# Patient Record
Sex: Male | Born: 1950
Health system: Southern US, Community
[De-identification: ages and names within clinical notes are randomized; demographics above are authoritative.]

## PROBLEM LIST (undated history)

## (undated) DIAGNOSIS — K589 Irritable bowel syndrome without diarrhea: Secondary | ICD-10-CM

## (undated) DIAGNOSIS — K219 Gastro-esophageal reflux disease without esophagitis: Secondary | ICD-10-CM

## (undated) DIAGNOSIS — N529 Male erectile dysfunction, unspecified: Secondary | ICD-10-CM

## (undated) DIAGNOSIS — E785 Hyperlipidemia, unspecified: Secondary | ICD-10-CM

## (undated) DIAGNOSIS — H9190 Unspecified hearing loss, unspecified ear: Secondary | ICD-10-CM

## (undated) DIAGNOSIS — I340 Nonrheumatic mitral (valve) insufficiency: Secondary | ICD-10-CM

## (undated) DIAGNOSIS — C801 Malignant (primary) neoplasm, unspecified: Secondary | ICD-10-CM

## (undated) DIAGNOSIS — I341 Nonrheumatic mitral (valve) prolapse: Secondary | ICD-10-CM

## (undated) DIAGNOSIS — Z9889 Other specified postprocedural states: Secondary | ICD-10-CM

## (undated) DIAGNOSIS — M5020 Other cervical disc displacement, unspecified cervical region: Secondary | ICD-10-CM

## (undated) DIAGNOSIS — R911 Solitary pulmonary nodule: Secondary | ICD-10-CM

## (undated) DIAGNOSIS — R011 Cardiac murmur, unspecified: Secondary | ICD-10-CM

## (undated) HISTORY — DX: Gastro-esophageal reflux disease without esophagitis: K21.9

## (undated) HISTORY — DX: Irritable bowel syndrome, unspecified: K58.9

## (undated) HISTORY — DX: Hyperlipidemia, unspecified: E78.5

## (undated) HISTORY — PX: TONSILLECTOMY: SUR1361

## (undated) HISTORY — DX: Unspecified hearing loss, unspecified ear: H91.90

## (undated) HISTORY — DX: Male erectile dysfunction, unspecified: N52.9

## (undated) HISTORY — DX: Nonrheumatic mitral (valve) prolapse: I34.1

## (undated) HISTORY — DX: Nonrheumatic mitral (valve) insufficiency: I34.0

## (undated) HISTORY — DX: Malignant (primary) neoplasm, unspecified: C80.1

---
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
## 1978-02-04 HISTORY — PX: ACHILLES TENDON SURGERY: SHX542

## 1998-05-30 ENCOUNTER — Other Ambulatory Visit: Admission: RE | Admit: 1998-05-30 | Discharge: 1998-05-30 | Payer: Self-pay | Admitting: Internal Medicine

## 2004-02-14 ENCOUNTER — Ambulatory Visit: Payer: Self-pay | Admitting: Internal Medicine

## 2004-02-21 ENCOUNTER — Ambulatory Visit: Payer: Self-pay | Admitting: Internal Medicine

## 2004-08-15 ENCOUNTER — Ambulatory Visit: Payer: Self-pay | Admitting: Internal Medicine

## 2004-08-22 ENCOUNTER — Ambulatory Visit: Payer: Self-pay | Admitting: Internal Medicine

## 2004-09-27 ENCOUNTER — Ambulatory Visit: Payer: Self-pay | Admitting: Internal Medicine

## 2004-11-30 ENCOUNTER — Ambulatory Visit: Payer: Self-pay | Admitting: Internal Medicine

## 2004-12-05 HISTORY — PX: AMPUTATION FINGER / THUMB: SUR24

## 2004-12-22 ENCOUNTER — Emergency Department (HOSPITAL_COMMUNITY): Admission: EM | Admit: 2004-12-22 | Discharge: 2004-12-22 | Payer: Self-pay | Admitting: Emergency Medicine

## 2005-01-04 ENCOUNTER — Ambulatory Visit (HOSPITAL_COMMUNITY): Admission: RE | Admit: 2005-01-04 | Discharge: 2005-01-04 | Payer: Self-pay | Admitting: *Deleted

## 2005-01-18 ENCOUNTER — Ambulatory Visit (HOSPITAL_COMMUNITY): Admission: RE | Admit: 2005-01-18 | Discharge: 2005-01-18 | Payer: Self-pay

## 2005-02-02 ENCOUNTER — Emergency Department (HOSPITAL_COMMUNITY): Admission: EM | Admit: 2005-02-02 | Discharge: 2005-02-02 | Payer: Self-pay | Admitting: Emergency Medicine

## 2005-02-04 HISTORY — PX: PROSTATECTOMY: SHX69

## 2005-07-16 ENCOUNTER — Ambulatory Visit: Payer: Self-pay | Admitting: Internal Medicine

## 2005-07-24 ENCOUNTER — Ambulatory Visit: Payer: Self-pay | Admitting: Internal Medicine

## 2005-10-23 ENCOUNTER — Inpatient Hospital Stay (HOSPITAL_COMMUNITY): Admission: RE | Admit: 2005-10-23 | Discharge: 2005-10-25 | Payer: Self-pay | Admitting: Urology

## 2005-10-23 ENCOUNTER — Encounter (INDEPENDENT_AMBULATORY_CARE_PROVIDER_SITE_OTHER): Payer: Self-pay | Admitting: *Deleted

## 2005-12-06 ENCOUNTER — Ambulatory Visit: Payer: Self-pay | Admitting: Internal Medicine

## 2006-02-04 DIAGNOSIS — M5020 Other cervical disc displacement, unspecified cervical region: Secondary | ICD-10-CM

## 2006-02-04 HISTORY — DX: Other cervical disc displacement, unspecified cervical region: M50.20

## 2006-07-23 ENCOUNTER — Ambulatory Visit: Payer: Self-pay | Admitting: Internal Medicine

## 2006-07-23 LAB — CONVERTED CEMR LAB
ALT: 14 units/L (ref 0–40)
AST: 17 units/L (ref 0–37)
Albumin: 4.2 g/dL (ref 3.5–5.2)
Alkaline Phosphatase: 41 units/L (ref 39–117)
BUN: 11 mg/dL (ref 6–23)
Basophils Absolute: 0 10*3/uL (ref 0.0–0.1)
Basophils Relative: 0.8 % (ref 0.0–1.0)
Bilirubin Urine: NEGATIVE
Bilirubin, Direct: 0.1 mg/dL (ref 0.0–0.3)
CO2: 33 meq/L — ABNORMAL HIGH (ref 19–32)
Calcium: 9.2 mg/dL (ref 8.4–10.5)
Chloride: 108 meq/L (ref 96–112)
Cholesterol: 157 mg/dL (ref 0–200)
Creatinine, Ser: 0.9 mg/dL (ref 0.4–1.5)
Eosinophils Absolute: 0.3 10*3/uL (ref 0.0–0.6)
Eosinophils Relative: 5.5 % — ABNORMAL HIGH (ref 0.0–5.0)
GFR calc Af Amer: 112 mL/min
GFR calc non Af Amer: 92 mL/min
Glucose, Bld: 96 mg/dL (ref 70–99)
HCT: 43.4 % (ref 39.0–52.0)
HDL: 43.2 mg/dL (ref >39.0)
Hemoglobin, Urine: NEGATIVE
Hemoglobin: 14.6 g/dL (ref 13.0–17.0)
Ketones, ur: NEGATIVE mg/dL
LDL Cholesterol: 98 mg/dL (ref 0–99)
Leukocytes, UA: NEGATIVE
Lymphocytes Relative: 33.2 % (ref 12.0–46.0)
MCHC: 33.6 g/dL (ref 30.0–36.0)
MCV: 89.6 fL (ref 78.0–100.0)
Monocytes Absolute: 0.5 10*3/uL (ref 0.2–0.7)
Monocytes Relative: 8.3 % (ref 3.0–11.0)
Neutro Abs: 2.9 10*3/uL (ref 1.4–7.7)
Neutrophils Relative %: 52.2 % (ref 43.0–77.0)
Nitrite: NEGATIVE
PSA: <0.03 ng/mL — ABNORMAL LOW (ref 0.10–4.00)
Platelets: 178 10*3/uL (ref 150–400)
Potassium: 4.5 meq/L (ref 3.5–5.1)
RBC: 4.85 M/uL (ref 4.22–5.81)
RDW: 12.2 % (ref 11.5–14.6)
Sodium: 142 meq/L (ref 135–145)
Specific Gravity, Urine: <=1.005 (ref 1.000–1.03)
TSH: 1.07 microintl units/mL (ref 0.35–5.50)
Total Bilirubin: 0.8 mg/dL (ref 0.3–1.2)
Total CHOL/HDL Ratio: 3.6
Total Protein, Urine: NEGATIVE mg/dL
Total Protein: 6.4 g/dL (ref 6.0–8.3)
Triglycerides: 79 mg/dL (ref 0–149)
Urine Glucose: NEGATIVE mg/dL
Urobilinogen, UA: 0.2 (ref 0.0–1.0)
VLDL: 16 mg/dL (ref 0–40)
WBC: 5.5 10*3/uL (ref 4.5–10.5)
pH: 6.5 (ref 5.0–8.0)

## 2006-07-31 ENCOUNTER — Ambulatory Visit: Payer: Self-pay | Admitting: Internal Medicine

## 2006-10-10 ENCOUNTER — Ambulatory Visit: Payer: Self-pay | Admitting: Internal Medicine

## 2006-12-02 ENCOUNTER — Encounter: Payer: Self-pay | Admitting: Internal Medicine

## 2007-07-21 ENCOUNTER — Ambulatory Visit: Payer: Self-pay | Admitting: Internal Medicine

## 2007-07-21 LAB — CONVERTED CEMR LAB
ALT: 15 units/L (ref 0–53)
AST: 19 units/L (ref 0–37)
Albumin: 4.3 g/dL (ref 3.5–5.2)
Alkaline Phosphatase: 46 units/L (ref 39–117)
BUN: 12 mg/dL (ref 6–23)
Basophils Absolute: 0 10*3/uL (ref 0.0–0.1)
Basophils Relative: 1 % (ref 0.0–1.0)
Bilirubin Urine: NEGATIVE
Bilirubin, Direct: 0.2 mg/dL (ref 0.0–0.3)
CO2: 29 meq/L (ref 19–32)
Calcium: 9.4 mg/dL (ref 8.4–10.5)
Chloride: 106 meq/L (ref 96–112)
Cholesterol: 142 mg/dL (ref 0–200)
Creatinine, Ser: 0.9 mg/dL (ref 0.4–1.5)
Eosinophils Absolute: 0.2 10*3/uL (ref 0.0–0.7)
Eosinophils Relative: 4.5 % (ref 0.0–5.0)
GFR calc Af Amer: 111 mL/min
GFR calc non Af Amer: 92 mL/min
Glucose, Bld: 89 mg/dL (ref 70–99)
HCT: 42.2 % (ref 39.0–52.0)
HDL: 43.4 mg/dL (ref >39.0)
Hemoglobin, Urine: NEGATIVE
Hemoglobin: 14.4 g/dL (ref 13.0–17.0)
Ketones, ur: NEGATIVE mg/dL
LDL Cholesterol: 86 mg/dL (ref 0–99)
Leukocytes, UA: NEGATIVE
Lymphocytes Relative: 31 % (ref 12.0–46.0)
MCHC: 34.1 g/dL (ref 30.0–36.0)
MCV: 89.3 fL (ref 78.0–100.0)
Monocytes Absolute: 0.4 10*3/uL (ref 0.1–1.0)
Monocytes Relative: 8.3 % (ref 3.0–12.0)
Neutro Abs: 2.5 10*3/uL (ref 1.4–7.7)
Neutrophils Relative %: 55.2 % (ref 43.0–77.0)
Nitrite: NEGATIVE
PSA: 0.01 ng/mL — ABNORMAL LOW (ref 0.10–4.00)
Platelets: 176 10*3/uL (ref 150–400)
Potassium: 4 meq/L (ref 3.5–5.1)
RBC: 4.73 M/uL (ref 4.22–5.81)
RDW: 12.9 % (ref 11.5–14.6)
Sodium: 141 meq/L (ref 135–145)
Specific Gravity, Urine: 1.01 (ref 1.000–1.03)
TSH: 0.96 microintl units/mL (ref 0.35–5.50)
Total Bilirubin: 1.3 mg/dL — ABNORMAL HIGH (ref 0.3–1.2)
Total CHOL/HDL Ratio: 3.3
Total Protein, Urine: NEGATIVE mg/dL
Total Protein: 6.5 g/dL (ref 6.0–8.3)
Triglycerides: 63 mg/dL (ref 0–149)
Urine Glucose: NEGATIVE mg/dL
Urobilinogen, UA: 0.2 (ref 0.0–1.0)
VLDL: 13 mg/dL (ref 0–40)
WBC: 4.5 10*3/uL (ref 4.5–10.5)
pH: 6.5 (ref 5.0–8.0)

## 2007-07-28 ENCOUNTER — Ambulatory Visit: Payer: Self-pay | Admitting: Internal Medicine

## 2007-07-28 DIAGNOSIS — Z8546 Personal history of malignant neoplasm of prostate: Secondary | ICD-10-CM | POA: Insufficient documentation

## 2007-07-28 DIAGNOSIS — N529 Male erectile dysfunction, unspecified: Secondary | ICD-10-CM | POA: Insufficient documentation

## 2007-07-28 DIAGNOSIS — K219 Gastro-esophageal reflux disease without esophagitis: Secondary | ICD-10-CM | POA: Insufficient documentation

## 2007-07-28 DIAGNOSIS — E785 Hyperlipidemia, unspecified: Secondary | ICD-10-CM | POA: Insufficient documentation

## 2007-07-28 DIAGNOSIS — J309 Allergic rhinitis, unspecified: Secondary | ICD-10-CM | POA: Insufficient documentation

## 2007-07-28 DIAGNOSIS — Z85828 Personal history of other malignant neoplasm of skin: Secondary | ICD-10-CM | POA: Insufficient documentation

## 2007-11-20 ENCOUNTER — Ambulatory Visit: Payer: Self-pay | Admitting: Internal Medicine

## 2008-01-18 ENCOUNTER — Ambulatory Visit: Payer: Self-pay | Admitting: Internal Medicine

## 2008-07-01 ENCOUNTER — Encounter (INDEPENDENT_AMBULATORY_CARE_PROVIDER_SITE_OTHER): Payer: Self-pay | Admitting: *Deleted

## 2008-07-20 ENCOUNTER — Ambulatory Visit: Payer: Self-pay | Admitting: Internal Medicine

## 2008-07-20 LAB — CONVERTED CEMR LAB
ALT: 14 units/L (ref 0–53)
AST: 21 units/L (ref 0–37)
Albumin: 4.3 g/dL (ref 3.5–5.2)
Alkaline Phosphatase: 44 units/L (ref 39–117)
BUN: 9 mg/dL (ref 6–23)
Basophils Absolute: 0 10*3/uL (ref 0.0–0.1)
Basophils Relative: 0.4 % (ref 0.0–3.0)
Bilirubin Urine: NEGATIVE
Bilirubin, Direct: 0.2 mg/dL (ref 0.0–0.3)
CO2: 33 meq/L — ABNORMAL HIGH (ref 19–32)
Calcium: 9.3 mg/dL (ref 8.4–10.5)
Chloride: 103 meq/L (ref 96–112)
Cholesterol: 145 mg/dL (ref 0–200)
Creatinine, Ser: 0.9 mg/dL (ref 0.4–1.5)
Eosinophils Absolute: 0.2 10*3/uL (ref 0.0–0.7)
Eosinophils Relative: 5.2 % — ABNORMAL HIGH (ref 0.0–5.0)
GFR calc non Af Amer: 91.6 mL/min (ref >60)
Glucose, Bld: 94 mg/dL (ref 70–99)
HCT: 41.4 % (ref 39.0–52.0)
HDL: 45.9 mg/dL (ref >39.00)
Hemoglobin, Urine: NEGATIVE
Hemoglobin: 14.5 g/dL (ref 13.0–17.0)
Ketones, ur: NEGATIVE mg/dL
LDL Cholesterol: 84 mg/dL (ref 0–99)
Leukocytes, UA: NEGATIVE
Lymphocytes Relative: 28.1 % (ref 12.0–46.0)
Lymphs Abs: 1.2 10*3/uL (ref 0.7–4.0)
MCHC: 35 g/dL (ref 30.0–36.0)
MCV: 88.8 fL (ref 78.0–100.0)
Monocytes Absolute: 0.3 10*3/uL (ref 0.1–1.0)
Monocytes Relative: 6.2 % (ref 3.0–12.0)
Neutro Abs: 2.7 10*3/uL (ref 1.4–7.7)
Neutrophils Relative %: 60.1 % (ref 43.0–77.0)
Nitrite: NEGATIVE
PSA: 0.01 ng/mL — ABNORMAL LOW (ref 0.10–4.00)
Platelets: 165 10*3/uL (ref 150.0–400.0)
Potassium: 4.3 meq/L (ref 3.5–5.1)
RBC: 4.66 M/uL (ref 4.22–5.81)
RDW: 12.6 % (ref 11.5–14.6)
Sodium: 144 meq/L (ref 135–145)
Specific Gravity, Urine: <=1.005 (ref 1.000–1.030)
TSH: 1.03 microintl units/mL (ref 0.35–5.50)
Total Bilirubin: 1.3 mg/dL — ABNORMAL HIGH (ref 0.3–1.2)
Total CHOL/HDL Ratio: 3
Total Protein, Urine: NEGATIVE mg/dL
Total Protein: 6.7 g/dL (ref 6.0–8.3)
Triglycerides: 75 mg/dL (ref 0.0–149.0)
Urine Glucose: NEGATIVE mg/dL
Urobilinogen, UA: 0.2 (ref 0.0–1.0)
VLDL: 15 mg/dL (ref 0.0–40.0)
WBC: 4.4 10*3/uL — ABNORMAL LOW (ref 4.5–10.5)
pH: 7.5 (ref 5.0–8.0)

## 2008-07-28 ENCOUNTER — Ambulatory Visit: Payer: Self-pay | Admitting: Internal Medicine

## 2008-11-03 ENCOUNTER — Ambulatory Visit: Payer: Self-pay | Admitting: Internal Medicine

## 2008-12-06 ENCOUNTER — Encounter: Payer: Self-pay | Admitting: Internal Medicine

## 2008-12-23 ENCOUNTER — Encounter: Payer: Self-pay | Admitting: Internal Medicine

## 2009-03-20 ENCOUNTER — Ambulatory Visit: Payer: Self-pay | Admitting: Internal Medicine

## 2009-03-20 DIAGNOSIS — J069 Acute upper respiratory infection, unspecified: Secondary | ICD-10-CM | POA: Insufficient documentation

## 2009-03-23 ENCOUNTER — Telehealth: Payer: Self-pay | Admitting: Internal Medicine

## 2009-03-27 ENCOUNTER — Ambulatory Visit: Payer: Self-pay | Admitting: Internal Medicine

## 2009-03-27 DIAGNOSIS — J019 Acute sinusitis, unspecified: Secondary | ICD-10-CM | POA: Insufficient documentation

## 2009-04-11 ENCOUNTER — Ambulatory Visit: Payer: Self-pay | Admitting: Internal Medicine

## 2009-04-11 DIAGNOSIS — R509 Fever, unspecified: Secondary | ICD-10-CM | POA: Insufficient documentation

## 2009-04-11 DIAGNOSIS — R05 Cough: Secondary | ICD-10-CM

## 2009-04-11 DIAGNOSIS — R059 Cough, unspecified: Secondary | ICD-10-CM | POA: Insufficient documentation

## 2009-04-13 ENCOUNTER — Telehealth: Payer: Self-pay | Admitting: Internal Medicine

## 2009-04-13 LAB — CONVERTED CEMR LAB
ALT: 22 units/L (ref 0–53)
AST: 22 units/L (ref 0–37)
Albumin: 3.9 g/dL (ref 3.5–5.2)
Alkaline Phosphatase: 62 units/L (ref 39–117)
BUN: 8 mg/dL (ref 6–23)
Basophils Absolute: 0 10*3/uL (ref 0.0–0.1)
Basophils Relative: 0.6 % (ref 0.0–3.0)
Bilirubin Urine: NEGATIVE
Bilirubin, Direct: 0.2 mg/dL (ref 0.0–0.3)
CO2: 31 meq/L (ref 19–32)
Calcium: 9.3 mg/dL (ref 8.4–10.5)
Chloride: 106 meq/L (ref 96–112)
Creatinine, Ser: 0.8 mg/dL (ref 0.4–1.5)
Eosinophils Absolute: 0.1 10*3/uL (ref 0.0–0.7)
Eosinophils Relative: 2.8 % (ref 0.0–5.0)
GFR calc non Af Amer: 104.68 mL/min (ref >60)
Glucose, Bld: 94 mg/dL (ref 70–99)
HCT: 39 % (ref 39.0–52.0)
Hemoglobin, Urine: NEGATIVE
Hemoglobin: 13.1 g/dL (ref 13.0–17.0)
Ketones, ur: NEGATIVE mg/dL
Leukocytes, UA: NEGATIVE
Lymphocytes Relative: 22.4 % (ref 12.0–46.0)
Lymphs Abs: 1 10*3/uL (ref 0.7–4.0)
MCHC: 33.6 g/dL (ref 30.0–36.0)
MCV: 89.9 fL (ref 78.0–100.0)
Monocytes Absolute: 0.5 10*3/uL (ref 0.1–1.0)
Monocytes Relative: 11.6 % (ref 3.0–12.0)
Neutro Abs: 2.9 10*3/uL (ref 1.4–7.7)
Neutrophils Relative %: 62.6 % (ref 43.0–77.0)
Nitrite: NEGATIVE
Platelets: 174 10*3/uL (ref 150.0–400.0)
Potassium: 4.2 meq/L (ref 3.5–5.1)
RBC: 4.34 M/uL (ref 4.22–5.81)
RDW: 12 % (ref 11.5–14.6)
Sed Rate: 34 mm/hr — ABNORMAL HIGH (ref 0–22)
Sodium: 144 meq/L (ref 135–145)
Specific Gravity, Urine: 1.01 (ref 1.000–1.030)
TSH: 2.27 microintl units/mL (ref 0.35–5.50)
Total Bilirubin: 0.8 mg/dL (ref 0.3–1.2)
Total Protein, Urine: NEGATIVE mg/dL
Total Protein: 7.2 g/dL (ref 6.0–8.3)
Urine Glucose: NEGATIVE mg/dL
Urobilinogen, UA: 0.2 (ref 0.0–1.0)
WBC: 4.5 10*3/uL (ref 4.5–10.5)
pH: 6 (ref 5.0–8.0)

## 2009-07-17 ENCOUNTER — Ambulatory Visit: Payer: Self-pay | Admitting: Internal Medicine

## 2009-07-17 LAB — CONVERTED CEMR LAB
ALT: 15 units/L (ref 0–53)
AST: 20 units/L (ref 0–37)
Albumin: 4.2 g/dL (ref 3.5–5.2)
Alkaline Phosphatase: 42 units/L (ref 39–117)
BUN: 11 mg/dL (ref 6–23)
Basophils Absolute: 0 10*3/uL (ref 0.0–0.1)
Basophils Relative: 0.6 % (ref 0.0–3.0)
Bilirubin Urine: NEGATIVE
Bilirubin, Direct: 0.1 mg/dL (ref 0.0–0.3)
CO2: 31 meq/L (ref 19–32)
Calcium: 9.2 mg/dL (ref 8.4–10.5)
Chloride: 108 meq/L (ref 96–112)
Cholesterol: 155 mg/dL (ref 0–200)
Creatinine, Ser: 0.9 mg/dL (ref 0.4–1.5)
Eosinophils Absolute: 0.2 10*3/uL (ref 0.0–0.7)
Eosinophils Relative: 4.4 % (ref 0.0–5.0)
GFR calc non Af Amer: 93.7 mL/min (ref >60)
Glucose, Bld: 89 mg/dL (ref 70–99)
HCT: 42.8 % (ref 39.0–52.0)
HDL: 47 mg/dL (ref >39.00)
Hemoglobin, Urine: NEGATIVE
Hemoglobin: 14.7 g/dL (ref 13.0–17.0)
Ketones, ur: NEGATIVE mg/dL
LDL Cholesterol: 95 mg/dL (ref 0–99)
Leukocytes, UA: NEGATIVE
Lymphocytes Relative: 31.3 % (ref 12.0–46.0)
Lymphs Abs: 1.5 10*3/uL (ref 0.7–4.0)
MCHC: 34.3 g/dL (ref 30.0–36.0)
MCV: 88.1 fL (ref 78.0–100.0)
Monocytes Absolute: 0.3 10*3/uL (ref 0.1–1.0)
Monocytes Relative: 6.9 % (ref 3.0–12.0)
Neutro Abs: 2.6 10*3/uL (ref 1.4–7.7)
Neutrophils Relative %: 56.8 % (ref 43.0–77.0)
Nitrite: NEGATIVE
PSA: 0.01 ng/mL — ABNORMAL LOW (ref 0.10–4.00)
Platelets: 158 10*3/uL (ref 150.0–400.0)
Potassium: 4.4 meq/L (ref 3.5–5.1)
RBC: 4.86 M/uL (ref 4.22–5.81)
RDW: 13.6 % (ref 11.5–14.6)
Sodium: 144 meq/L (ref 135–145)
Specific Gravity, Urine: 1.015 (ref 1.000–1.030)
TSH: 1.18 microintl units/mL (ref 0.35–5.50)
Total Bilirubin: 0.8 mg/dL (ref 0.3–1.2)
Total CHOL/HDL Ratio: 3
Total Protein, Urine: NEGATIVE mg/dL
Total Protein: 6.2 g/dL (ref 6.0–8.3)
Triglycerides: 63 mg/dL (ref 0.0–149.0)
Urine Glucose: NEGATIVE mg/dL
Urobilinogen, UA: 0.2 (ref 0.0–1.0)
VLDL: 12.6 mg/dL (ref 0.0–40.0)
WBC: 4.7 10*3/uL (ref 4.5–10.5)
pH: 6.5 (ref 5.0–8.0)

## 2009-07-24 ENCOUNTER — Ambulatory Visit: Payer: Self-pay | Admitting: Internal Medicine

## 2009-10-23 ENCOUNTER — Ambulatory Visit: Payer: Self-pay | Admitting: Internal Medicine

## 2009-12-01 ENCOUNTER — Encounter: Payer: Self-pay | Admitting: Internal Medicine

## 2010-01-16 ENCOUNTER — Ambulatory Visit: Payer: Self-pay | Admitting: Internal Medicine

## 2010-02-24 ENCOUNTER — Encounter: Payer: Self-pay | Admitting: *Deleted

## 2010-03-06 NOTE — Assessment & Plan Note (Signed)
Summary: NOT FEELING WELL/CD   Vital Signs:  Patient profile:   60 year old male Weight:      160 pounds Temp:     98.6 degrees F oral Pulse rate:   88 / minute BP sitting:   90 / 60  (left arm)  Vitals Entered By: Tora Perches (March 20, 2009 10:06 AM) CC: not feeling well Is Patient Diabetic? No   CC:  not feeling well.  History of Present Illness: The patient presents with complaints of sore throat, fever, cough, sinus congestion and drainge of several days duration. Not better with OTC meds. Chest hurts less with coughing. Can't sleep due to cough. Muscle aches are present.  The mucus is colored.   Preventive Screening-Counseling & Management  Alcohol-Tobacco     Smoking Status: never  Current Medications (verified): 1)  Prevacid 30 Mg  Cpdr (Lansoprazole) .... Once Daily 2)  Glycopyrrolate 2 Mg  Tabs (Glycopyrrolate) .... Once Daily 3)  Lovastatin 20 Mg  Tabs (Lovastatin) .... Once Daily 4)  Vitamin D3 1000 Unit  Tabs (Cholecalciferol) .Marland Kitchen.. 1 By Mouth Daily  Allergies: 1)  ! Vicodin (Hydrocodone-Acetaminophen)  Physical Exam  General:  NAD Mouth:  Erythematous throat mucosa and intranasal erythema.  Lungs:  CTA Heart:  RRR Abdomen:  S/NT Skin:  Clear   Impression & Recommendations:  Problem # 1:  UPPER RESPIRATORY INFECTION, ACUTE (ICD-465.9) Assessment New  His updated medication list for this problem includes:    Promethazine-codeine 6.25-10 Mg/4ml Syrp (Promethazine-codeine) .Marland Kitchen... 5-10 ml by mouth q id as needed cough Zpac if worse  Complete Medication List: 1)  Prevacid 30 Mg Cpdr (Lansoprazole) .... Once daily 2)  Glycopyrrolate 2 Mg Tabs (Glycopyrrolate) .... Once daily 3)  Lovastatin 20 Mg Tabs (Lovastatin) .... Once daily 4)  Vitamin D3 1000 Unit Tabs (Cholecalciferol) .Marland Kitchen.. 1 by mouth daily 5)  Zithromax Z-pak 250 Mg Tabs (Azithromycin) .... As dirrected 6)  Promethazine-codeine 6.25-10 Mg/71ml Syrp (Promethazine-codeine) .... 5-10 ml by  mouth q id as needed cough  Patient Instructions: 1)  Use over-the-counter medicines for "cold": Tylenol  650mg  or Advil 400mg  every 6 hours  for fever; Delsym or Robutussin for cough. Mucinex or Mucinex D for congestion. Ricola or Halls for sore throat. Office visit if not better or if worse.  Prescriptions: PROMETHAZINE-CODEINE 6.25-10 MG/5ML SYRP (PROMETHAZINE-CODEINE) 5-10 ml by mouth q id as needed cough  #300 ml x 0   Entered and Authorized by:   Tresa Garter MD   Signed by:   Tresa Garter MD on 03/20/2009   Method used:   Print then Give to Patient   RxID:   [id number] ZITHROMAX Z-PAK 250 MG TABS (AZITHROMYCIN) as dirrected  #1 x 0   Entered and Authorized by:   Tresa Garter MD   Signed by:   Tresa Garter MD on 03/20/2009   Method used:   Print then Give to Patient   RxID:   [id number]

## 2010-03-06 NOTE — Assessment & Plan Note (Signed)
Summary: cont'd cold,cough/leaving triage message also/cd   Vital Signs:  Patient profile:   60 year old male Weight:      159 pounds O2 Sat:      99 % Temp:     98 degrees F oral Pulse rate:   76 / minute BP sitting:   92 / 60  (left arm)  Vitals Entered By: Tora Perches (April 11, 2009 9:27 AM) CC: ongoing cold sx, cough  Is Patient Diabetic? No   CC:  ongoing cold sx and cough .  History of Present Illness: Was doing well on Avelox. Now is having a dry cough and chills w/ a fever in pm; sinus pressure x 2-3 d. No CP, SOB, abd pain or rashes.  Preventive Screening-Counseling & Management  Alcohol-Tobacco     Smoking Status: never  Current Medications (verified): 1)  Prevacid 30 Mg  Cpdr (Lansoprazole) .... Once Daily 2)  Glycopyrrolate 2 Mg  Tabs (Glycopyrrolate) .... Once Daily 3)  Lovastatin 20 Mg  Tabs (Lovastatin) .... Once Daily 4)  Vitamin D3 1000 Unit  Tabs (Cholecalciferol) .Marland Kitchen.. 1 By Mouth Daily  Allergies: 1)  ! Vicodin (Hydrocodone-Acetaminophen)  Past History:  Past Medical History: Last updated: 07/28/2007 Allergic rhinitis Prostate cancer, hx of GERD IBS Skin cancer, hx of basal cell ED Hyperlipidemia  Social History: Married Never Smoked Retired Alcohol use-no  Review of Systems       The patient complains of fever and prolonged cough.  The patient denies anorexia, weight loss, vision loss, hoarseness, chest pain, abdominal pain, hematochezia, genital sores, muscle weakness, and difficulty walking.    Physical Exam  General:  NAD Eyes:  No corneal or conjunctival inflammation noted. EOMI. Perrla Nose:  Erythematous throat mucosa and intranasal erythema.  Lungs:  CTAnormal respiratory effort and no wheezes.   Heart:  RRRno murmur, no gallop, and no rub.   Abdomen:  S/NT no hepatomegaly and no splenomegaly.   Msk:  No deformity or scoliosis noted of thoracic or lumbar spine.   Extremities:  No clubbing, cyanosis, edema, or deformity  noted with normal full range of motion of all joints.   Skin:  Clear Cervical Nodes:  No lymphadenopathy noted Inguinal Nodes:  No significant adenopathy Psych:  Oriented X3.     Impression & Recommendations:  Problem # 1:  FEVER UNSPECIFIED (ICD-780.60) unclear etiol. Blood cx is pending - will watch and use as needed Tylenol. Record T three times a day  Orders: T-Blood Culture [phone]) T-2 View CXR, Same Day (71020.5TC) TLB-Sedimentation Rate (ESR) (85652-ESR) TLB-BMP (Basic Metabolic Panel-BMET) (80048-METABOL) TLB-CBC Platelet - w/Differential (85025-CBCD) TLB-Hepatic/Liver Function Pnl (80076-HEPATIC) TLB-TSH (Thyroid Stimulating Hormone) (84443-TSH) TLB-Udip ONLY (81003-UDIP)  Problem # 2:  COUGH (ICD-786.2) Assessment: New  Orders: T-Blood Culture [id number]) T-2 View CXR, Same Day (71020.5TC) TLB-Sedimentation Rate (ESR) (85652-ESR) TLB-BMP (Basic Metabolic Panel-BMET) (80048-METABOL) TLB-CBC Platelet - w/Differential (85025-CBCD) TLB-Hepatic/Liver Function Pnl (80076-HEPATIC) TLB-TSH (Thyroid Stimulating Hormone) (84443-TSH) TLB-Udip ONLY (81003-UDIP)  Problem # 3:  SINUSITIS, ACUTE (ICD-461.9) just treated Assessment: Comment Only CT sinuses if not better The following medications were removed from the medication list:    Promethazine-codeine 6.25-10 Mg/30ml Syrp (Promethazine-codeine) .Marland Kitchen... 5-10 ml by mouth q id as needed cough  Problem # 4:  UPPER RESPIRATORY INFECTION, ACUTE (ICD-465.9)?? new again Assessment: Comment Only  The following medications were removed from the medication list:    Promethazine-codeine 6.25-10 Mg/59ml Syrp (Promethazine-codeine) .Marland Kitchen... 5-10 ml by mouth q id as needed cough  Complete Medication List: 1)  Prevacid 30 Mg Cpdr (Lansoprazole) .... Once daily 2)  Glycopyrrolate 2 Mg Tabs (Glycopyrrolate) .... Once daily 3)  Lovastatin 20 Mg Tabs (Lovastatin) .... Once daily 4)  Vitamin D3 1000 Unit Tabs (Cholecalciferol) .Marland Kitchen.. 1 by  mouth daily  Patient Instructions: 1)  Call if you are not better in a reasonable amount of time or if worse. 2)  Please schedule a follow-up appointment in 1-2 weeks.

## 2010-03-06 NOTE — Miscellaneous (Signed)
Summary: Health Care POA  Health Care POA   Imported By: Sherian Rein 07/25/2009 15:47:43  _____________________________________________________________________  External Attachment:    Type:   Image     Comment:   External Document

## 2010-03-06 NOTE — Assessment & Plan Note (Signed)
Summary: PHYSICAL--STC   Vital Signs:  Patient profile:   60 year old male Height:      69 inches (175.26 cm) Weight:      155.25 pounds (70.57 kg) BMI:     23.01 O2 Sat:      96 % on Room air Temp:     97.0 degrees F (36.11 degrees C) oral Pulse rate:   69 / minute BP sitting:   122 / 80  (left arm) Cuff size:   regular  Vitals Entered By: Lucious Groves (July 24, 2009 10:29 AM)  O2 Flow:  Room air CC: CPX./kb Is Patient Diabetic? No Pain Assessment Patient in pain? no      Comments Patient needs refills of Prevacid, Glycopyrrolate, and Lovastatin faxed directly to [facility]./kb   CC:  CPX./kb.  History of Present Illness: The patient presents for a wellness examination   Current Medications (verified): 1)  Prevacid 30 Mg  Cpdr (Lansoprazole) .... Once Daily 2)  Glycopyrrolate 2 Mg  Tabs (Glycopyrrolate) .... Once Daily 3)  Lovastatin 20 Mg  Tabs (Lovastatin) .... Once Daily 4)  Vitamin D3 1000 Unit  Tabs (Cholecalciferol) .Marland Kitchen.. 1 By Mouth Daily 5)  Chlorpheniramine Maleate 2mg  Tabs (Otc) .... As Directed As Needed For Allergies  Allergies (verified): 1)  ! Vicodin (Hydrocodone-Acetaminophen)  Past History:  Past Medical History: Last updated: 07/28/2007 Allergic rhinitis Prostate cancer, hx of GERD IBS Skin cancer, hx of basal cell ED Hyperlipidemia  Family History: Last updated: 07/28/2007 Family History High cholesterol  Social History: Last updated: 04/11/2009 Married Never Smoked Retired Alcohol use-no  Review of Systems  The patient denies anorexia, fever, weight loss, weight gain, vision loss, decreased hearing, hoarseness, chest pain, syncope, dyspnea on exertion, peripheral edema, prolonged cough, headaches, hemoptysis, abdominal pain, melena, hematochezia, severe indigestion/heartburn, hematuria, incontinence, genital sores, muscle weakness, suspicious skin lesions, transient blindness, difficulty walking, depression, unusual weight change,  abnormal bleeding, enlarged lymph nodes, angioedema, and testicular masses.    Physical Exam  General:  NAD Head:  Normocephalic and atraumatic without obvious abnormalities. No apparent alopecia or balding. Eyes:  No corneal or conjunctival inflammation noted. EOMI. Perrla Ears:  External ear exam shows no significant lesions or deformities.  Otoscopic examination reveals clear canals, tympanic membranes are intact bilaterally without bulging, retraction, inflammation or discharge. Hearing is grossly normal bilaterally. Nose:  Erythematous throat mucosa and intranasal erythema.  Mouth:  Erythematous throat mucosa and intranasal erythema.  Neck:  No deformities, masses, or tenderness noted. Chest Wall:  No deformities, masses, tenderness or gynecomastia noted. Breasts:  No masses or gynecomastia noted Lungs:  Normal respiratory effort, chest expands symmetrically. Lungs are clear to auscultation, no crackles or wheezes. Heart:  Normal rate and regular rhythm. S1 and S2 normal without gallop, murmur, click, rub or other extra sounds. Abdomen:  Bowel sounds positive,abdomen soft and non-tender without masses, organomegaly or hernias noted. Rectal:  No external abnormalities noted. Normal sphincter tone. No rectal masses or tenderness. Genitalia:  Testes bilaterally descended without nodularity, tenderness or masses. No scrotal masses or lesions. No penis lesions or urethral discharge. Prostate:  absent Msk:  No deformity or scoliosis noted of thoracic or lumbar spine.   Pulses:  R and L carotid,radial,femoral,dorsalis pedis and posterior tibial pulses are full and equal bilaterally Extremities:  No clubbing, cyanosis, edema, or deformity noted with normal full range of motion of all joints.   Neurologic:  No cranial nerve deficits noted. Station and gait are normal. Plantar reflexes are down-going  bilaterally. DTRs are symmetrical throughout. Sensory, motor and coordinative functions appear  intact. Skin:  Intact without suspicious lesions or rashes Cervical Nodes:  No lymphadenopathy noted Inguinal Nodes:  No significant adenopathy Psych:  Cognition and judgment appear intact. Alert and cooperative with normal attention span and concentration. No apparent delusions, illusions, hallucinations   Impression & Recommendations:  Problem # 1:  WELL ADULT EXAM (ICD-V70.0) Assessment New The labs were reviewed with the patient.  Health and age related issues were discussed. Available screening tests and vaccinations were discussed as well. Healthy life style including good diet and execise was discussed.  Orders: EKG w/ Interpretation (93000)  Problem # 2:  PROSTATE CANCER, HX OF (ICD-V10.46) Assessment: Unchanged  Problem # 3:  HYPERLIPIDEMIA (ICD-272.4) Assessment: Unchanged  His updated medication list for this problem includes:    Lovastatin 20 Mg Tabs (Lovastatin) ..... Once daily  Problem # 4:  GERD (ICD-530.81) Assessment: Unchanged  His updated medication list for this problem includes:    Prevacid 30 Mg Cpdr (Lansoprazole) ..... Once daily    Glycopyrrolate 2 Mg Tabs (Glycopyrrolate) ..... Once daily  Complete Medication List: 1)  Prevacid 30 Mg Cpdr (Lansoprazole) .... Once daily 2)  Glycopyrrolate 2 Mg Tabs (Glycopyrrolate) .... Once daily 3)  Lovastatin 20 Mg Tabs (Lovastatin) .... Once daily 4)  Vitamin D3 1000 Unit Tabs (Cholecalciferol) .Marland Kitchen.. 1 by mouth daily 5)  Chlorpheniramine Maleate 2mg  Tabs (otc)  .... As directed as needed for allergies  Patient Instructions: 1)  Please schedule a follow-up appointment in 6 months. 2)  BMP prior to visit, ICD-9: 3)  Lipid Panel prior to visit, ICD-9: 995.20  272.20

## 2010-03-06 NOTE — Assessment & Plan Note (Signed)
Summary: FLU VAC AVP--STC  Nurse Visit   Allergies: 1)  ! Vicodin (Hydrocodone-Acetaminophen)  Orders Added: 1)  Admin 1st Vaccine [90471] 2)  Flu Vaccine 69yrs + [91478] Flu Vaccine Consent Questions     Do you have a history of severe allergic reactions to this vaccine? no    Any prior history of allergic reactions to egg and/or gelatin? no    Do you have a sensitivity to the preservative Thimersol? no    Do you have a past history of Guillan-Barre Syndrome? no    Do you currently have an acute febrile illness? no    Have you ever had a severe reaction to latex? no    Vaccine information given and explained to patient? yes    Are you currently pregnant? no    Lot Number:[id number]   Exp Date:08/04/2010   Site Given  Left Deltoid IM   .lbflu

## 2010-03-06 NOTE — Progress Notes (Signed)
Summary: Schedule Colonoscopy  Phone Note Outgoing Call Call back at Home Phone [phone]   Call placed by: Harlow Mares CMA Duncan Dull),  March 23, 2009 4:20 PM Call placed to: Patient Summary of Call: patient states that everytime his recall comes due he calls and says he is not getting his colonscopy done until 10 years. He states that he is sticking with the 10 year plan and he will not be having his colonoscopy before then.  Initial call taken by: Harlow Mares CMA Duncan Dull),  March 23, 2009 4:23 PM

## 2010-03-06 NOTE — Letter (Signed)
Summary: [facility]  [facility]   Imported By: Lennie Odor 12/20/2009 10:29:10  _____________________________________________________________________  External Attachment:    Type:   Image     Comment:   External Document

## 2010-03-06 NOTE — Assessment & Plan Note (Signed)
Summary: not better--cold symptoms--stc   Vital Signs:  Patient profile:   60 year old male Weight:      156 pounds Temp:     97 degrees F oral Pulse rate:   79 / minute BP sitting:   94 / 58  (left arm)  Vitals Entered By: Tora Perches (March 27, 2009 11:31 AM) CC: pt c/o still being no better---cold sx Is Patient Diabetic? No   CC:  pt c/o still being no better---cold sx.  History of Present Illness: The patient presents with complaints of sore throat, fever, cough, sinus congestion and drainge of several days duration - not better. Not better with OTC meds. Chest hurts with coughing. Can't sleep due to pressure in sinuses. Muscle aches are present.  The nasal mucus is colored.    Preventive Screening-Counseling & Management  Alcohol-Tobacco     Smoking Status: never  Current Medications (verified): 1)  Prevacid 30 Mg  Cpdr (Lansoprazole) .... Once Daily 2)  Glycopyrrolate 2 Mg  Tabs (Glycopyrrolate) .... Once Daily 3)  Lovastatin 20 Mg  Tabs (Lovastatin) .... Once Daily 4)  Vitamin D3 1000 Unit  Tabs (Cholecalciferol) .Marland Kitchen.. 1 By Mouth Daily 5)  Promethazine-Codeine 6.25-10 Mg/61ml Syrp (Promethazine-Codeine) .... 5-10 Ml By Mouth Q Id As Needed Cough  Allergies: 1)  ! Vicodin (Hydrocodone-Acetaminophen)  Past History:  Past Medical History: Last updated: 07/28/2007 Allergic rhinitis Prostate cancer, hx of GERD IBS Skin cancer, hx of basal cell ED Hyperlipidemia  Past Surgical History: Prostatectomy 2007  Achilles tendon prosthesis  Physical Exam  General:  NAD Mouth:  Erythematous throat mucosa and intranasal erythema.  Lungs:  CTA Heart:  RRR Abdomen:  S/NT Skin:  Clear   Impression & Recommendations:  Problem # 1:  SINUSITIS, ACUTE (ICD-461.9) Assessment Deteriorated Low risk of tendonitis/achilles tendon rupture discussed. Stop Rx if pain. His updated medication list for this problem includes:    Promethazine-codeine 6.25-10 Mg/71ml Syrp  (Promethazine-codeine) .Marland Kitchen... 5-10 ml by mouth q id as needed cough    Avelox 400 Mg Tabs (Moxifloxacin hcl) .Marland Kitchen... 1 by mouth once daily x 10 d  Complete Medication List: 1)  Prevacid 30 Mg Cpdr (Lansoprazole) .... Once daily 2)  Glycopyrrolate 2 Mg Tabs (Glycopyrrolate) .... Once daily 3)  Lovastatin 20 Mg Tabs (Lovastatin) .... Once daily 4)  Vitamin D3 1000 Unit Tabs (Cholecalciferol) .Marland Kitchen.. 1 by mouth daily 5)  Promethazine-codeine 6.25-10 Mg/57ml Syrp (Promethazine-codeine) .... 5-10 ml by mouth q id as needed cough 6)  Avelox 400 Mg Tabs (Moxifloxacin hcl) .Marland Kitchen.. 1 by mouth once daily x 10 d  Patient Instructions: 1)  Call if you are not better in a reasonable amount of time or if worse.  2)  Use the Sinus rinse as needed  Prescriptions: AVELOX 400 MG TABS (MOXIFLOXACIN HCL) 1 by mouth once daily x 10 d  #10 x 0   Entered and Authorized by:   Tresa Garter MD   Signed by:   Tresa Garter MD on 03/27/2009   Method used:   Electronically to        [facility]* (retail)       [address] [place], [place]  [address]       Ph: [phone] or [phone]       Fax: [phone]   RxID:   [id number]

## 2010-03-06 NOTE — Progress Notes (Signed)
Summary: results  Phone Note Call from Patient Call back at [facility] Phone [phone]   Summary of Call: calling for test results. Initial call taken by: Lucious Groves,  April 13, 2009 9:46 AM  Follow-up for Phone Call        spoke with pt, and informed him of his labS CXR, and Blood CX, pt states that he is feeling better, no fever, no chills Follow-up by: Sherese Christopher April 13, 2009 11:08 AM  Additional Follow-up for Phone Call Additional follow up Details #1::        Good! Plan - as before - OTC meds prn Additional Follow-up by: Tresa Garter MD,  April 13, 2009 12:58 PM    Additional Follow-up for Phone Call Additional follow up Details #2::    patient notified. Follow-up by: Lucious Groves,  April 13, 2009 1:44 PM

## 2010-06-19 NOTE — Assessment & Plan Note (Signed)
[facility]                           PRIMARY CARE OFFICE NOTE   NAME:[name], [name]                     MRN:          [id number]  DATE:07/31/2006                            DOB:          07/09/1950    The patient is a 60 year old male who presents for a wellness  examination.  Past medical history, family history, social history as  per July 19. 2006 note.  Last year he was diagnosed with prostate cancer  and underwent surgery, also had basal cell carcinoma removed from the  left arm by Dr. Yetta Barre.  He was also diagnosed with a bulged disk on the  cervical spine and treated with physical therapy by Dr. Otelia Sergeant.  Occasional problems with impotence, otherwise doing good.   CURRENT MEDICATIONS:  1. Robinul forte 2 mg one a day p.r.n.  2. Prevacid 30 mg daily.  3. Lovastatin 20 mg daily.  4. Allergy medicines at times.  5. Vitamin B complex.   REVIEW OF SYSTEMS:  No chest pain or shortness of breath, no syncope, no  neurologic complaints.  No urinary leakage.  No cervical pain or  numbness in the upper or lower extremities.  The rest of the 18-point  review of system is negative.   PHYSICAL EXAMINATION:  Blood pressure 123/77, pulse 68, temperature  97.0, weight 262 pounds.  He looks well.  HEENT:  With moist mucosa.  NECK:  Supple, no thyromegaly or bruit.  LUNGS:  Clear to auscultation and percussion, no wheezing or rales.  HEART:  S1-S2, no murmur, no gallop.  ABDOMEN:  Soft, nontender.  No organomegaly, no mass felt.  LOWER EXTREMITIES:  Without edema.  He is alert, oriented, and cooperative.  Denies being depressed.  RECTAL:  Examination not done today.  SKIN:  Examination reveals small mole measuring about 6 mm with a  regular color on the left side of his abdomen.   Labs on July 23, 2006:  CBC normal, CMET normal, cholesterol 157, TSH  normal, PSA less than 0.03.  EKG normal, urinalysis normal.   ASSESSMENT/PLAN:  1. Normal wellness  examination, age-related health issues discussed,      healthy lifestyle discussed.  He had chest x-ray in 2007 prior to      surgery, repeat exam in 12 months.  Colonoscopy up to date.  2. Erectile dysfunction.  I suggest using Viagra.  He can try L-      Arginine  for a supplement.  3. Basal cell carcinoma, left forearm, removed.  4. Mole on the abdominal wall was examined before by Dr. Yetta Barre, he      will follow up with him.  5. Cervical disk herniation C6-7, doing well now, continue with      stretching exercise.  He is on      vitamin B complex, he can take vitamin D3 in the cold season 1000      units daily.  6. Dyslipidemia on therapy.     Brendan Quint. Plotnikov, MD  Electronically Signed    AVP/MedQ  DD: 07/31/2006  DT: 07/31/2006  Job #: [id number]  cc:   Brendan Purpura, MD

## 2010-06-22 NOTE — H&P (Signed)
NAMEHOPE, HOLST NO.:  [id number]   MEDICAL RECORD NO.:  [id number]          PATIENT TYPE:  INP   LOCATION:  NA                           FACILITY:  [facility]   PHYSICIAN:  Heloise Purpura, MD      DATE OF BIRTH:  1950-11-16   DATE OF ADMISSION:  DATE OF DISCHARGE:                                HISTORY & PHYSICAL   CHIEF COMPLAINT:  Prostate cancer.   HISTORY:  Mr. Hornbaker is a 60 year old gentleman with clinical stage T1C  prostate cancer with a PSA of 4.62 and Gleason score of 3+3=6.  He underwent  a prostate biopsy on August 27, 2005.  Prostate gland was measured at 38.4 mL.  His biopsy demonstrated 10% of one core on the right side to be involved  with adenocarcinoma.  After discussing options regarding management for  clinically localized prostate cancer, the patient elected to proceed with  surgical therapy and a robotic-assisted laparoscopic radical prostatectomy.   PAST MEDICAL HISTORY:  1. History of severed tongue in 1960.  2. Ankle fracture in 1965.  3. Torn Achilles tendon in 1981.  4. Upper extremity infection after an arteriogram requiring antibiotics,      including Zyvox and vancomycin, for a resistant infection.  5. Irritable bowel syndrome.  6. Dyslipidemia.  7. Gastroesophageal reflux disease.   PAST SURGICAL HISTORY:  None.   MEDICATIONS:  1. Prevacid.  2. Lovastatin.  3. Robinul Forte.   ALLERGIES:  No known drug allergies.   FAMILY HISTORY:  Significant for coronary artery disease and hypertension.  There is no history of prostate cancer or GU malignancy.   SOCIAL HISTORY:  The patient is married with 2 children.  He is a retired  Research scientist (life sciences).  He denies tobacco use.  He drinks alcohol  occasionally.   PHYSICAL EXAMINATION:  CONSTITUTIONAL:  The patient is a  well-nourished,  well-developed, age-appropriate male in no acute distress.  CARDIOVASCULAR:  Regular rate and rhythm without obvious murmurs.  LUNGS:   Clear bilaterally.  ABDOMEN:  Soft, nontender, nondistended, without abdominal masses or bruits.  DRE:  No nodularity or induration.   IMPRESSION:  Low-risk clinically localized prostate cancer.   PLAN:  Mr. Vanderveen will undergo a robotic-assisted laparoscopic radical  prostatectomy and then be admitted to the hospital for routine postoperative  care.           ______________________________  Heloise Purpura, MD  Electronically Signed     LB/MEDQ  D:  10/23/2005  T:  10/24/2005  Job:  [id number]

## 2010-06-22 NOTE — Discharge Summary (Signed)
NAMEFREDERICK, Brendan [name] NO.:  [id number]   MEDICAL RECORD NO.:  [id number]          PATIENT TYPE:  INP   LOCATION:  1408                         FACILITY:  [facility]   PHYSICIAN:  Heloise Purpura, MD      DATE OF BIRTH:  1950/11/13   DATE OF ADMISSION:  10/23/2005  DATE OF DISCHARGE:  10/25/2005                                 DISCHARGE SUMMARY   ADMISSION DIAGNOSIS:  Prostate cancer.   DISCHARGE DIAGNOSIS:  Prostate cancer.   PROCEDURES.:  Robotic-assisted laparoscopic radical prostatectomy.   HISTORY:  For the full details of history and physical, please see admission  history and physical.  Brendan [name] is a 60 year old gentleman who has  clinical stage T1-2 prostate cancer, with a PSA of 4.62 and Gleason score of  3 + 3 = 6.  After discussion regarding management options for clinically  localized adenocarcinoma of the prostate, he elected to proceed with  surgical therapy.   HOSPITAL COURSE:  On October 23, 2005, the patient was taken to the  operating room and underwent a robotic-assisted laparoscopic radical  prostatectomy.  He tolerated this procedure well without complications.  Postoperatively, he was able to be transferred to a regular hospital room  following recovery from anesthesia.  He was able to begin ambulating the  evening of surgery.   On postoperative day #1, he was begun on a clear liquid diet, which he  tolerated without difficulty and was able to be transitioned to oral pain  medication.  He was able to increase his ambulation.  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  Therefore, due to his  possible colonization with resistant organisms, he was administered  perioperative vancomycin.  He began having a subjective feeling of chills  and overall feeling poorly on the evening of postoperative day #1.  He  related this possibly to the  administration of his IV antibiotics, as he had  a similar feeling in the past with vancomycin; although he thought this was  likely related to the actual infection.  The vancomycin was therefore  discontinued and he was monitored closely on the evening of postoperative  day #1.  His white blood count was within normal limits.   By postoperative  day #2, he remained afebrile and was feeling much improved  without any subjective feeling of chills.  He was therefore able to be  discharged home in excellent condition.  In addition, he maintained  excellent output from his Foley catheter, with minimal output from his  pelvic drain; and therefore his pelvic drain was removed.   DISPOSITION:  Home.   DISCHARGE MEDICATIONS:  The patient was instructed to resume his regular  home medications, except any aspirin, nonsteroidal anti-inflammatory drugs,  or herbal supplements.  He was given a prescription to take Vicodin as  needed for pain and Cipro prior to his return visit for Foley catheter  removal.   DISCHARGE INSTRUCTIONS:  The patient was instructed to be ambulatory, but  specifically instructed to refrain from any  heavy lifting or strenuous  activity.  He was told to gradually advance his diet once passing flatus.  He is instructed on routine Foley catheter care and given a leg bag for  daytime usage.   FOLLOW UP:  Brendan [name] will follow up in 1 week with Dr. Retta Diones for  removal of his Foley catheter and for staple removal.  As I am out of town  next week, I have told Brendan [name] that I do not want him to have to wait  longer to have his catheter removed.  I will then plan to see him back a few  weeks later to assess his overall progress in recovery.           ______________________________  Heloise Purpura, MD  Electronically Signed     LB/MEDQ  D:  10/25/2005  T:  10/27/2005  Job:  [id number]   cc:   Georgina Quint. Plotnikov, MD  [address]  [place]  [place] [address]    Bertram Millard. Dahlstedt, M.D.  Fax: [phone]

## 2010-06-22 NOTE — Op Note (Signed)
Brendan [name], Brendan [name] NO.:  [id number]   MEDICAL RECORD NO.:  [id number]          PATIENT TYPE:  INP   LOCATION:  0007                         FACILITY:  [facility]   PHYSICIAN:  Heloise Purpura, MD      DATE OF BIRTH:  [birth year]   DATE OF PROCEDURE:  10/23/2005  DATE OF DISCHARGE:                                 OPERATIVE REPORT   PREOPERATIVE DIAGNOSIS:  Clinically localized adenocarcinoma of prostate.   POSTOPERATIVE DIAGNOSIS:  Clinically localized adenocarcinoma of prostate.   PROCEDURE:  Robotic assisted laparoscopic radical prostatectomy (bilateral  nerve sparing).   SURGEON:  Crecencio Mc, M.D.   ASSISTANT:  Cornelious Bryant, MD.   ANESTHESIA:  General.   COMPLICATIONS:  None.   ESTIMATED BLOOD LOSS:  150 mL.   INTRAVENOUS FLUIDS:  1700 mL of lactated Ringer's.   SPECIMENS:  Prostate and seminal vesicles.   DRAINS:  1. #19 Blake pelvic drain.  2. 20-French coude catheter.   INDICATIONS FOR PROCEDURE:  Brendan [name] is a 60 year old gentleman with  clinical stage T1C prostate cancer with a PSA of 4.62 and Gleason score of 3  +3 = 6.  History pretreatment IIEF score was 24 with an IPSS score of 11.  After discussing management options for clinically localized prostate  cancer, he elected to proceed with surgical therapy and the above procedure.  Potential risks and benefits were discussed with the patient and he  consented.   DESCRIPTION OF PROCEDURE:  The patient was taken to the operating room and a  general anesthetic was administered.  He was given preoperative antibiotics,  placed in the dorsal lithotomy position, prepped and draped in the usual  sterile fashion.  Next, a preoperative time-out was performed.  A Foley  catheter was then inserted into the bladder.  A site was selected 18 cm from  the pubic symphysis and just to the left of the umbilicus for placement of  the camera port.  This was placed using a standard open Hassan technique.  This allowed entry into the peritoneal cavity under direct vision.  A 12 mm  port was then placed and a pneumoperitoneum established.  The 0 degrees lens  was then used to inspect the abdomen and there was no evidence of any intra-  abdominal injuries or other abnormalities except for some adhesions between  the sigmoid colon and the left lower quadrant abdominal wall.  The remaining  ports were placed.  We placed 8 mm robotic ports bilaterally 16 cm from the  pubic symphysis and 10 cm to either side of the camera port.  An additional  8 mm robotic port was placed in the far left lateral abdominal wall.  A 5 mm  port was placed between the camera port and the right robotic port.  An  additional 12 mm port was placed in the far right lateral abdominal wall.  All ports were placed under direct vision and without difficulty.  The  surgical cart was then docked.  With the aid of the cautery scissors, the  aforementioned adhesions between the sigmoid colon and the left lower  quadrant abdominal wall were taken down with cold sharp dissection.  This  allowed the colon to be reflected posteriorly and out of the way.  The  bladder was then reflected posteriorly with the aid of the cautery scissors  and the space of Retzius was entered.  This allowed visualization of the  prostate and endopelvic fascia.  The endopelvic fascia was then incised from  the apex back to the base of the prostate bilaterally and the underlying  levator muscle fibers were swept laterally off the prostate thereby  isolating the dorsal venous complex.  The dorsal venous complex was then  stapled and divided with the 45 mm flex ETS stapler.  The bladder neck was  then identified with the aid of Foley catheter manipulation and entered  anteriorly.  The Foley catheter balloon was visualized and the Foley  catheter balloon was deflated.  The catheter was then brought into the  operative field and used to retract the prostate  anteriorly.  This exposed  the posterior bladder neck which was then divided.  Dissection continued  posteriorly until the vasa deferentia and seminal vesicles were identified.  The vasa deferentia were isolated and divided.  Seminal vesicles were also  isolated, dissected free and lifted anteriorly after controlling the seminal  vesicle arterial blood supply.  The space between Denonvilliers' fascia and  the anterior rectum was then bluntly developed thereby isolating the  vascular pedicles of the prostate.  The lateral prostatic fascia was incised  bilaterally allowing the neurovascular bundles to be swept laterally and  posteriorly off the prostate.  The vascular pedicles of the prostate were  then ligated with Hem-o-lok clips and sharply divided above the level of the  neurovascular bundle.  The neurovascular bundles were then swept off the  prostate out to the apex and off the urethra.  The urethra was then sharply  divided allowing the prostate specimen to be disarticulated and placed up  into the abdomen for later removal.  The pelvis was then copiously irrigated  and hemostasis was ensured.  With irrigation in the pelvis, air was injected  into the rectal catheter and there was no evidence of a rectal injury.  Attention then turned to the urethral anastomosis.  A double-armed 3-0  Monocryl suture was used to perform a 360 degrees running tension-free  anastomosis between the urethra and bladder neck.  A new 20-French coude  catheter was then inserted into the bladder and irrigated.  The anastomosis  appeared to be watertight and there were no blood clots within the bladder.  A #19 Blake drain was then brought through the left robotic port and  appropriately positioned in the pelvis.  It was secured to the skin with a  nylon suture.  The surgical cart was then undocked.  The Endopouch retrieval  bag was then used to retrieve the prostate specimen via the periumbilical port site.  A 0  Vicryl suture was used to close the right lateral 12 mm port  site with the aid of a suture passer device.  All remaining ports were then  removed under direct vision.  The periumbilical port site was closed with a  running 0 Vicryl suture.  All port sites were injected with 0.25% Marcaine  and reapproximated at the skin level with staples.  The patient appeared to  tolerate the procedure well without complications.  He was able to be  extubated and transferred to the recovery unit in satisfactory condition.  ______________________________  Heloise Purpura, MD  Electronically Signed     LB/MEDQ  D:  10/23/2005  T:  10/25/2005  Job:  [id number]

## 2010-07-19 ENCOUNTER — Other Ambulatory Visit: Payer: Self-pay | Admitting: Internal Medicine

## 2010-07-19 ENCOUNTER — Other Ambulatory Visit (INDEPENDENT_AMBULATORY_CARE_PROVIDER_SITE_OTHER): Payer: Self-pay

## 2010-07-19 DIAGNOSIS — Z0389 Encounter for observation for other suspected diseases and conditions ruled out: Secondary | ICD-10-CM

## 2010-07-19 DIAGNOSIS — Z0000 Encounter for general adult medical examination without abnormal findings: Secondary | ICD-10-CM

## 2010-07-19 LAB — LIPID PANEL
Cholesterol: 164 mg/dL (ref 0–200)
HDL: 50.9 mg/dL (ref >39.00)
LDL Cholesterol: 102 mg/dL — ABNORMAL HIGH (ref 0–99)
Total CHOL/HDL Ratio: 3
Triglycerides: 55 mg/dL (ref 0.0–149.0)
VLDL: 11 mg/dL (ref 0.0–40.0)

## 2010-07-19 LAB — BASIC METABOLIC PANEL
BUN: 14 mg/dL (ref 6–23)
CO2: 31 mEq/L (ref 19–32)
Chloride: 106 mEq/L (ref 96–112)
Creatinine, Ser: 0.8 mg/dL (ref 0.4–1.5)
GFR: 101.31 mL/min (ref >60.00)
Glucose, Bld: 85 mg/dL (ref 70–99)
Potassium: 4.8 mEq/L (ref 3.5–5.1)
Sodium: 143 mEq/L (ref 135–145)

## 2010-07-19 LAB — CBC WITH DIFFERENTIAL/PLATELET
Basophils Absolute: 0 10*3/uL (ref 0.0–0.1)
Basophils Relative: 0.4 % (ref 0.0–3.0)
Eosinophils Absolute: 0.2 10*3/uL (ref 0.0–0.7)
Eosinophils Relative: 4.4 % (ref 0.0–5.0)
HCT: 43.2 % (ref 39.0–52.0)
Hemoglobin: 14.8 g/dL (ref 13.0–17.0)
Lymphocytes Relative: 28.6 % (ref 12.0–46.0)
Lymphs Abs: 1.4 10*3/uL (ref 0.7–4.0)
MCV: 88.1 fl (ref 78.0–100.0)
Monocytes Absolute: 0.3 10*3/uL (ref 0.1–1.0)
Monocytes Relative: 6.1 % (ref 3.0–12.0)
Neutro Abs: 3 10*3/uL (ref 1.4–7.7)
Neutrophils Relative %: 60.5 % (ref 43.0–77.0)
Platelets: 174 10*3/uL (ref 150.0–400.0)
RBC: 4.9 Mil/uL (ref 4.22–5.81)
WBC: 4.9 10*3/uL (ref 4.5–10.5)

## 2010-07-19 LAB — URINALYSIS
Ketones, ur: NEGATIVE
Leukocytes, UA: NEGATIVE
Nitrite: NEGATIVE
Specific Gravity, Urine: 1.015 (ref 1.000–1.030)
Urine Glucose: NEGATIVE
Urobilinogen, UA: 1 (ref 0.0–1.0)

## 2010-07-19 LAB — HEPATIC FUNCTION PANEL
ALT: 15 U/L (ref 0–53)
AST: 19 U/L (ref 0–37)
Albumin: 4.7 g/dL (ref 3.5–5.2)
Bilirubin, Direct: 0.2 mg/dL (ref 0.0–0.3)
Total Bilirubin: 0.9 mg/dL (ref 0.3–1.2)
Total Protein: 7.1 g/dL (ref 6.0–8.3)

## 2010-07-19 LAB — PSA: PSA: 0 ng/mL — ABNORMAL LOW (ref 0.10–4.00)

## 2010-07-19 LAB — TSH: TSH: 1.27 u[IU]/mL (ref 0.35–5.50)

## 2010-07-24 ENCOUNTER — Encounter: Payer: Self-pay | Admitting: Internal Medicine

## 2010-07-25 ENCOUNTER — Encounter: Payer: Self-pay | Admitting: Internal Medicine

## 2010-07-26 ENCOUNTER — Ambulatory Visit (INDEPENDENT_AMBULATORY_CARE_PROVIDER_SITE_OTHER): Payer: Federal, State, Local not specified - PPO | Admitting: Internal Medicine

## 2010-07-26 ENCOUNTER — Encounter: Payer: Self-pay | Admitting: Internal Medicine

## 2010-07-26 VITALS — BP 120/70 | HR 80 | Temp 97.7°F | Resp 16 | Ht 69.0 in | Wt 157.0 lb

## 2010-07-26 DIAGNOSIS — Z0000 Encounter for general adult medical examination without abnormal findings: Secondary | ICD-10-CM

## 2010-07-26 MED ORDER — GLYCOPYRROLATE 2 MG PO TABS: 2.0000 mg | ORAL_TABLET | Freq: Every day | ORAL | Status: DC

## 2010-07-26 MED ORDER — LANSOPRAZOLE 30 MG PO CPDR: 30.0000 mg | DELAYED_RELEASE_CAPSULE | Freq: Every day | ORAL | Status: DC

## 2010-07-26 MED ORDER — LOVASTATIN 20 MG PO TABS: 20.0000 mg | ORAL_TABLET | Freq: Every day | ORAL | Status: DC

## 2010-07-26 NOTE — Assessment & Plan Note (Signed)
We discussed age appropriate health related issues, including available/recomended screening tests and vaccinations. We discussed a need for adhering to healthy diet and exercise. Labs/EKG were reviewed/ordered. All questions were answered.   

## 2010-07-26 NOTE — Progress Notes (Signed)
  Subjective:    Patient ID: Brendan [name], male    DOB: 12-09-1950, 60 y.o.   MRN: [id number]  HPI  The patient is here for a wellness exam. The patient has been doing well overall without major physical or psychological issues going on lately  Review of Systems  Constitutional: Negative for appetite change, fatigue and unexpected weight change.  HENT: Negative for nosebleeds, congestion, sore throat, sneezing, trouble swallowing and neck pain.   Eyes: Negative for itching and visual disturbance.  Respiratory: Negative for cough.   Cardiovascular: Negative for chest pain, palpitations and leg swelling.  Gastrointestinal: Negative for nausea, diarrhea, blood in stool and abdominal distention.  Genitourinary: Negative for frequency and hematuria.  Musculoskeletal: Negative for back pain, joint swelling and gait problem.  Skin: Negative for rash.  Neurological: Negative for dizziness, tremors, speech difficulty and weakness.  Psychiatric/Behavioral: Negative for sleep disturbance, dysphoric mood and agitation. The patient is not nervous/anxious.        Objective:   Physical Exam  Constitutional: He is oriented to person, place, and time. He appears well-developed and well-nourished. No distress.  HENT:  Head: Normocephalic and atraumatic.  Right Ear: External ear normal.  Left Ear: External ear normal.  Nose: Nose normal.  Mouth/Throat: Oropharynx is clear and moist. No oropharyngeal exudate.  Eyes: Conjunctivae and EOM are normal. Pupils are equal, round, and reactive to light. Right eye exhibits no discharge. Left eye exhibits no discharge. No scleral icterus.  Neck: Normal range of motion. Neck supple. No JVD present. No tracheal deviation present. No thyromegaly present.  Cardiovascular: Normal rate, regular rhythm, normal heart sounds and intact distal pulses.  Exam reveals no gallop and no friction rub.   No murmur heard. Pulmonary/Chest: Effort normal and breath sounds  normal. No stridor. No respiratory distress. He has no wheezes. He has no rales. He exhibits no tenderness.  Abdominal: Soft. Bowel sounds are normal. He exhibits no distension and no mass. There is no tenderness. There is no rebound and no guarding.  Genitourinary: Penis normal. Guaiac negative stool. No penile tenderness.       Per Urol  Musculoskeletal: Normal range of motion. He exhibits no edema and no tenderness.  Lymphadenopathy:    He has no cervical adenopathy.  Neurological: He is alert and oriented to person, place, and time. He has normal reflexes. No cranial nerve deficit. He exhibits normal muscle tone. Coordination normal.  Skin: Skin is warm and dry. No rash noted. He is not diaphoretic. No erythema. No pallor.  Psychiatric: He has a normal mood and affect. His behavior is normal. Judgment and thought content normal.        Lab Results  Component Value Date   WBC 4.9 07/19/2010   HGB 14.8 07/19/2010   HCT 43.2 07/19/2010   PLT 174.0 07/19/2010   CHOL 164 07/19/2010   TRIG 55.0 07/19/2010   HDL 50.90 07/19/2010   ALT 15 07/19/2010   AST 19 07/19/2010   NA 143 07/19/2010   K 4.8 07/19/2010   CL 106 07/19/2010   CREATININE 0.8 07/19/2010   BUN 14 07/19/2010   CO2 31 07/19/2010   TSH 1.27 07/19/2010   PSA 0.00 Repeated and verified X2.* 07/19/2010     Assessment & Plan:

## 2010-11-20 ENCOUNTER — Ambulatory Visit (INDEPENDENT_AMBULATORY_CARE_PROVIDER_SITE_OTHER): Payer: Federal, State, Local not specified - PPO | Admitting: *Deleted

## 2010-11-20 DIAGNOSIS — Z23 Encounter for immunization: Secondary | ICD-10-CM

## 2011-02-07 ENCOUNTER — Telehealth: Payer: Self-pay | Admitting: *Deleted

## 2011-02-07 DIAGNOSIS — Z0000 Encounter for general adult medical examination without abnormal findings: Secondary | ICD-10-CM

## 2011-02-07 DIAGNOSIS — Z0389 Encounter for observation for other suspected diseases and conditions ruled out: Secondary | ICD-10-CM

## 2011-02-07 NOTE — Telephone Encounter (Signed)
Labs entered for 07/2011 CPE.

## 2011-02-07 NOTE — Telephone Encounter (Signed)
Message copied by Merrilyn Puma on Thu Feb 07, 2011  1:29 PM ------      Message from: COUSIN, SHARON T      Created: Thu Feb 07, 2011  9:29 AM      Regarding: PHYSICALS DATE       Lynford Humphrey

## 2011-07-16 ENCOUNTER — Encounter: Payer: Self-pay | Admitting: Internal Medicine

## 2011-07-16 ENCOUNTER — Other Ambulatory Visit: Payer: Self-pay | Admitting: Internal Medicine

## 2011-07-16 ENCOUNTER — Ambulatory Visit (INDEPENDENT_AMBULATORY_CARE_PROVIDER_SITE_OTHER): Payer: Federal, State, Local not specified - PPO | Admitting: Internal Medicine

## 2011-07-16 VITALS — BP 104/68 | HR 69 | Temp 97.0°F | Ht 69.0 in | Wt 161.2 lb

## 2011-07-16 DIAGNOSIS — H1030 Unspecified acute conjunctivitis, unspecified eye: Secondary | ICD-10-CM

## 2011-07-16 DIAGNOSIS — J309 Allergic rhinitis, unspecified: Secondary | ICD-10-CM

## 2011-07-16 DIAGNOSIS — H1032 Unspecified acute conjunctivitis, left eye: Secondary | ICD-10-CM | POA: Insufficient documentation

## 2011-07-16 MED ORDER — TOBRAMYCIN 0.3 % OP SOLN: 1.0000 [drp] | Freq: Four times a day (QID) | OPHTHALMIC | Status: AC

## 2011-07-16 NOTE — Assessment & Plan Note (Signed)
stable overall by hx and exam, and pt to continue medical treatment as before 

## 2011-07-16 NOTE — Patient Instructions (Signed)
Take all new medications as prescribed Continue all other medications as before Please return in 1 month for your yearly visit, or sooner if needed, with Dr Posey Rea

## 2011-07-16 NOTE — Assessment & Plan Note (Signed)
Mild to mod, for antibx course,  to f/u any worsening symptoms or concerns 

## 2011-07-16 NOTE — Progress Notes (Signed)
Subjective:    Patient ID: Brendan [name], male    DOB: 07-01-1950, 61 y.o.   MRN: [id number]  HPI  Here with acute visit, was to see Dr Posey Rea but yearly appt re-scheduled.  Has ongoing mild seaonal allergies this time of yr; Does have several wks ongoing nasal allergy symptoms with clear congestion, itch and sneeze, without fever, pain, ST, cough or wheezing., but better with OTC antihistamine.  Now with 3-4 days acute onset left eye red, swelling, tearing/d/c and am matting, with feeling warm.  Has 6 grandkids, one of them always ill for some reason it seems.  Pt denies chest pain, increased sob or doe, wheezing, orthopnea, PND, increased LE swelling, palpitations, dizziness or syncope.   Pt denies polydipsia, polyuria.  Pt denies new neurological symptoms such as new headache, or facial or extremity weakness or numbness Past Medical History  Diagnosis Date  . Allergy     rhinitis  . Cancer     prostate.  skin basal cell  . GERD (gastroesophageal reflux disease)   . Hyperlipidemia   . IBS (irritable bowel syndrome)   . ED (erectile dysfunction)    Past Surgical History  Procedure Date  . Prostatectomy 2007  . Achilles tendon surgery     prosthesis    reports that he has never smoked. He does not have any smokeless tobacco history on file. He reports that he does not drink alcohol. His drug history not on file. family history includes Heart disease in his mother; Heart disease (age of onset:70) in his father; and Hyperlipidemia in an unspecified family member. Allergies  Allergen Reactions  . Hydrocodone-Acetaminophen    Current Outpatient Prescriptions on File Prior to Visit  Medication Sig Dispense Refill  . Cholecalciferol 1000 UNITS tablet Take 1,000 Units by mouth daily.        Marland Kitchen glycopyrrolate (ROBINUL) 2 MG tablet Take 1 tablet (2 mg total) by mouth daily.  90 tablet  3  . lovastatin (MEVACOR) 20 MG tablet Take 1 tablet (20 mg total) by mouth daily.  90 tablet  3  . NON  FORMULARY 2 mg as directed.        Marland Kitchen DISCONTD: lansoprazole (PREVACID) 30 MG capsule Take 1 capsule (30 mg total) by mouth daily.  90 capsule  3   Review of Systems Review of Systems  Constitutional: Negative for diaphoresis and unexpected weight change.  HENT: Negative for drooling and tinnitus.   Eyes: Negative for photophobia and visual disturbance.  Respiratory: Negative for choking and stridor.   Gastrointestinal: Negative for vomiting and blood in stool.  Genitourinary: Negative for hematuria and decreased urine volume.  Musculoskeletal: Negative for gait problem.  Skin: Negative for color change and wound.     Objective:   Physical Exam BP 104/68  Pulse 69  Temp(Src) 97 F (36.1 C) (Oral)  Ht 5\' 9"  (1.753 m)  Wt 161 lb 4 oz (73.143 kg)  BMI 23.81 kg/m2  SpO2 96% Physical Exam  VS noted Constitutional: Pt appears well-developed and well-nourished.  HENT: Head: Normocephalic.  Right Ear: External ear normal.  Left Ear: External ear normal.   Left conjucntiva mod erythema, and weepy d/c,  Right conjucntiva clear,  Sinus nontender.  Pharynx mild erythema Eyes: Conjunctivae and EOM are normal. Pupils are equal, round, and reactive to light.  Neck: Normal range of motion. Neck supple.  Cardiovascular: Normal rate and regular rhythm.   Pulmonary/Chest: Effort normal and breath sounds normal.  Neurological: Pt is alert.  No cranial nerve defecit.  Motor/gait intact Skin: Skin is warm. No erythema.  Psychiatric: Pt behavior is normal. Thought content normal. not depressed appearance    Assessment & Plan:

## 2011-07-19 ENCOUNTER — Telehealth: Payer: Self-pay | Admitting: *Deleted

## 2011-07-19 NOTE — Telephone Encounter (Signed)
A user error has taken place:.Marland KitchenMarland Kitchen6/14/13@1 :23pm/LMB

## 2011-07-30 ENCOUNTER — Encounter: Payer: Federal, State, Local not specified - PPO | Admitting: Internal Medicine

## 2011-08-19 ENCOUNTER — Encounter: Payer: Self-pay | Admitting: Internal Medicine

## 2011-08-23 ENCOUNTER — Other Ambulatory Visit (INDEPENDENT_AMBULATORY_CARE_PROVIDER_SITE_OTHER): Payer: Federal, State, Local not specified - PPO

## 2011-08-23 DIAGNOSIS — Z0000 Encounter for general adult medical examination without abnormal findings: Secondary | ICD-10-CM

## 2011-08-23 DIAGNOSIS — Z0389 Encounter for observation for other suspected diseases and conditions ruled out: Secondary | ICD-10-CM

## 2011-08-23 LAB — URINALYSIS, ROUTINE W REFLEX MICROSCOPIC
Bilirubin Urine: NEGATIVE
Ketones, ur: NEGATIVE
Leukocytes, UA: NEGATIVE
Nitrite: NEGATIVE
Specific Gravity, Urine: <=1.005 (ref 1.000–1.030)
Urobilinogen, UA: 0.2 (ref 0.0–1.0)
pH: 7 (ref 5.0–8.0)

## 2011-08-23 LAB — BASIC METABOLIC PANEL
BUN: 14 mg/dL (ref 6–23)
CO2: 32 mEq/L (ref 19–32)
Calcium: 9.5 mg/dL (ref 8.4–10.5)
GFR: 99.55 mL/min (ref >60.00)
Glucose, Bld: 96 mg/dL (ref 70–99)

## 2011-08-23 LAB — HEPATIC FUNCTION PANEL
Albumin: 4.4 g/dL (ref 3.5–5.2)
Alkaline Phosphatase: 47 U/L (ref 39–117)
Total Protein: 6.7 g/dL (ref 6.0–8.3)

## 2011-08-23 LAB — PSA: PSA: 0.01 ng/mL — ABNORMAL LOW (ref 0.10–4.00)

## 2011-09-02 ENCOUNTER — Encounter: Payer: Self-pay | Admitting: Internal Medicine

## 2011-09-02 ENCOUNTER — Other Ambulatory Visit: Payer: Self-pay | Admitting: *Deleted

## 2011-09-02 ENCOUNTER — Ambulatory Visit (INDEPENDENT_AMBULATORY_CARE_PROVIDER_SITE_OTHER): Payer: Federal, State, Local not specified - PPO | Admitting: Internal Medicine

## 2011-09-02 ENCOUNTER — Other Ambulatory Visit (INDEPENDENT_AMBULATORY_CARE_PROVIDER_SITE_OTHER): Payer: Federal, State, Local not specified - PPO

## 2011-09-02 VITALS — BP 130/70 | HR 63 | Temp 97.8°F | Resp 16 | Wt 160.2 lb

## 2011-09-02 DIAGNOSIS — Z0000 Encounter for general adult medical examination without abnormal findings: Secondary | ICD-10-CM

## 2011-09-02 DIAGNOSIS — Z8546 Personal history of malignant neoplasm of prostate: Secondary | ICD-10-CM

## 2011-09-02 DIAGNOSIS — Z2911 Encounter for prophylactic immunotherapy for respiratory syncytial virus (RSV): Secondary | ICD-10-CM

## 2011-09-02 DIAGNOSIS — Z136 Encounter for screening for cardiovascular disorders: Secondary | ICD-10-CM

## 2011-09-02 DIAGNOSIS — Z23 Encounter for immunization: Secondary | ICD-10-CM

## 2011-09-02 DIAGNOSIS — K219 Gastro-esophageal reflux disease without esophagitis: Secondary | ICD-10-CM

## 2011-09-02 LAB — CBC WITH DIFFERENTIAL/PLATELET
Basophils Absolute: 0 10*3/uL (ref 0.0–0.1)
Basophils Relative: 0.4 % (ref 0.0–3.0)
Eosinophils Relative: 3 % (ref 0.0–5.0)
Hemoglobin: 14.4 g/dL (ref 13.0–17.0)
Lymphocytes Relative: 22.9 % (ref 12.0–46.0)
Monocytes Relative: 7.4 % (ref 3.0–12.0)
Neutro Abs: 3.8 10*3/uL (ref 1.4–7.7)
RBC: 4.85 Mil/uL (ref 4.22–5.81)
WBC: 5.8 10*3/uL (ref 4.5–10.5)

## 2011-09-02 LAB — LIPID PANEL
HDL: 56.2 mg/dL (ref >39.00)
LDL Cholesterol: 92 mg/dL (ref 0–99)
Total CHOL/HDL Ratio: 3
VLDL: 13 mg/dL (ref 0.0–40.0)

## 2011-09-02 LAB — TSH: TSH: 1.08 u[IU]/mL (ref 0.35–5.50)

## 2011-09-02 MED ORDER — LOVASTATIN 20 MG PO TABS: 20.0000 mg | ORAL_TABLET | Freq: Every day | ORAL | Status: DC

## 2011-09-02 MED ORDER — LANSOPRAZOLE 30 MG PO CPDR: DELAYED_RELEASE_CAPSULE | ORAL | Status: DC

## 2011-09-02 MED ORDER — GLYCOPYRROLATE 2 MG PO TABS: 2.0000 mg | ORAL_TABLET | Freq: Every day | ORAL | Status: DC

## 2011-09-02 NOTE — Assessment & Plan Note (Signed)
We discussed age appropriate health related issues, including available/recomended screening tests and vaccinations. We discussed a need for adhering to healthy diet and exercise. Labs/EKG were reviewed/ordered. All questions were answered.   

## 2011-09-02 NOTE — Progress Notes (Signed)
Subjective:    Patient ID: Brendan [name], male    DOB: [birth year], 61 y.o.   MRN: [id number]  HPI  The patient is here for a wellness exam. The patient has been doing well overall without major physical or psychological issues going on lately...  BP Readings from Last 3 Encounters:  09/02/11 130/70  07/16/11 104/68  07/26/10 120/70   Wt Readings from Last 3 Encounters:  09/02/11 160 lb 4 oz (72.689 kg)  07/16/11 161 lb 4 oz (73.143 kg)  07/26/10 157 lb (71.215 kg)      Review of Systems  Constitutional: Negative for appetite change, fatigue and unexpected weight change.  HENT: Negative for nosebleeds, congestion, sore throat, sneezing, trouble swallowing and neck pain.   Eyes: Negative for itching and visual disturbance.  Respiratory: Negative for cough.   Cardiovascular: Negative for chest pain, palpitations and leg swelling.  Gastrointestinal: Negative for nausea, diarrhea, blood in stool and abdominal distention.  Genitourinary: Negative for frequency and hematuria.  Musculoskeletal: Negative for back pain, joint swelling and gait problem.  Skin: Negative for rash.  Neurological: Negative for dizziness, tremors, speech difficulty and weakness.  Psychiatric/Behavioral: Negative for disturbed wake/sleep cycle, dysphoric mood and agitation. The patient is not nervous/anxious.        Objective:   Physical Exam  Constitutional: He is oriented to person, place, and time. He appears well-developed and well-nourished. No distress.  HENT:  Head: Normocephalic and atraumatic.  Right Ear: External ear normal.  Left Ear: External ear normal.  Nose: Nose normal.  Mouth/Throat: Oropharynx is clear and moist. No oropharyngeal exudate.  Eyes: Conjunctivae and EOM are normal. Pupils are equal, round, and reactive to light. Right eye exhibits no discharge. Left eye exhibits no discharge. No scleral icterus.  Neck: Normal range of motion. Neck supple. No JVD present. No tracheal  deviation present. No thyromegaly present.  Cardiovascular: Normal rate, regular rhythm, normal heart sounds and intact distal pulses.  Exam reveals no gallop and no friction rub.   No murmur heard. Pulmonary/Chest: Effort normal and breath sounds normal. No stridor. No respiratory distress. He has no wheezes. He has no rales. He exhibits no tenderness.  Abdominal: Soft. Bowel sounds are normal. He exhibits no distension and no mass. There is no tenderness. There is no rebound and no guarding.  Genitourinary: Rectum normal and penis normal. Guaiac negative stool. No penile tenderness.       Absent prostate  Musculoskeletal: Normal range of motion. He exhibits no edema and no tenderness.  Lymphadenopathy:    He has no cervical adenopathy.  Neurological: He is alert and oriented to person, place, and time. He has normal reflexes. No cranial nerve deficit. He exhibits normal muscle tone. Coordination normal.  Skin: Skin is warm and dry. No rash noted. He is not diaphoretic. No erythema. No pallor.  Psychiatric: He has a normal mood and affect. His behavior is normal. Judgment and thought content normal.        Lab Results  Component Value Date   WBC 4.9 07/19/2010   HGB 14.8 07/19/2010   HCT 43.2 07/19/2010   PLT 174.0 07/19/2010   CHOL 164 07/19/2010   TRIG 55.0 07/19/2010   HDL 50.90 07/19/2010   ALT 15 08/23/2011   AST 19 08/23/2011   NA 142 08/23/2011   K 4.5 08/23/2011   CL 103 08/23/2011   CREATININE 0.8 08/23/2011   BUN 14 08/23/2011   CO2 32 08/23/2011   TSH 1.27 07/19/2010  PSA 0.01* 08/23/2011     Assessment & Plan:

## 2011-09-02 NOTE — Assessment & Plan Note (Signed)
Dr Laverle Patter 2007

## 2011-09-02 NOTE — Assessment & Plan Note (Signed)
Continue with current prescription therapy as reflected on the Med list.  

## 2011-09-03 ENCOUNTER — Telehealth: Payer: Self-pay | Admitting: Internal Medicine

## 2011-09-03 NOTE — Telephone Encounter (Signed)
Left detailed mess informing pt of below. Copies mailed.  

## 2011-09-03 NOTE — Telephone Encounter (Signed)
Stacey, please, inform patient that all labs are normal   Please, mail the labs to the patient.    Thx  

## 2011-12-03 ENCOUNTER — Ambulatory Visit (INDEPENDENT_AMBULATORY_CARE_PROVIDER_SITE_OTHER): Payer: Federal, State, Local not specified - PPO

## 2011-12-03 DIAGNOSIS — Z23 Encounter for immunization: Secondary | ICD-10-CM

## 2012-05-15 ENCOUNTER — Telehealth: Payer: Self-pay | Admitting: *Deleted

## 2012-05-15 DIAGNOSIS — Z0000 Encounter for general adult medical examination without abnormal findings: Secondary | ICD-10-CM

## 2012-05-15 DIAGNOSIS — Z125 Encounter for screening for malignant neoplasm of prostate: Secondary | ICD-10-CM

## 2012-05-15 NOTE — Telephone Encounter (Signed)
Message copied by Deatra James on Fri May 15, 2012  2:10 PM ------      Message from: Etheleen Sia      Created: Fri May 15, 2012  8:40 AM      Regarding: LAB       PHYSICAL LABS IN [place] ------

## 2012-05-15 NOTE — Telephone Encounter (Signed)
Received staff msg pt made cpx for June, entering cpx labs...Brendan [name]

## 2012-07-16 ENCOUNTER — Other Ambulatory Visit (INDEPENDENT_AMBULATORY_CARE_PROVIDER_SITE_OTHER): Payer: Federal, State, Local not specified - PPO

## 2012-07-16 DIAGNOSIS — Z0000 Encounter for general adult medical examination without abnormal findings: Secondary | ICD-10-CM

## 2012-07-16 DIAGNOSIS — Z125 Encounter for screening for malignant neoplasm of prostate: Secondary | ICD-10-CM

## 2012-07-16 LAB — CBC WITH DIFFERENTIAL/PLATELET
Basophils Relative: 0.6 % (ref 0.0–3.0)
Eosinophils Absolute: 0.3 10*3/uL (ref 0.0–0.7)
HCT: 45.1 % (ref 39.0–52.0)
Hemoglobin: 15.1 g/dL (ref 13.0–17.0)
Lymphocytes Relative: 29.3 % (ref 12.0–46.0)
Lymphs Abs: 1.5 10*3/uL (ref 0.7–4.0)
MCHC: 33.4 g/dL (ref 30.0–36.0)
Neutro Abs: 2.9 10*3/uL (ref 1.4–7.7)
RBC: 5.06 Mil/uL (ref 4.22–5.81)

## 2012-07-16 LAB — URINALYSIS, ROUTINE W REFLEX MICROSCOPIC
Nitrite: NEGATIVE
RBC / HPF: NONE SEEN (ref 0–?)
Specific Gravity, Urine: 1.01 (ref 1.000–1.030)
Total Protein, Urine: NEGATIVE
Urine Glucose: NEGATIVE
WBC, UA: NONE SEEN (ref 0–?)
pH: 6.5 (ref 5.0–8.0)

## 2012-07-16 LAB — LIPID PANEL
HDL: 47.7 mg/dL (ref >39.00)
Total CHOL/HDL Ratio: 3

## 2012-07-16 LAB — BASIC METABOLIC PANEL
CO2: 32 mEq/L (ref 19–32)
Calcium: 9.3 mg/dL (ref 8.4–10.5)
Chloride: 104 mEq/L (ref 96–112)
Potassium: 4 mEq/L (ref 3.5–5.1)
Sodium: 141 mEq/L (ref 135–145)

## 2012-07-16 LAB — HEPATIC FUNCTION PANEL
AST: 18 U/L (ref 0–37)
Albumin: 4.2 g/dL (ref 3.5–5.2)
Alkaline Phosphatase: 46 U/L (ref 39–117)
Total Protein: 6.4 g/dL (ref 6.0–8.3)

## 2012-07-16 LAB — PSA: PSA: 0 ng/mL — ABNORMAL LOW (ref 0.10–4.00)

## 2012-07-24 ENCOUNTER — Encounter: Payer: Self-pay | Admitting: Internal Medicine

## 2012-07-24 ENCOUNTER — Ambulatory Visit (INDEPENDENT_AMBULATORY_CARE_PROVIDER_SITE_OTHER): Payer: Federal, State, Local not specified - PPO | Admitting: Internal Medicine

## 2012-07-24 VITALS — BP 132/80 | HR 72 | Temp 97.7°F | Resp 16 | Ht 69.0 in | Wt 158.0 lb

## 2012-07-24 DIAGNOSIS — H9113 Presbycusis, bilateral: Secondary | ICD-10-CM

## 2012-07-24 DIAGNOSIS — Z8546 Personal history of malignant neoplasm of prostate: Secondary | ICD-10-CM

## 2012-07-24 DIAGNOSIS — Z0000 Encounter for general adult medical examination without abnormal findings: Secondary | ICD-10-CM

## 2012-07-24 DIAGNOSIS — E785 Hyperlipidemia, unspecified: Secondary | ICD-10-CM

## 2012-07-24 DIAGNOSIS — Z136 Encounter for screening for cardiovascular disorders: Secondary | ICD-10-CM

## 2012-07-24 DIAGNOSIS — H9190 Unspecified hearing loss, unspecified ear: Secondary | ICD-10-CM

## 2012-07-24 DIAGNOSIS — H9110 Presbycusis, unspecified ear: Secondary | ICD-10-CM

## 2012-07-24 MED ORDER — LOVASTATIN 20 MG PO TABS: 20.0000 mg | ORAL_TABLET | Freq: Every day | ORAL | Status: DC

## 2012-07-24 MED ORDER — LANSOPRAZOLE 30 MG PO CPDR: DELAYED_RELEASE_CAPSULE | ORAL | Status: DC

## 2012-07-24 MED ORDER — GLYCOPYRROLATE 2 MG PO TABS: 2.0000 mg | ORAL_TABLET | Freq: Every day | ORAL | Status: DC

## 2012-07-24 NOTE — Progress Notes (Signed)
  Subjective:    HPI  The patient is here for a wellness exam. The patient has been doing well overall without major physical or psychological issues going on lately, except for hearing loss - ?  Review of Systems  Constitutional: Negative for appetite change, fatigue and unexpected weight change.  HENT: Negative for nosebleeds, congestion, sore throat, sneezing, trouble swallowing and neck pain.   Eyes: Negative for itching and visual disturbance.  Respiratory: Negative for cough.   Cardiovascular: Negative for chest pain, palpitations and leg swelling.  Gastrointestinal: Negative for nausea, diarrhea, blood in stool and abdominal distention.  Genitourinary: Negative for frequency and hematuria.  Musculoskeletal: Negative for back pain, joint swelling and gait problem.  Skin: Negative for rash.  Neurological: Negative for dizziness, tremors, speech difficulty and weakness.  Psychiatric/Behavioral: Negative for sleep disturbance, dysphoric mood and agitation. The patient is not nervous/anxious.        Objective:   Physical Exam  Constitutional: He is oriented to person, place, and time. He appears well-developed and well-nourished. No distress.  HENT:  Head: Normocephalic and atraumatic.  Right Ear: External ear normal.  Left Ear: External ear normal.  Nose: Nose normal.  Mouth/Throat: Oropharynx is clear and moist. No oropharyngeal exudate.  Eyes: Conjunctivae and EOM are normal. Pupils are equal, round, and reactive to light. Right eye exhibits no discharge. Left eye exhibits no discharge. No scleral icterus.  Neck: Normal range of motion. Neck supple. No JVD present. No tracheal deviation present. No thyromegaly present.  Cardiovascular: Normal rate, regular rhythm, normal heart sounds and intact distal pulses.  Exam reveals no gallop and no friction rub.   No murmur heard. Pulmonary/Chest: Effort normal and breath sounds normal. No stridor. No respiratory distress. He has no  wheezes. He has no rales. He exhibits no tenderness.  Abdominal: Soft. Bowel sounds are normal. He exhibits no distension and no mass. There is no tenderness. There is no rebound and no guarding.  Genitourinary: Penis normal. Guaiac negative stool. No penile tenderness.  Per Urol  Musculoskeletal: Normal range of motion. He exhibits no edema and no tenderness.  Lymphadenopathy:    He has no cervical adenopathy.  Neurological: He is alert and oriented to person, place, and time. He has normal reflexes. No cranial nerve deficit. He exhibits normal muscle tone. Coordination normal.  Skin: Skin is warm and dry. No rash noted. He is not diaphoretic. No erythema. No pallor.  Psychiatric: He has a normal mood and affect. His behavior is normal. Judgment and thought content normal.        Lab Results  Component Value Date   WBC 5.1 07/16/2012   HGB 15.1 07/16/2012   HCT 45.1 07/16/2012   PLT 182.0 07/16/2012   CHOL 159 07/16/2012   TRIG 89.0 07/16/2012   HDL 47.70 07/16/2012   ALT 16 07/16/2012   AST 18 07/16/2012   NA 141 07/16/2012   K 4.0 07/16/2012   CL 104 07/16/2012   CREATININE 0.8 07/16/2012   BUN 12 07/16/2012   CO2 32 07/16/2012   TSH 1.29 07/16/2012   PSA 0.00* 07/16/2012     Assessment & Plan:

## 2012-07-31 ENCOUNTER — Encounter: Payer: Federal, State, Local not specified - PPO | Admitting: Internal Medicine

## 2012-08-03 DIAGNOSIS — H9110 Presbycusis, unspecified ear: Secondary | ICD-10-CM | POA: Insufficient documentation

## 2012-08-03 NOTE — Assessment & Plan Note (Signed)
Audiol ref 

## 2012-08-03 NOTE — Assessment & Plan Note (Signed)
F/u w/urol

## 2012-08-03 NOTE — Assessment & Plan Note (Signed)
Continue with current prescription therapy as reflected on the Med list.  

## 2012-08-03 NOTE — Assessment & Plan Note (Signed)
We discussed age appropriate health related issues, including available/recomended screening tests and vaccinations. We discussed a need for adhering to healthy diet and exercise. Labs/EKG were reviewed/ordered. All questions were answered.   

## 2012-08-17 ENCOUNTER — Other Ambulatory Visit: Payer: Self-pay | Admitting: *Deleted

## 2012-08-17 DIAGNOSIS — Z136 Encounter for screening for cardiovascular disorders: Secondary | ICD-10-CM

## 2012-08-17 MED ORDER — GLYCOPYRROLATE 2 MG PO TABS: 2.0000 mg | ORAL_TABLET | Freq: Every day | ORAL | Status: DC

## 2012-08-17 MED ORDER — LOVASTATIN 20 MG PO TABS: 20.0000 mg | ORAL_TABLET | Freq: Every day | ORAL | Status: DC

## 2012-08-17 MED ORDER — LANSOPRAZOLE 30 MG PO CPDR: DELAYED_RELEASE_CAPSULE | ORAL | Status: DC

## 2012-09-04 ENCOUNTER — Encounter: Payer: Self-pay | Admitting: Internal Medicine

## 2012-11-04 ENCOUNTER — Ambulatory Visit (INDEPENDENT_AMBULATORY_CARE_PROVIDER_SITE_OTHER): Payer: Federal, State, Local not specified - PPO

## 2012-11-04 DIAGNOSIS — Z23 Encounter for immunization: Secondary | ICD-10-CM

## 2013-01-18 ENCOUNTER — Encounter: Payer: Self-pay | Admitting: Internal Medicine

## 2013-01-18 ENCOUNTER — Ambulatory Visit (INDEPENDENT_AMBULATORY_CARE_PROVIDER_SITE_OTHER): Payer: Federal, State, Local not specified - PPO | Admitting: Internal Medicine

## 2013-01-18 VITALS — BP 114/66 | HR 72 | Temp 96.7°F | Resp 16 | Wt 166.0 lb

## 2013-01-18 DIAGNOSIS — L03317 Cellulitis of buttock: Secondary | ICD-10-CM

## 2013-01-18 DIAGNOSIS — L0231 Cutaneous abscess of buttock: Secondary | ICD-10-CM

## 2013-01-18 MED ORDER — DOXYCYCLINE HYCLATE 100 MG PO TABS: 100.0000 mg | ORAL_TABLET | Freq: Two times a day (BID) | ORAL | Status: DC

## 2013-01-18 NOTE — Progress Notes (Signed)
   Subjective:    HPI C/o L buttock pain x 24 hrs  Review of Systems  Constitutional: Negative for appetite change, fatigue and unexpected weight change.  HENT: Negative for congestion, nosebleeds, sneezing, sore throat and trouble swallowing.   Eyes: Negative for itching and visual disturbance.  Respiratory: Negative for cough.   Cardiovascular: Negative for chest pain, palpitations and leg swelling.  Gastrointestinal: Negative for nausea, diarrhea, blood in stool and abdominal distention.  Genitourinary: Negative for frequency and hematuria.  Musculoskeletal: Negative for back pain, gait problem, joint swelling and neck pain.  Skin: Negative for rash.  Neurological: Negative for dizziness, tremors, speech difficulty and weakness.  Psychiatric/Behavioral: Negative for sleep disturbance, dysphoric mood and agitation. The patient is not nervous/anxious.        Objective:   Physical Exam  Constitutional: He is oriented to person, place, and time. He appears well-developed and well-nourished. No distress.  HENT:  Head: Normocephalic and atraumatic.  Right Ear: External ear normal.  Left Ear: External ear normal.  Nose: Nose normal.  Mouth/Throat: Oropharynx is clear and moist. No oropharyngeal exudate.  Eyes: Conjunctivae and EOM are normal. Pupils are equal, round, and reactive to light. Right eye exhibits no discharge. Left eye exhibits no discharge. No scleral icterus.  Neck: Normal range of motion. Neck supple. No JVD present. No tracheal deviation present. No thyromegaly present.  Cardiovascular: Normal rate, regular rhythm, normal heart sounds and intact distal pulses.  Exam reveals no gallop and no friction rub.   No murmur heard. Pulmonary/Chest: Effort normal and breath sounds normal. No stridor. No respiratory distress. He has no wheezes. He has no rales. He exhibits no tenderness.  Abdominal: Soft. Bowel sounds are normal. He exhibits no distension and no mass. There is no  tenderness. There is no rebound and no guarding.  Genitourinary: Penis normal. Guaiac negative stool. No penile tenderness.  Per Urol  Musculoskeletal: Normal range of motion. He exhibits no edema and no tenderness.  Lymphadenopathy:    He has no cervical adenopathy.  Neurological: He is alert and oriented to person, place, and time. He has normal reflexes. No cranial nerve deficit. He exhibits normal muscle tone. Coordination normal.  Skin: Skin is warm and dry. No rash noted. He is not diaphoretic. No erythema. No pallor.  Psychiatric: He has a normal mood and affect. His behavior is normal. Judgment and thought content normal.  L medial buttock 1 cm infiltrate proximally      Lab Results  Component Value Date   WBC 5.1 07/16/2012   HGB 15.1 07/16/2012   HCT 45.1 07/16/2012   PLT 182.0 07/16/2012   CHOL 159 07/16/2012   TRIG 89.0 07/16/2012   HDL 47.70 07/16/2012   ALT 16 07/16/2012   AST 18 07/16/2012   NA 141 07/16/2012   K 4.0 07/16/2012   CL 104 07/16/2012   CREATININE 0.8 07/16/2012   BUN 12 07/16/2012   CO2 32 07/16/2012   TSH 1.29 07/16/2012   PSA 0.00* 07/16/2012     Assessment & Plan:

## 2013-01-18 NOTE — Assessment & Plan Note (Signed)
12/14 Doxy 100 mg bid x 10 d

## 2013-01-18 NOTE — Progress Notes (Signed)
Pre visit review using our clinic review tool, if applicable. No additional management support is needed unless otherwise documented below in the visit note. 

## 2013-07-26 ENCOUNTER — Other Ambulatory Visit (INDEPENDENT_AMBULATORY_CARE_PROVIDER_SITE_OTHER): Payer: Federal, State, Local not specified - PPO

## 2013-07-26 ENCOUNTER — Ambulatory Visit (INDEPENDENT_AMBULATORY_CARE_PROVIDER_SITE_OTHER): Payer: Federal, State, Local not specified - PPO | Admitting: Internal Medicine

## 2013-07-26 ENCOUNTER — Encounter: Payer: Self-pay | Admitting: Internal Medicine

## 2013-07-26 VITALS — BP 112/82 | HR 76 | Temp 98.0°F | Resp 16 | Wt 160.0 lb

## 2013-07-26 DIAGNOSIS — Z0000 Encounter for general adult medical examination without abnormal findings: Secondary | ICD-10-CM

## 2013-07-26 DIAGNOSIS — Z136 Encounter for screening for cardiovascular disorders: Secondary | ICD-10-CM

## 2013-07-26 LAB — CBC WITH DIFFERENTIAL/PLATELET
BASOS PCT: 0.5 % (ref 0.0–3.0)
Basophils Absolute: 0 10*3/uL (ref 0.0–0.1)
EOS ABS: 0.3 10*3/uL (ref 0.0–0.7)
Eosinophils Relative: 5.5 % — ABNORMAL HIGH (ref 0.0–5.0)
HCT: 45.4 % (ref 39.0–52.0)
Hemoglobin: 15.1 g/dL (ref 13.0–17.0)
LYMPHS PCT: 28.8 % (ref 12.0–46.0)
Lymphs Abs: 1.6 10*3/uL (ref 0.7–4.0)
MCHC: 33.4 g/dL (ref 30.0–36.0)
MCV: 88.4 fl (ref 78.0–100.0)
Monocytes Absolute: 0.4 10*3/uL (ref 0.1–1.0)
Monocytes Relative: 6.6 % (ref 3.0–12.0)
NEUTROS PCT: 58.6 % (ref 43.0–77.0)
Neutro Abs: 3.3 10*3/uL (ref 1.4–7.7)
Platelets: 183 10*3/uL (ref 150.0–400.0)
RBC: 5.14 Mil/uL (ref 4.22–5.81)
RDW: 13.4 % (ref 11.5–15.5)
WBC: 5.6 10*3/uL (ref 4.0–10.5)

## 2013-07-26 LAB — HEPATIC FUNCTION PANEL
ALT: 16 U/L (ref 0–53)
AST: 17 U/L (ref 0–37)
Albumin: 4.6 g/dL (ref 3.5–5.2)
Alkaline Phosphatase: 48 U/L (ref 39–117)
BILIRUBIN DIRECT: 0.2 mg/dL (ref 0.0–0.3)
TOTAL PROTEIN: 6.9 g/dL (ref 6.0–8.3)
Total Bilirubin: 1.3 mg/dL — ABNORMAL HIGH (ref 0.2–1.2)

## 2013-07-26 LAB — URINALYSIS
Bilirubin Urine: NEGATIVE
KETONES UR: NEGATIVE
Leukocytes, UA: NEGATIVE
NITRITE: NEGATIVE
PH: 6 (ref 5.0–8.0)
SPECIFIC GRAVITY, URINE: 1.01 (ref 1.000–1.030)
TOTAL PROTEIN, URINE-UPE24: NEGATIVE
Urine Glucose: NEGATIVE
Urobilinogen, UA: 0.2 (ref 0.0–1.0)

## 2013-07-26 LAB — BASIC METABOLIC PANEL
BUN: 11 mg/dL (ref 6–23)
CHLORIDE: 105 meq/L (ref 96–112)
CO2: 31 mEq/L (ref 19–32)
Calcium: 9.6 mg/dL (ref 8.4–10.5)
Creatinine, Ser: 0.8 mg/dL (ref 0.4–1.5)
GFR: 103.23 mL/min (ref >60.00)
GLUCOSE: 84 mg/dL (ref 70–99)
POTASSIUM: 4.7 meq/L (ref 3.5–5.1)
Sodium: 142 mEq/L (ref 135–145)

## 2013-07-26 LAB — LIPID PANEL
CHOLESTEROL: 161 mg/dL (ref 0–200)
HDL: 52.6 mg/dL (ref >39.00)
LDL CALC: 87 mg/dL (ref 0–99)
NonHDL: 108.4
TRIGLYCERIDES: 107 mg/dL (ref 0.0–149.0)
Total CHOL/HDL Ratio: 3
VLDL: 21.4 mg/dL (ref 0.0–40.0)

## 2013-07-26 LAB — TSH: TSH: 1.31 u[IU]/mL (ref 0.35–4.50)

## 2013-07-26 LAB — PSA: PSA: 0.01 ng/mL — AB (ref 0.10–4.00)

## 2013-07-26 MED ORDER — LANSOPRAZOLE 30 MG PO CPDR: DELAYED_RELEASE_CAPSULE | ORAL | Status: DC

## 2013-07-26 MED ORDER — GLYCOPYRROLATE 2 MG PO TABS: 2.0000 mg | ORAL_TABLET | Freq: Every day | ORAL | Status: DC

## 2013-07-26 MED ORDER — LOVASTATIN 20 MG PO TABS: 20.0000 mg | ORAL_TABLET | Freq: Every day | ORAL | Status: DC

## 2013-07-26 NOTE — Progress Notes (Signed)
Pre visit review using our clinic review tool, if applicable. No additional management support is needed unless otherwise documented below in the visit note. 

## 2013-07-26 NOTE — Assessment & Plan Note (Signed)
We discussed age appropriate health related issues, including available/recomended screening tests and vaccinations. We discussed a need for adhering to healthy diet and exercise. Labs/EKG were reviewed/ordered. All questions were answered. He will call Dr Henrene Pastor for a colonoscopy - due this year

## 2013-07-26 NOTE — Patient Instructions (Signed)
Cologuard test

## 2013-07-26 NOTE — Progress Notes (Signed)
   Subjective:    HPI  The patient is here for a wellness exam. The patient has been doing well overall without major physical or psychological issues going on lately.   Review of Systems  Constitutional: Negative for appetite change, fatigue and unexpected weight change.  HENT: Negative for congestion, nosebleeds, sneezing, sore throat and trouble swallowing.   Eyes: Negative for itching and visual disturbance.  Respiratory: Negative for cough.   Cardiovascular: Negative for chest pain, palpitations and leg swelling.  Gastrointestinal: Negative for nausea, diarrhea, blood in stool and abdominal distention.  Genitourinary: Negative for frequency and hematuria.  Musculoskeletal: Negative for back pain, gait problem, joint swelling and neck pain.  Skin: Negative for rash.  Neurological: Negative for dizziness, tremors, speech difficulty and weakness.  Psychiatric/Behavioral: Negative for sleep disturbance, dysphoric mood and agitation. The patient is not nervous/anxious.        Objective:   Physical Exam  Constitutional: He is oriented to person, place, and time. He appears well-developed and well-nourished. No distress.  HENT:  Head: Normocephalic and atraumatic.  Right Ear: External ear normal.  Left Ear: External ear normal.  Nose: Nose normal.  Mouth/Throat: Oropharynx is clear and moist. No oropharyngeal exudate.  Eyes: Conjunctivae and EOM are normal. Pupils are equal, round, and reactive to light. Right eye exhibits no discharge. Left eye exhibits no discharge. No scleral icterus.  Neck: Normal range of motion. Neck supple. No JVD present. No tracheal deviation present. No thyromegaly present.  Cardiovascular: Normal rate, regular rhythm, normal heart sounds and intact distal pulses.  Exam reveals no gallop and no friction rub.   No murmur heard. Pulmonary/Chest: Effort normal and breath sounds normal. No stridor. No respiratory distress. He has no wheezes. He has no rales.  He exhibits no tenderness.  Abdominal: Soft. Bowel sounds are normal. He exhibits no distension and no mass. There is no tenderness. There is no rebound and no guarding.  Genitourinary:  Rectal exam and genital exam - per Urology  Musculoskeletal: Normal range of motion. He exhibits no edema and no tenderness.  Lymphadenopathy:    He has no cervical adenopathy.  Neurological: He is alert and oriented to person, place, and time. He has normal reflexes. No cranial nerve deficit. He exhibits normal muscle tone. Coordination normal.  Skin: Skin is warm and dry. No rash noted. He is not diaphoretic. No erythema. No pallor.  Psychiatric: He has a normal mood and affect. His behavior is normal. Judgment and thought content normal.        Lab Results  Component Value Date   WBC 5.1 07/16/2012   HGB 15.1 07/16/2012   HCT 45.1 07/16/2012   PLT 182.0 07/16/2012   CHOL 159 07/16/2012   TRIG 89.0 07/16/2012   HDL 47.70 07/16/2012   ALT 16 07/16/2012   AST 18 07/16/2012   NA 141 07/16/2012   K 4.0 07/16/2012   CL 104 07/16/2012   CREATININE 0.8 07/16/2012   BUN 12 07/16/2012   CO2 32 07/16/2012   TSH 1.29 07/16/2012   PSA 0.00* 07/16/2012     Assessment & Plan:

## 2013-08-02 ENCOUNTER — Other Ambulatory Visit: Payer: Self-pay | Admitting: Internal Medicine

## 2013-11-09 ENCOUNTER — Ambulatory Visit (INDEPENDENT_AMBULATORY_CARE_PROVIDER_SITE_OTHER): Payer: Federal, State, Local not specified - PPO

## 2013-11-09 DIAGNOSIS — Z23 Encounter for immunization: Secondary | ICD-10-CM

## 2013-11-30 ENCOUNTER — Encounter: Payer: Self-pay | Admitting: Internal Medicine

## 2013-12-23 DIAGNOSIS — H2513 Age-related nuclear cataract, bilateral: Secondary | ICD-10-CM | POA: Diagnosis not present

## 2014-01-02 DIAGNOSIS — Z1211 Encounter for screening for malignant neoplasm of colon: Secondary | ICD-10-CM | POA: Diagnosis not present

## 2014-01-02 DIAGNOSIS — Z1212 Encounter for screening for malignant neoplasm of rectum: Secondary | ICD-10-CM | POA: Diagnosis not present

## 2014-01-02 LAB — COLOGUARD

## 2014-01-08 ENCOUNTER — Emergency Department (INDEPENDENT_AMBULATORY_CARE_PROVIDER_SITE_OTHER)
Admission: EM | Admit: 2014-01-08 | Discharge: 2014-01-08 | Disposition: A | Discharge status: Home / Self Care | Payer: Medicare Other | Source: Home / Self Care | Attending: Emergency Medicine | Admitting: Emergency Medicine

## 2014-01-08 ENCOUNTER — Encounter (HOSPITAL_COMMUNITY): Payer: Self-pay | Admitting: *Deleted

## 2014-01-08 DIAGNOSIS — J019 Acute sinusitis, unspecified: Secondary | ICD-10-CM | POA: Diagnosis not present

## 2014-01-08 DIAGNOSIS — J209 Acute bronchitis, unspecified: Secondary | ICD-10-CM

## 2014-01-08 DIAGNOSIS — J069 Acute upper respiratory infection, unspecified: Secondary | ICD-10-CM | POA: Diagnosis not present

## 2014-01-08 HISTORY — DX: Other cervical disc displacement, unspecified cervical region: M50.20

## 2014-01-08 MED ORDER — BENZONATATE 200 MG PO CAPS: 200.0000 mg | ORAL_CAPSULE | Freq: Three times a day (TID) | ORAL | Status: DC | PRN

## 2014-01-08 MED ORDER — PREDNISONE 20 MG PO TABS
20.0000 mg | ORAL_TABLET | Freq: Two times a day (BID) | ORAL | Status: DC
Start: 2014-01-08 — End: 2014-07-19

## 2014-01-08 MED ORDER — IPRATROPIUM BROMIDE 0.06 % NA SOLN: 2.0000 | Freq: Four times a day (QID) | NASAL | Status: DC

## 2014-01-08 MED ORDER — AMOXICILLIN 500 MG PO CAPS: 1000.0000 mg | ORAL_CAPSULE | Freq: Three times a day (TID) | ORAL | Status: DC

## 2014-01-08 NOTE — ED Notes (Signed)
C/o drainage, froggy throat and scratchy throat on 11/30.  Head congested, cough worse and both ears clogged up.  No fever.  C/o sinus pressure in forehead.

## 2014-01-08 NOTE — ED Provider Notes (Signed)
  Chief Complaint   URI   History of Present Illness   Brendan [name] is a 63 year old male who has had a one-week history of nasal congestion with clear drainage, headache, sinus pressure, ear congestion, scratchy throat, hoarseness, dry cough, and burning in the chest. He denies any fever, chills, or GI symptoms. He's had no exposures except a few weeks ago several his grandchildren had strep.  Review of Systems   Other than as noted above, the patient denies any of the following symptoms: Systemic:  No fevers, chills, sweats, or myalgias. Eye:  No redness or discharge. ENT:  No ear pain, headache, nasal congestion, drainage, sinus pressure, or sore throat. Neck:  No neck pain, stiffness, or swollen glands. Lungs:  No cough, sputum production, hemoptysis, wheezing, chest tightness, shortness of breath or chest pain. GI:  No abdominal pain, nausea, vomiting or diarrhea.  Fielding   Past medical history, family history, social history, meds, and allergies were reviewed. He is allergic to hydrocodone. He takes Prevacid, lovastatin, and Robinul Forte. He has irritable bowel syndrome, hypercholesterolemia, and GERD. He also has a history of prostate cancer.  Physical exam   Vital signs:  BP 142/81 mmHg  Pulse 105  Temp(Src) 98.4 F (36.9 C) (Oral)  Resp 12  SpO2 100% General:  Alert and oriented.  In no distress.  Skin warm and dry. Eye:  No conjunctival injection or drainage. Lids were normal. ENT:  TMs and canals were normal, without erythema or inflammation.  Nasal mucosa was clear and uncongested, without drainage.  Mucous membranes were moist.  Pharynx was clear with no exudate or drainage.  There were no oral ulcerations or lesions. Neck:  Supple, no adenopathy, tenderness or mass. Lungs:  No respiratory distress.  Lungs were clear to auscultation, without wheezes, rales or rhonchi.  Breath sounds were clear and equal bilaterally.  Heart:  Regular rhythm, without gallops,  murmers or rubs. Skin:  Clear, warm, and dry, without rash or lesions.   Assessment     The primary encounter diagnosis was Viral URI. Diagnoses of Acute sinusitis, recurrence not specified, unspecified location and Acute bronchitis, unspecified organism were also pertinent to this visit.  Plan    1.  Meds:  The following meds were prescribed:   New Prescriptions   AMOXICILLIN (AMOXIL) 500 MG CAPSULE    Take 2 capsules (1,000 mg total) by mouth 3 (three) times daily.   BENZONATATE (TESSALON) 200 MG CAPSULE    Take 1 capsule (200 mg total) by mouth 3 (three) times daily as needed for cough.   IPRATROPIUM (ATROVENT) 0.06 % NASAL SPRAY    Place 2 sprays into both nostrils 4 (four) times daily.   PREDNISONE (DELTASONE) 20 MG TABLET    Take 1 tablet (20 mg total) by mouth 2 (two) times daily.    2.  Patient Education/Counseling:  The patient was given appropriate handouts, self care instructions, and instructed in symptomatic relief.  Instructed to get extra fluids and extra rest.    3.  Follow up:  The patient was told to follow up here if no better in 3 to 4 days, or sooner if becoming worse in any way, and given some red flag symptoms such as increasing fever, difficulty breathing, chest pain, or persistent vomiting which would prompt immediate return.       [name], MD 01/08/14 [phone]

## 2014-01-08 NOTE — Discharge Instructions (Signed)

## 2014-01-25 ENCOUNTER — Telehealth: Payer: Self-pay | Admitting: *Deleted

## 2014-01-25 NOTE — Telephone Encounter (Signed)
11.29.15 Cologaurd cancer screening is negative. Pt informed

## 2014-01-31 ENCOUNTER — Encounter: Payer: Self-pay | Admitting: Internal Medicine

## 2014-02-01 ENCOUNTER — Telehealth: Payer: Self-pay | Admitting: Internal Medicine

## 2014-02-01 NOTE — Telephone Encounter (Signed)
Cologard ICD codes are needed: Z12.11 and Z12.12Company requests these two codes.  Please call  [phone] (pt ID# [id number])

## 2014-02-02 NOTE — Telephone Encounter (Signed)
Left detailed mess at number below informing Lilia Pro of codes.

## 2014-02-16 ENCOUNTER — Telehealth: Payer: Self-pay | Admitting: Internal Medicine

## 2014-02-16 DIAGNOSIS — Z125 Encounter for screening for malignant neoplasm of prostate: Secondary | ICD-10-CM

## 2014-02-16 DIAGNOSIS — Z0000 Encounter for general adult medical examination without abnormal findings: Secondary | ICD-10-CM

## 2014-02-16 NOTE — Telephone Encounter (Signed)
Is requesting labs to be entered before June CPE.  States this is per [employer].

## 2014-02-17 NOTE — Telephone Encounter (Signed)
Called pt spoke with wife inform labs entered...Brendan [name]

## 2014-02-17 NOTE — Telephone Encounter (Signed)
OK CMET, TSH, CBC, UA, Lipids, PSA Thx

## 2014-02-24 ENCOUNTER — Other Ambulatory Visit: Payer: Self-pay | Admitting: Dermatology

## 2014-02-24 DIAGNOSIS — Z85828 Personal history of other malignant neoplasm of skin: Secondary | ICD-10-CM | POA: Diagnosis not present

## 2014-02-24 DIAGNOSIS — L820 Inflamed seborrheic keratosis: Secondary | ICD-10-CM | POA: Diagnosis not present

## 2014-02-24 DIAGNOSIS — D485 Neoplasm of uncertain behavior of skin: Secondary | ICD-10-CM | POA: Diagnosis not present

## 2014-02-24 DIAGNOSIS — L821 Other seborrheic keratosis: Secondary | ICD-10-CM | POA: Diagnosis not present

## 2014-05-11 ENCOUNTER — Encounter: Payer: Self-pay | Admitting: Internal Medicine

## 2014-05-15 ENCOUNTER — Other Ambulatory Visit: Payer: Self-pay | Admitting: Internal Medicine

## 2014-05-15 DIAGNOSIS — H9193 Unspecified hearing loss, bilateral: Secondary | ICD-10-CM

## 2014-06-08 DIAGNOSIS — H9313 Tinnitus, bilateral: Secondary | ICD-10-CM | POA: Diagnosis not present

## 2014-06-08 DIAGNOSIS — H903 Sensorineural hearing loss, bilateral: Secondary | ICD-10-CM | POA: Diagnosis not present

## 2014-06-20 DIAGNOSIS — H9313 Tinnitus, bilateral: Secondary | ICD-10-CM | POA: Diagnosis not present

## 2014-06-20 DIAGNOSIS — H903 Sensorineural hearing loss, bilateral: Secondary | ICD-10-CM | POA: Diagnosis not present

## 2014-07-12 ENCOUNTER — Other Ambulatory Visit (INDEPENDENT_AMBULATORY_CARE_PROVIDER_SITE_OTHER): Payer: Medicare Other

## 2014-07-12 DIAGNOSIS — E785 Hyperlipidemia, unspecified: Secondary | ICD-10-CM

## 2014-07-12 DIAGNOSIS — Z0189 Encounter for other specified special examinations: Secondary | ICD-10-CM

## 2014-07-12 DIAGNOSIS — Z125 Encounter for screening for malignant neoplasm of prostate: Secondary | ICD-10-CM | POA: Diagnosis not present

## 2014-07-12 DIAGNOSIS — Z0000 Encounter for general adult medical examination without abnormal findings: Secondary | ICD-10-CM

## 2014-07-12 LAB — HEPATIC FUNCTION PANEL
ALBUMIN: 4.5 g/dL (ref 3.5–5.2)
ALT: 10 U/L (ref 0–53)
AST: 14 U/L (ref 0–37)
Alkaline Phosphatase: 54 U/L (ref 39–117)
BILIRUBIN DIRECT: 0.2 mg/dL (ref 0.0–0.3)
Total Bilirubin: 1 mg/dL (ref 0.2–1.2)
Total Protein: 6.4 g/dL (ref 6.0–8.3)

## 2014-07-12 LAB — BASIC METABOLIC PANEL
BUN: 14 mg/dL (ref 6–23)
CHLORIDE: 105 meq/L (ref 96–112)
CO2: 32 meq/L (ref 19–32)
Calcium: 9.2 mg/dL (ref 8.4–10.5)
Creatinine, Ser: 0.91 mg/dL (ref 0.40–1.50)
GFR: 88.71 mL/min (ref >60.00)
Glucose, Bld: 92 mg/dL (ref 70–99)
Potassium: 4.1 mEq/L (ref 3.5–5.1)
SODIUM: 141 meq/L (ref 135–145)

## 2014-07-12 LAB — CBC WITH DIFFERENTIAL/PLATELET
BASOS ABS: 0 10*3/uL (ref 0.0–0.1)
Basophils Relative: 0.6 % (ref 0.0–3.0)
Eosinophils Absolute: 0.3 10*3/uL (ref 0.0–0.7)
Eosinophils Relative: 6.7 % — ABNORMAL HIGH (ref 0.0–5.0)
HEMATOCRIT: 43 % (ref 39.0–52.0)
HEMOGLOBIN: 14.4 g/dL (ref 13.0–17.0)
LYMPHS PCT: 18.4 % (ref 12.0–46.0)
Lymphs Abs: 0.9 10*3/uL (ref 0.7–4.0)
MCHC: 33.3 g/dL (ref 30.0–36.0)
MCV: 87.1 fl (ref 78.0–100.0)
Monocytes Absolute: 0.4 10*3/uL (ref 0.1–1.0)
Monocytes Relative: 7.1 % (ref 3.0–12.0)
NEUTROS ABS: 3.5 10*3/uL (ref 1.4–7.7)
Neutrophils Relative %: 67.2 % (ref 43.0–77.0)
PLATELETS: 161 10*3/uL (ref 150.0–400.0)
RBC: 4.94 Mil/uL (ref 4.22–5.81)
RDW: 13.4 % (ref 11.5–15.5)
WBC: 5.2 10*3/uL (ref 4.0–10.5)

## 2014-07-12 LAB — URINALYSIS, ROUTINE W REFLEX MICROSCOPIC
Bilirubin Urine: NEGATIVE
HGB URINE DIPSTICK: NEGATIVE
Ketones, ur: NEGATIVE
LEUKOCYTES UA: NEGATIVE
Nitrite: NEGATIVE
Specific Gravity, Urine: 1.01 (ref 1.000–1.030)
Total Protein, Urine: NEGATIVE
UROBILINOGEN UA: 0.2 (ref 0.0–1.0)
Urine Glucose: NEGATIVE
pH: 7 (ref 5.0–8.0)

## 2014-07-12 LAB — LIPID PANEL
CHOLESTEROL: 144 mg/dL (ref 0–200)
HDL: 48.4 mg/dL (ref >39.00)
LDL CALC: 81 mg/dL (ref 0–99)
NonHDL: 95.6
Total CHOL/HDL Ratio: 3
Triglycerides: 75 mg/dL (ref 0.0–149.0)
VLDL: 15 mg/dL (ref 0.0–40.0)

## 2014-07-12 LAB — TSH: TSH: 1.09 u[IU]/mL (ref 0.35–4.50)

## 2014-07-12 LAB — PSA: PSA: 0.01 ng/mL — ABNORMAL LOW (ref 0.10–4.00)

## 2014-07-19 ENCOUNTER — Encounter: Payer: Self-pay | Admitting: Internal Medicine

## 2014-07-19 ENCOUNTER — Ambulatory Visit (INDEPENDENT_AMBULATORY_CARE_PROVIDER_SITE_OTHER): Payer: Medicare Other | Admitting: Internal Medicine

## 2014-07-19 VITALS — BP 128/78 | HR 74 | Temp 97.5°F | Ht 69.0 in | Wt 158.5 lb

## 2014-07-19 DIAGNOSIS — Z0000 Encounter for general adult medical examination without abnormal findings: Secondary | ICD-10-CM

## 2014-07-19 DIAGNOSIS — Z23 Encounter for immunization: Secondary | ICD-10-CM

## 2014-07-19 MED ORDER — LOVASTATIN 20 MG PO TABS: 20.0000 mg | ORAL_TABLET | Freq: Every day | ORAL | Status: DC

## 2014-07-19 MED ORDER — LANSOPRAZOLE 30 MG PO CPDR
30.0000 mg | DELAYED_RELEASE_CAPSULE | Freq: Every day | ORAL | Status: DC
Start: 2014-07-19 — End: 2015-07-27

## 2014-07-19 MED ORDER — GLYCOPYRROLATE 2 MG PO TABS: 2.0000 mg | ORAL_TABLET | Freq: Every day | ORAL | Status: DC

## 2014-07-19 NOTE — Patient Instructions (Signed)
Preventive Care for Adults A healthy lifestyle and preventive care can promote health and wellness. Preventive health guidelines for men include the following key practices:  A routine yearly physical is a good way to check with your health care provider about your health and preventative screening. It is a chance to share any concerns and updates on your health and to receive a thorough exam.  Visit your dentist for a routine exam and preventative care every 6 months. Brush your teeth twice a day and floss once a day. Good oral hygiene prevents tooth decay and gum disease.  The frequency of eye exams is based on your age, health, family medical history, use of contact lenses, and other factors. Follow your health care provider's recommendations for frequency of eye exams.  Eat a healthy diet. Foods such as vegetables, fruits, whole grains, low-fat dairy products, and lean protein foods contain the nutrients you need without too many calories. Decrease your intake of foods high in solid fats, added sugars, and salt. Eat the right amount of calories for you.Get information about a proper diet from your health care provider, if necessary.  Regular physical exercise is one of the most important things you can do for your health. Most adults should get at least 150 minutes of moderate-intensity exercise (any activity that increases your heart rate and causes you to sweat) each week. In addition, most adults need muscle-strengthening exercises on 2 or more days a week.  Maintain a healthy weight. The body mass index (BMI) is a screening tool to identify possible weight problems. It provides an estimate of body fat based on height and weight. Your health care provider can find your BMI and can help you achieve or maintain a healthy weight.For adults 20 years and older:  A BMI below 18.5 is considered underweight.  A BMI of 18.5 to 24.9 is normal.  A BMI of 25 to 29.9 is considered overweight.  A BMI  of 30 and above is considered obese.  Maintain normal blood lipids and cholesterol levels by exercising and minimizing your intake of saturated fat. Eat a balanced diet with plenty of fruit and vegetables. Blood tests for lipids and cholesterol should begin at age 50 and be repeated every 5 years. If your lipid or cholesterol levels are high, you are over 50, or you are at high risk for heart disease, you may need your cholesterol levels checked more frequently.Ongoing high lipid and cholesterol levels should be treated with medicines if diet and exercise are not working.  If you smoke, find out from your health care provider how to quit. If you do not use tobacco, do not start.  Lung cancer screening is recommended for adults aged 73-80 years who are at high risk for developing lung cancer because of a history of smoking. A yearly low-dose CT scan of the lungs is recommended for people who have at least a 30-pack-year history of smoking and are a current smoker or have quit within the past 15 years. A pack year of smoking is smoking an average of 1 pack of cigarettes a day for 1 year (for example: 1 pack a day for 30 years or 2 packs a day for 15 years). Yearly screening should continue until the smoker has stopped smoking for at least 15 years. Yearly screening should be stopped for people who develop a health problem that would prevent them from having lung cancer treatment.  If you choose to drink alcohol, do not have more than  2 drinks per day. One drink is considered to be 12 ounces (355 mL) of beer, 5 ounces (148 mL) of wine, or 1.5 ounces (44 mL) of liquor.  Avoid use of street drugs. Do not share needles with anyone. Ask for help if you need support or instructions about stopping the use of drugs.  High blood pressure causes heart disease and increases the risk of stroke. Your blood pressure should be checked at least every 1-2 years. Ongoing high blood pressure should be treated with  medicines, if weight loss and exercise are not effective.  If you are 45-79 years old, ask your health care provider if you should take aspirin to prevent heart disease.  Diabetes screening involves taking a blood sample to check your fasting blood sugar level. This should be done once every 3 years, after age 45, if you are within normal weight and without risk factors for diabetes. Testing should be considered at a younger age or be carried out more frequently if you are overweight and have at least 1 risk factor for diabetes.  Colorectal cancer can be detected and often prevented. Most routine colorectal cancer screening begins at the age of 50 and continues through age 75. However, your health care provider may recommend screening at an earlier age if you have risk factors for colon cancer. On a yearly basis, your health care provider may provide home test kits to check for hidden blood in the stool. Use of a small camera at the end of a tube to directly examine the colon (sigmoidoscopy or colonoscopy) can detect the earliest forms of colorectal cancer. Talk to your health care provider about this at age 50, when routine screening begins. Direct exam of the colon should be repeated every 5-10 years through age 75, unless early forms of precancerous polyps or small growths are found.  People who are at an increased risk for hepatitis B should be screened for this virus. You are considered at high risk for hepatitis B if:  You were born in a country where hepatitis B occurs often. Talk with your health care provider about which countries are considered high risk.  Your parents were born in a high-risk country and you have not received a shot to protect against hepatitis B (hepatitis B vaccine).  You have HIV or AIDS.  You use needles to inject street drugs.  You live with, or have sex with, someone who has hepatitis B.  You are a man who has sex with other men (MSM).  You get hemodialysis  treatment.  You take certain medicines for conditions such as cancer, organ transplantation, and autoimmune conditions.  Hepatitis C blood testing is recommended for all people born from 1945 through 1965 and any individual with known risks for hepatitis C.  Practice safe sex. Use condoms and avoid high-risk sexual practices to reduce the spread of sexually transmitted infections (STIs). STIs include gonorrhea, chlamydia, syphilis, trichomonas, herpes, HPV, and human immunodeficiency virus (HIV). Herpes, HIV, and HPV are viral illnesses that have no cure. They can result in disability, cancer, and death.  If you are at risk of being infected with HIV, it is recommended that you take a prescription medicine daily to prevent HIV infection. This is called preexposure prophylaxis (PrEP). You are considered at risk if:  You are a man who has sex with other men (MSM) and have other risk factors.  You are a heterosexual man, are sexually active, and are at increased risk for HIV infection.    You take drugs by injection.  You are sexually active with a partner who has HIV.  Talk with your health care provider about whether you are at high risk of being infected with HIV. If you choose to begin PrEP, you should first be tested for HIV. You should then be tested every 3 months for as long as you are taking PrEP.  A one-time screening for abdominal aortic aneurysm (AAA) and surgical repair of large AAAs by ultrasound are recommended for men ages 32 to 67 years who are current or former smokers.  Healthy men should no longer receive prostate-specific antigen (PSA) blood tests as part of routine cancer screening. Talk with your health care provider about prostate cancer screening.  Testicular cancer screening is not recommended for adult males who have no symptoms. Screening includes self-exam, a health care provider exam, and other screening tests. Consult with your health care provider about any symptoms  you have or any concerns you have about testicular cancer.  Use sunscreen. Apply sunscreen liberally and repeatedly throughout the day. You should seek shade when your shadow is shorter than you. Protect yourself by wearing long sleeves, pants, a wide-brimmed hat, and sunglasses year round, whenever you are outdoors.  Once a month, do a whole-body skin exam, using a mirror to look at the skin on your back. Tell your health care provider about new moles, moles that have irregular borders, moles that are larger than a pencil eraser, or moles that have changed in shape or color.  Stay current with required vaccines (immunizations).  Influenza vaccine. All adults should be immunized every year.  Tetanus, diphtheria, and acellular pertussis (Td, Tdap) vaccine. An adult who has not previously received Tdap or who does not know his vaccine status should receive 1 dose of Tdap. This initial dose should be followed by tetanus and diphtheria toxoids (Td) booster doses every 10 years. Adults with an unknown or incomplete history of completing a 3-dose immunization series with Td-containing vaccines should begin or complete a primary immunization series including a Tdap dose. Adults should receive a Td booster every 10 years.  Varicella vaccine. An adult without evidence of immunity to varicella should receive 2 doses or a second dose if he has previously received 1 dose.  Human papillomavirus (HPV) vaccine. Males aged 68-21 years who have not received the vaccine previously should receive the 3-dose series. Males aged 22-26 years may be immunized. Immunization is recommended through the age of 6 years for any male who has sex with males and did not get any or all doses earlier. Immunization is recommended for any person with an immunocompromised condition through the age of 49 years if he did not get any or all doses earlier. During the 3-dose series, the second dose should be obtained 4-8 weeks after the first  dose. The third dose should be obtained 24 weeks after the first dose and 16 weeks after the second dose.  Zoster vaccine. One dose is recommended for adults aged 50 years or older unless certain conditions are present.  Measles, mumps, and rubella (MMR) vaccine. Adults born before [birth year] generally are considered immune to measles and mumps. Adults born in [birth year] or later should have 1 or more doses of MMR vaccine unless there is a contraindication to the vaccine or there is laboratory evidence of immunity to each of the three diseases. A routine second dose of MMR vaccine should be obtained at least 28 days after the first dose for students attending postsecondary  schools, health care workers, or international travelers. People who received inactivated measles vaccine or an unknown type of measles vaccine during 1963-1967 should receive 2 doses of MMR vaccine. People who received inactivated mumps vaccine or an unknown type of mumps vaccine before 1979 and are at high risk for mumps infection should consider immunization with 2 doses of MMR vaccine. Unvaccinated health care workers born before 1957 who lack laboratory evidence of measles, mumps, or rubella immunity or laboratory confirmation of disease should consider measles and mumps immunization with 2 doses of MMR vaccine or rubella immunization with 1 dose of MMR vaccine.  Pneumococcal 13-valent conjugate (PCV13) vaccine. When indicated, a person who is uncertain of his immunization history and has no record of immunization should receive the PCV13 vaccine. An adult aged 19 years or older who has certain medical conditions and has not been previously immunized should receive 1 dose of PCV13 vaccine. This PCV13 should be followed with a dose of pneumococcal polysaccharide (PPSV23) vaccine. The PPSV23 vaccine dose should be obtained at least 8 weeks after the dose of PCV13 vaccine. An adult aged 19 years or older who has certain medical conditions and  previously received 1 or more doses of PPSV23 vaccine should receive 1 dose of PCV13. The PCV13 vaccine dose should be obtained 1 or more years after the last PPSV23 vaccine dose.  Pneumococcal polysaccharide (PPSV23) vaccine. When PCV13 is also indicated, PCV13 should be obtained first. All adults aged 65 years and older should be immunized. An adult younger than age 65 years who has certain medical conditions should be immunized. Any person who resides in a nursing home or long-term care facility should be immunized. An adult smoker should be immunized. People with an immunocompromised condition and certain other conditions should receive both PCV13 and PPSV23 vaccines. People with human immunodeficiency virus (HIV) infection should be immunized as soon as possible after diagnosis. Immunization during chemotherapy or radiation therapy should be avoided. Routine use of PPSV23 vaccine is not recommended for American Indians, [place] Natives, or people younger than 65 years unless there are medical conditions that require PPSV23 vaccine. When indicated, people who have unknown immunization and have no record of immunization should receive PPSV23 vaccine. One-time revaccination 5 years after the first dose of PPSV23 is recommended for people aged 19-64 years who have chronic kidney failure, nephrotic syndrome, asplenia, or immunocompromised conditions. People who received 1-2 doses of PPSV23 before age 65 years should receive another dose of PPSV23 vaccine at age 65 years or later if at least 5 years have passed since the previous dose. Doses of PPSV23 are not needed for people immunized with PPSV23 at or after age 65 years.  Meningococcal vaccine. Adults with asplenia or persistent complement component deficiencies should receive 2 doses of quadrivalent meningococcal conjugate (MenACWY-D) vaccine. The doses should be obtained at least 2 months apart. Microbiologists working with certain meningococcal bacteria,  military recruits, people at risk during an outbreak, and people who travel to or live in countries with a high rate of meningitis should be immunized. A first-year college student up through age 21 years who is living in a residence hall should receive a dose if he did not receive a dose on or after his 16th birthday. Adults who have certain high-risk conditions should receive one or more doses of vaccine.  Hepatitis A vaccine. Adults who wish to be protected from this disease, have certain high-risk conditions, work with hepatitis A-infected animals, work in hepatitis A research labs, or   travel to or work in countries with a high rate of hepatitis A should be immunized. Adults who were previously unvaccinated and who anticipate close contact with an international adoptee during the first 60 days after arrival in the [place] from a country with a high rate of hepatitis A should be immunized.  Hepatitis B vaccine. Adults should be immunized if they wish to be protected from this disease, have certain high-risk conditions, may be exposed to blood or other infectious body fluids, are household contacts or sex partners of hepatitis B positive people, are clients or workers in certain care facilities, or travel to or work in countries with a high rate of hepatitis B.  Haemophilus influenzae type b (Hib) vaccine. A previously unvaccinated person with asplenia or sickle cell disease or having a scheduled splenectomy should receive 1 dose of Hib vaccine. Regardless of previous immunization, a recipient of a hematopoietic stem cell transplant should receive a 3-dose series 6-12 months after his successful transplant. Hib vaccine is not recommended for adults with HIV infection. Preventive Service / Frequency Ages 52 to 17  Blood pressure check.** / Every 1 to 2 years.  Lipid and cholesterol check.** / Every 5 years beginning at age 69.  Hepatitis C blood test.** / For any individual with known risks for  hepatitis C.  Skin self-exam. / Monthly.  Influenza vaccine. / Every year.  Tetanus, diphtheria, and acellular pertussis (Tdap, Td) vaccine.** / Consult your health care provider. 1 dose of Td every 10 years.  Varicella vaccine.** / Consult your health care provider.  HPV vaccine. / 3 doses over 6 months, if 72 or younger.  Measles, mumps, rubella (MMR) vaccine.** / You need at least 1 dose of MMR if you were born in 1957 or later. You may also need a second dose.  Pneumococcal 13-valent conjugate (PCV13) vaccine.** / Consult your health care provider.  Pneumococcal polysaccharide (PPSV23) vaccine.** / 1 to 2 doses if you smoke cigarettes or if you have certain conditions.  Meningococcal vaccine.** / 1 dose if you are age 35 to 60 years and a Market researcher living in a residence hall, or have one of several medical conditions. You may also need additional booster doses.  Hepatitis A vaccine.** / Consult your health care provider.  Hepatitis B vaccine.** / Consult your health care provider.  Haemophilus influenzae type b (Hib) vaccine.** / Consult your health care provider. Ages 35 to 8  Blood pressure check.** / Every 1 to 2 years.  Lipid and cholesterol check.** / Every 5 years beginning at age 57.  Lung cancer screening. / Every year if you are aged 44-80 years and have a 30-pack-year history of smoking and currently smoke or have quit within the past 15 years. Yearly screening is stopped once you have quit smoking for at least 15 years or develop a health problem that would prevent you from having lung cancer treatment.  Fecal occult blood test (FOBT) of stool. / Every year beginning at age 55 and continuing until age 73. You may not have to do this test if you get a colonoscopy every 10 years.  Flexible sigmoidoscopy** or colonoscopy.** / Every 5 years for a flexible sigmoidoscopy or every 10 years for a colonoscopy beginning at age 28 and continuing until age  1.  Hepatitis C blood test.** / For all people born from [birth year] through 1965 and any individual with known risks for hepatitis C.  Skin self-exam. / Monthly.  Influenza vaccine. / Every  year.  Tetanus, diphtheria, and acellular pertussis (Tdap/Td) vaccine.** / Consult your health care provider. 1 dose of Td every 10 years.  Varicella vaccine.** / Consult your health care provider.  Zoster vaccine.** / 1 dose for adults aged 53 years or older.  Measles, mumps, rubella (MMR) vaccine.** / You need at least 1 dose of MMR if you were born in 1957 or later. You may also need a second dose.  Pneumococcal 13-valent conjugate (PCV13) vaccine.** / Consult your health care provider.  Pneumococcal polysaccharide (PPSV23) vaccine.** / 1 to 2 doses if you smoke cigarettes or if you have certain conditions.  Meningococcal vaccine.** / Consult your health care provider.  Hepatitis A vaccine.** / Consult your health care provider.  Hepatitis B vaccine.** / Consult your health care provider.  Haemophilus influenzae type b (Hib) vaccine.** / Consult your health care provider. Ages 77 and over  Blood pressure check.** / Every 1 to 2 years.  Lipid and cholesterol check.**/ Every 5 years beginning at age 85.  Lung cancer screening. / Every year if you are aged 55-80 years and have a 30-pack-year history of smoking and currently smoke or have quit within the past 15 years. Yearly screening is stopped once you have quit smoking for at least 15 years or develop a health problem that would prevent you from having lung cancer treatment.  Fecal occult blood test (FOBT) of stool. / Every year beginning at age 33 and continuing until age 11. You may not have to do this test if you get a colonoscopy every 10 years.  Flexible sigmoidoscopy** or colonoscopy.** / Every 5 years for a flexible sigmoidoscopy or every 10 years for a colonoscopy beginning at age 28 and continuing until age 73.  Hepatitis C blood  test.** / For all people born from [birth year] through 1965 and any individual with known risks for hepatitis C.  Abdominal aortic aneurysm (AAA) screening.** / A one-time screening for ages 50 to 27 years who are current or former smokers.  Skin self-exam. / Monthly.  Influenza vaccine. / Every year.  Tetanus, diphtheria, and acellular pertussis (Tdap/Td) vaccine.** / 1 dose of Td every 10 years.  Varicella vaccine.** / Consult your health care provider.  Zoster vaccine.** / 1 dose for adults aged 34 years or older.  Pneumococcal 13-valent conjugate (PCV13) vaccine.** / Consult your health care provider.  Pneumococcal polysaccharide (PPSV23) vaccine.** / 1 dose for all adults aged 63 years and older.  Meningococcal vaccine.** / Consult your health care provider.  Hepatitis A vaccine.** / Consult your health care provider.  Hepatitis B vaccine.** / Consult your health care provider.  Haemophilus influenzae type b (Hib) vaccine.** / Consult your health care provider. **Family history and personal history of risk and conditions may change your health care provider's recommendations. Document Released: 03/19/2001 Document Revised: 01/26/2013 Document Reviewed: 06/18/2010 [facility] Patient Information 2015 [place], [place]. This information is not intended to replace advice given to you by your health care provider. Make sure you discuss any questions you have with your health care provider.

## 2014-07-19 NOTE — Assessment & Plan Note (Signed)
Here for medicare wellness/physical  Diet: heart healthy  Physical activity: sedentary  Depression/mood screen: negative  Hearing: intact to whispered voice  Visual acuity: grossly normal, performs annual eye exam  ADLs: capable  Fall risk: none  Home safety: good  Cognitive evaluation: intact to orientation, naming, recall and repetition  EOL planning: adv directives, full code/ I agree  I have personally reviewed and have noted  1. The patient's medical, surgical and social history  2. Their use of alcohol, tobacco or illicit drugs  3. Their current medications and supplements  4. The patient's functional ability including ADL's, fall risks, home safety risks and hearing or visual impairment.  5. Diet and physical activities  6. Evidence for depression or mood disorders 7. The roster of all physicians providing medical care to patient - is listed in the Snapshot section of the chart and reviewed today.    Today patient counseled on age appropriate routine health concerns for screening and prevention, each reviewed and up to date or declined. Immunizations reviewed and up to date or declined. Labs ordered and reviewed. Risk factors for depression reviewed and negative. Hearing function and visual acuity are intact. ADLs screened and addressed as needed. Functional ability and level of safety reviewed and appropriate. Education, counseling and referrals performed based on assessed risks today. Patient provided with a copy of personalized plan for preventive services.

## 2014-07-19 NOTE — Progress Notes (Signed)
   Subjective:    HPI  The patient is here for a wellness exam. The patient has been doing well overall without major physical or psychological issues going on lately.   Review of Systems  Constitutional: Negative for appetite change, fatigue and unexpected weight change.  HENT: Negative for congestion, nosebleeds, sneezing, sore throat and trouble swallowing.   Eyes: Negative for itching and visual disturbance.  Respiratory: Negative for cough.   Cardiovascular: Negative for chest pain, palpitations and leg swelling.  Gastrointestinal: Negative for nausea, diarrhea, blood in stool and abdominal distention.  Genitourinary: Negative for frequency and hematuria.  Musculoskeletal: Negative for back pain, joint swelling, gait problem and neck pain.  Skin: Negative for rash.  Neurological: Negative for dizziness, tremors, speech difficulty and weakness.  Psychiatric/Behavioral: Negative for sleep disturbance, dysphoric mood and agitation. The patient is not nervous/anxious.    BP Readings from Last 3 Encounters:  07/19/14 128/78  01/08/14 142/81  07/26/13 112/82   Wt Readings from Last 3 Encounters:  07/19/14 158 lb 8 oz (71.895 kg)  07/26/13 160 lb (72.576 kg)  01/18/13 166 lb (75.297 kg)        Objective:   Physical Exam  Constitutional: He is oriented to person, place, and time. He appears well-developed and well-nourished. No distress.  HENT:  Head: Normocephalic and atraumatic.  Right Ear: External ear normal.  Left Ear: External ear normal.  Nose: Nose normal.  Mouth/Throat: Oropharynx is clear and moist. No oropharyngeal exudate.  Eyes: Conjunctivae and EOM are normal. Pupils are equal, round, and reactive to light. Right eye exhibits no discharge. Left eye exhibits no discharge. No scleral icterus.  Neck: Normal range of motion. Neck supple. No JVD present. No tracheal deviation present. No thyromegaly present.  Cardiovascular: Normal rate, regular rhythm, normal  heart sounds and intact distal pulses.  Exam reveals no gallop and no friction rub.   No murmur heard. Pulmonary/Chest: Effort normal and breath sounds normal. No stridor. No respiratory distress. He has no wheezes. He has no rales. He exhibits no tenderness.  Abdominal: Soft. Bowel sounds are normal. He exhibits no distension and no mass. There is no tenderness. There is no rebound and no guarding.  Genitourinary:  Rectal exam and genital exam - per Urology  Musculoskeletal: Normal range of motion. He exhibits no edema or tenderness.  Lymphadenopathy:    He has no cervical adenopathy.  Neurological: He is alert and oriented to person, place, and time. He has normal reflexes. No cranial nerve deficit. He exhibits normal muscle tone. Coordination normal.  Skin: Skin is warm and dry. No rash noted. He is not diaphoretic. No erythema. No pallor.  Psychiatric: He has a normal mood and affect. His behavior is normal. Judgment and thought content normal.        Lab Results  Component Value Date   WBC 5.2 07/12/2014   HGB 14.4 07/12/2014   HCT 43.0 07/12/2014   PLT 161.0 07/12/2014   CHOL 144 07/12/2014   TRIG 75.0 07/12/2014   HDL 48.40 07/12/2014   ALT 10 07/12/2014   AST 14 07/12/2014   NA 141 07/12/2014   K 4.1 07/12/2014   CL 105 07/12/2014   CREATININE 0.91 07/12/2014   BUN 14 07/12/2014   CO2 32 07/12/2014   TSH 1.09 07/12/2014   PSA 0.01* 07/12/2014     Assessment & Plan:

## 2014-07-19 NOTE — Progress Notes (Signed)
Pre visit review using our clinic review tool, if applicable. No additional management support is needed unless otherwise documented below in the visit note. 

## 2014-08-01 DIAGNOSIS — Z85828 Personal history of other malignant neoplasm of skin: Secondary | ICD-10-CM | POA: Diagnosis not present

## 2014-08-01 DIAGNOSIS — L438 Other lichen planus: Secondary | ICD-10-CM | POA: Diagnosis not present

## 2014-08-01 DIAGNOSIS — D1801 Hemangioma of skin and subcutaneous tissue: Secondary | ICD-10-CM | POA: Diagnosis not present

## 2014-08-01 DIAGNOSIS — D225 Melanocytic nevi of trunk: Secondary | ICD-10-CM | POA: Diagnosis not present

## 2014-08-01 DIAGNOSIS — L821 Other seborrheic keratosis: Secondary | ICD-10-CM | POA: Diagnosis not present

## 2014-08-01 DIAGNOSIS — L218 Other seborrheic dermatitis: Secondary | ICD-10-CM | POA: Diagnosis not present

## 2014-10-12 DIAGNOSIS — N5201 Erectile dysfunction due to arterial insufficiency: Secondary | ICD-10-CM | POA: Diagnosis not present

## 2014-10-12 DIAGNOSIS — C61 Malignant neoplasm of prostate: Secondary | ICD-10-CM | POA: Diagnosis not present

## 2014-11-08 ENCOUNTER — Ambulatory Visit: Payer: Medicare Other

## 2014-11-10 ENCOUNTER — Ambulatory Visit (INDEPENDENT_AMBULATORY_CARE_PROVIDER_SITE_OTHER): Payer: Medicare Other

## 2014-11-10 DIAGNOSIS — Z23 Encounter for immunization: Secondary | ICD-10-CM | POA: Diagnosis not present

## 2014-12-26 DIAGNOSIS — H2513 Age-related nuclear cataract, bilateral: Secondary | ICD-10-CM | POA: Diagnosis not present

## 2015-03-29 ENCOUNTER — Encounter: Payer: Self-pay | Admitting: Internal Medicine

## 2015-03-29 ENCOUNTER — Ambulatory Visit: Payer: Federal, State, Local not specified - PPO | Admitting: Internal Medicine

## 2015-03-29 ENCOUNTER — Ambulatory Visit (INDEPENDENT_AMBULATORY_CARE_PROVIDER_SITE_OTHER): Payer: Medicare Other | Admitting: Internal Medicine

## 2015-03-29 VITALS — BP 112/80 | HR 87 | Temp 98.4°F | Wt 159.0 lb

## 2015-03-29 DIAGNOSIS — J069 Acute upper respiratory infection, unspecified: Secondary | ICD-10-CM

## 2015-03-29 MED ORDER — PROMETHAZINE-CODEINE 6.25-10 MG/5ML PO SYRP: 5.0000 mL | ORAL_SOLUTION | ORAL | Status: DC | PRN

## 2015-03-29 MED ORDER — AZITHROMYCIN 250 MG PO TABS: ORAL_TABLET | ORAL | Status: DC

## 2015-03-29 NOTE — Progress Notes (Signed)
Subjective:  Patient ID: Brendan [name], male    DOB: Aug 27, 1950  Age: 65 y.o. MRN: [id number]  CC: No chief complaint on file.   HPI Brendan [name] presents for URI sx's x 3 d  Outpatient Prescriptions Prior to Visit  Medication Sig Dispense Refill  . Cholecalciferol 1000 UNITS tablet Take 1,000 Units by mouth daily.      Marland Kitchen glycopyrrolate (ROBINUL) 2 MG tablet Take 1 tablet (2 mg total) by mouth daily. 90 tablet 3  . lansoprazole (PREVACID) 30 MG capsule Take 1 capsule (30 mg total) by mouth daily. 90 capsule 3  . lovastatin (MEVACOR) 20 MG tablet Take 1 tablet (20 mg total) by mouth daily. 90 tablet 3  . NON FORMULARY 4 mg as directed. Chlorpheniramine maleate     No facility-administered medications prior to visit.    ROS Review of Systems  Constitutional: Positive for fever and chills. Negative for appetite change, fatigue and unexpected weight change.  HENT: Positive for postnasal drip, rhinorrhea and sinus pressure. Negative for congestion, nosebleeds, sneezing, sore throat and trouble swallowing.   Eyes: Negative for itching and visual disturbance.  Respiratory: Positive for cough. Negative for chest tightness and shortness of breath.   Cardiovascular: Negative for chest pain, palpitations and leg swelling.  Gastrointestinal: Negative for nausea, diarrhea, blood in stool and abdominal distention.  Genitourinary: Negative for frequency and hematuria.  Musculoskeletal: Negative for back pain, joint swelling, gait problem and neck pain.  Skin: Negative for rash.  Neurological: Negative for dizziness, tremors, speech difficulty and weakness.  Psychiatric/Behavioral: Negative for sleep disturbance, dysphoric mood and agitation. The patient is not nervous/anxious.     Objective:  BP 112/80 mmHg  Pulse 87  Temp(Src) 98.4 F (36.9 C) (Oral)  Wt 159 lb (72.122 kg)  SpO2 97%  BP Readings from Last 3 Encounters:  03/29/15 112/80  07/19/14 128/78  01/08/14 142/81     Wt Readings from Last 3 Encounters:  03/29/15 159 lb (72.122 kg)  07/19/14 158 lb 8 oz (71.895 kg)  07/26/13 160 lb (72.576 kg)    Physical Exam  Constitutional: He is oriented to person, place, and time. He appears well-developed. No distress.  NAD  HENT:  Mouth/Throat: Oropharynx is clear and moist.  Eyes: Conjunctivae are normal. Pupils are equal, round, and reactive to light.  Neck: Normal range of motion. No JVD present. No thyromegaly present.  Cardiovascular: Normal rate, regular rhythm, normal heart sounds and intact distal pulses.  Exam reveals no gallop and no friction rub.   No murmur heard. Pulmonary/Chest: Effort normal and breath sounds normal. No respiratory distress. He has no wheezes. He has no rales. He exhibits no tenderness.  Abdominal: Soft. Bowel sounds are normal. He exhibits no distension and no mass. There is no tenderness. There is no rebound and no guarding.  Musculoskeletal: Normal range of motion. He exhibits no edema or tenderness.  Lymphadenopathy:    He has no cervical adenopathy.  Neurological: He is alert and oriented to person, place, and time. He has normal reflexes. No cranial nerve deficit. He exhibits normal muscle tone. He displays a negative Romberg sign. Coordination and gait normal.  Skin: Skin is warm and dry. No rash noted.  Psychiatric: He has a normal mood and affect. His behavior is normal. Judgment and thought content normal.  eryth throat  Lab Results  Component Value Date   WBC 5.2 07/12/2014   HGB 14.4 07/12/2014   HCT 43.0 07/12/2014   PLT 161.0  07/12/2014   GLUCOSE 92 07/12/2014   CHOL 144 07/12/2014   TRIG 75.0 07/12/2014   HDL 48.40 07/12/2014   LDLCALC 81 07/12/2014   ALT 10 07/12/2014   AST 14 07/12/2014   NA 141 07/12/2014   K 4.1 07/12/2014   CL 105 07/12/2014   CREATININE 0.91 07/12/2014   BUN 14 07/12/2014   CO2 32 07/12/2014   TSH 1.09 07/12/2014   PSA 0.01* 07/12/2014    No results  found.  Assessment & Plan:   There are no diagnoses linked to this encounter. I am having Brendan [name] maintain his NON FORMULARY, Cholecalciferol, glycopyrrolate, lansoprazole, and lovastatin.  No orders of the defined types were placed in this encounter.     Follow-up: No Follow-up on file.  Walker Kehr, MD

## 2015-03-29 NOTE — Progress Notes (Signed)
Pre visit review using our clinic review tool, if applicable. No additional management support is needed unless otherwise documented below in the visit note. 

## 2015-03-29 NOTE — Patient Instructions (Signed)
Use over-the-counter  "cold" medicines  such as "Tylenol cold" , "Advil cold",  "Mucinex" or" Mucinex D"  for cough and congestion.   Avoid decongestants if you have high blood pressure and use "Afrin" nasal spray for nasal congestion as directed instead. Use" Delsym" or" Robitussin" cough syrup varietis for cough.  You can use plain "Tylenol" or "Advil" for fever, chills and achyness. Use Halls or Ricola cough drops.   "Common cold" symptoms are usually triggered by a virus.  The antibiotics are usually not necessary. On average, a" viral cold" illness would take 4-7 days to resolve. Please, make an appointment if you are not better or if you're worse.  

## 2015-07-20 ENCOUNTER — Other Ambulatory Visit: Payer: Medicare Other

## 2015-07-27 ENCOUNTER — Other Ambulatory Visit (INDEPENDENT_AMBULATORY_CARE_PROVIDER_SITE_OTHER): Payer: Medicare Other

## 2015-07-27 ENCOUNTER — Encounter: Payer: Self-pay | Admitting: Internal Medicine

## 2015-07-27 ENCOUNTER — Ambulatory Visit (INDEPENDENT_AMBULATORY_CARE_PROVIDER_SITE_OTHER): Payer: Medicare Other | Admitting: Internal Medicine

## 2015-07-27 ENCOUNTER — Encounter: Payer: Medicare Other | Admitting: Internal Medicine

## 2015-07-27 VITALS — BP 114/74 | HR 72 | Ht 69.0 in | Wt 159.0 lb

## 2015-07-27 DIAGNOSIS — Z0000 Encounter for general adult medical examination without abnormal findings: Secondary | ICD-10-CM

## 2015-07-27 DIAGNOSIS — Z8546 Personal history of malignant neoplasm of prostate: Secondary | ICD-10-CM

## 2015-07-27 DIAGNOSIS — K219 Gastro-esophageal reflux disease without esophagitis: Secondary | ICD-10-CM | POA: Diagnosis not present

## 2015-07-27 DIAGNOSIS — E785 Hyperlipidemia, unspecified: Secondary | ICD-10-CM

## 2015-07-27 DIAGNOSIS — Z23 Encounter for immunization: Secondary | ICD-10-CM | POA: Diagnosis not present

## 2015-07-27 LAB — CBC WITH DIFFERENTIAL/PLATELET
BASOS ABS: 0 10*3/uL (ref 0.0–0.1)
Basophils Relative: 0.4 % (ref 0.0–3.0)
Eosinophils Absolute: 0.2 10*3/uL (ref 0.0–0.7)
Eosinophils Relative: 4 % (ref 0.0–5.0)
HEMATOCRIT: 44.8 % (ref 39.0–52.0)
Hemoglobin: 15.2 g/dL (ref 13.0–17.0)
Lymphocytes Relative: 30.7 % (ref 12.0–46.0)
Lymphs Abs: 1.5 10*3/uL (ref 0.7–4.0)
MCHC: 34 g/dL (ref 30.0–36.0)
MCV: 87 fl (ref 78.0–100.0)
MONOS PCT: 8.4 % (ref 3.0–12.0)
Monocytes Absolute: 0.4 10*3/uL (ref 0.1–1.0)
NEUTROS ABS: 2.8 10*3/uL (ref 1.4–7.7)
Neutrophils Relative %: 56.5 % (ref 43.0–77.0)
Platelets: 181 10*3/uL (ref 150.0–400.0)
RBC: 5.15 Mil/uL (ref 4.22–5.81)
RDW: 13.3 % (ref 11.5–15.5)
WBC: 5 10*3/uL (ref 4.0–10.5)

## 2015-07-27 LAB — LIPID PANEL
CHOLESTEROL: 153 mg/dL (ref 0–200)
HDL: 54.8 mg/dL (ref >39.00)
LDL Cholesterol: 82 mg/dL (ref 0–99)
NONHDL: 98.41
Total CHOL/HDL Ratio: 3
Triglycerides: 81 mg/dL (ref 0.0–149.0)
VLDL: 16.2 mg/dL (ref 0.0–40.0)

## 2015-07-27 LAB — HEPATIC FUNCTION PANEL
ALT: 12 U/L (ref 0–53)
AST: 17 U/L (ref 0–37)
Albumin: 4.7 g/dL (ref 3.5–5.2)
Alkaline Phosphatase: 51 U/L (ref 39–117)
Bilirubin, Direct: 0.1 mg/dL (ref 0.0–0.3)
Total Bilirubin: 0.8 mg/dL (ref 0.2–1.2)
Total Protein: 6.6 g/dL (ref 6.0–8.3)

## 2015-07-27 LAB — URINALYSIS
Bilirubin Urine: NEGATIVE
Hgb urine dipstick: NEGATIVE
Ketones, ur: NEGATIVE
Leukocytes, UA: NEGATIVE
Nitrite: NEGATIVE
Specific Gravity, Urine: <=1.005 — AB (ref 1.000–1.030)
Total Protein, Urine: NEGATIVE
URINE GLUCOSE: NEGATIVE
UROBILINOGEN UA: 0.2 (ref 0.0–1.0)
pH: 6.5 (ref 5.0–8.0)

## 2015-07-27 LAB — BASIC METABOLIC PANEL
BUN: 11 mg/dL (ref 6–23)
CHLORIDE: 106 meq/L (ref 96–112)
CO2: 34 meq/L — AB (ref 19–32)
CREATININE: 0.92 mg/dL (ref 0.40–1.50)
Calcium: 9.6 mg/dL (ref 8.4–10.5)
GFR: 87.32 mL/min (ref >60.00)
Glucose, Bld: 89 mg/dL (ref 70–99)
Potassium: 4.7 mEq/L (ref 3.5–5.1)
Sodium: 140 mEq/L (ref 135–145)

## 2015-07-27 LAB — PSA: PSA: 0.01 ng/mL — ABNORMAL LOW (ref 0.10–4.00)

## 2015-07-27 LAB — TSH: TSH: 1.25 u[IU]/mL (ref 0.35–4.50)

## 2015-07-27 MED ORDER — LOVASTATIN 20 MG PO TABS: 20.0000 mg | ORAL_TABLET | Freq: Every day | ORAL | Status: DC

## 2015-07-27 MED ORDER — GLYCOPYRROLATE 2 MG PO TABS: 2.0000 mg | ORAL_TABLET | Freq: Every day | ORAL | Status: DC

## 2015-07-27 MED ORDER — LANSOPRAZOLE 30 MG PO CPDR: 30.0000 mg | DELAYED_RELEASE_CAPSULE | Freq: Every day | ORAL | Status: DC

## 2015-07-27 NOTE — Patient Instructions (Signed)

## 2015-07-27 NOTE — Assessment & Plan Note (Signed)
Prevacid  

## 2015-07-27 NOTE — Assessment & Plan Note (Signed)
Labs On Lovastatin

## 2015-07-27 NOTE — Progress Notes (Signed)
Pre visit review using our clinic review tool, if applicable. No additional management support is needed unless otherwise documented below in the visit note. 

## 2015-07-27 NOTE — Assessment & Plan Note (Signed)
PSA

## 2015-07-27 NOTE — Progress Notes (Signed)
Subjective:  Patient ID: Brendan [name], male    DOB: January 30, 1951  Age: 65 y.o. MRN: [id number]  CC: Annual Exam   HPI Brendan [name] presents for well exam  Outpatient Prescriptions Prior to Visit  Medication Sig Dispense Refill  . Cholecalciferol 1000 UNITS tablet Take 1,000 Units by mouth daily.      Marland Kitchen glycopyrrolate (ROBINUL) 2 MG tablet Take 1 tablet (2 mg total) by mouth daily. 90 tablet 3  . lansoprazole (PREVACID) 30 MG capsule Take 1 capsule (30 mg total) by mouth daily. 90 capsule 3  . lovastatin (MEVACOR) 20 MG tablet Take 1 tablet (20 mg total) by mouth daily. 90 tablet 3  . NON FORMULARY 4 mg as directed. Chlorpheniramine maleate    . azithromycin (ZITHROMAX) 250 MG tablet As directed 6 tablet 0  . promethazine-codeine (PHENERGAN WITH CODEINE) 6.25-10 MG/5ML syrup Take 5 mLs by mouth every 4 (four) hours as needed. 300 mL 0   No facility-administered medications prior to visit.    ROS Review of Systems  Constitutional: Negative for appetite change, fatigue and unexpected weight change.  HENT: Negative for congestion, nosebleeds, sneezing, sore throat and trouble swallowing.   Eyes: Negative for itching and visual disturbance.  Respiratory: Negative for cough.   Cardiovascular: Negative for chest pain, palpitations and leg swelling.  Gastrointestinal: Negative for nausea, diarrhea, blood in stool and abdominal distention.  Genitourinary: Negative for frequency and hematuria.  Musculoskeletal: Negative for back pain, joint swelling, gait problem and neck pain.  Skin: Negative for rash.  Neurological: Negative for dizziness, tremors, speech difficulty and weakness.  Psychiatric/Behavioral: Negative for suicidal ideas, sleep disturbance, dysphoric mood and agitation. The patient is not nervous/anxious.     Objective:  BP 114/74 mmHg  Pulse 72  Ht 5\' 9"  (1.753 m)  Wt 159 lb (72.122 kg)  BMI 23.47 kg/m2  SpO2 95%  BP Readings from Last 3 Encounters:  07/27/15  114/74  03/29/15 112/80  07/19/14 128/78    Wt Readings from Last 3 Encounters:  07/27/15 159 lb (72.122 kg)  03/29/15 159 lb (72.122 kg)  07/19/14 158 lb 8 oz (71.895 kg)    Physical Exam  Constitutional: He is oriented to person, place, and time. He appears well-developed. No distress.  NAD  HENT:  Mouth/Throat: Oropharynx is clear and moist.  Eyes: Conjunctivae are normal. Pupils are equal, round, and reactive to light.  Neck: Normal range of motion. No JVD present. No thyromegaly present.  Cardiovascular: Normal rate, regular rhythm, normal heart sounds and intact distal pulses.  Exam reveals no gallop and no friction rub.   No murmur heard. Pulmonary/Chest: Effort normal and breath sounds normal. No respiratory distress. He has no wheezes. He has no rales. He exhibits no tenderness.  Abdominal: Soft. Bowel sounds are normal. He exhibits no distension and no mass. There is no tenderness. There is no rebound and no guarding.  Genitourinary: Guaiac negative stool.  Musculoskeletal: Normal range of motion. He exhibits no edema or tenderness.  Lymphadenopathy:    He has no cervical adenopathy.  Neurological: He is alert and oriented to person, place, and time. He has normal reflexes. No cranial nerve deficit. He exhibits normal muscle tone. He displays a negative Romberg sign. Coordination and gait normal.  Skin: Skin is warm and dry. No rash noted.  Psychiatric: He has a normal mood and affect. His behavior is normal. Judgment and thought content normal.  Rectal per Dr Windle Guard temple mole   Lab  Results  Component Value Date   WBC 5.2 07/12/2014   HGB 14.4 07/12/2014   HCT 43.0 07/12/2014   PLT 161.0 07/12/2014   GLUCOSE 92 07/12/2014   CHOL 144 07/12/2014   TRIG 75.0 07/12/2014   HDL 48.40 07/12/2014   LDLCALC 81 07/12/2014   ALT 10 07/12/2014   AST 14 07/12/2014   NA 141 07/12/2014   K 4.1 07/12/2014   CL 105 07/12/2014   CREATININE 0.91 07/12/2014   BUN 14  07/12/2014   CO2 32 07/12/2014   TSH 1.09 07/12/2014   PSA 0.01* 07/12/2014    No results found.  Assessment & Plan:   There are no diagnoses linked to this encounter. I have discontinued Mr. Dunlevy's promethazine-codeine and azithromycin. I am also having him maintain his NON FORMULARY, Cholecalciferol, glycopyrrolate, lansoprazole, and lovastatin.  No orders of the defined types were placed in this encounter.     Follow-up: No Follow-up on file.  Walker Kehr, MD

## 2015-07-27 NOTE — Assessment & Plan Note (Signed)
Here for medicare wellness/physical  Diet: heart healthy  Physical activity: sedentary  Depression/mood screen: negative  Hearing: intact to whispered voice  Visual acuity: grossly normal, performs annual eye exam  ADLs: capable  Fall risk: none  Home safety: good  Cognitive evaluation: intact to orientation, naming, recall and repetition  EOL planning: adv directives, full code/ I agree  I have personally reviewed and have noted  1. The patient's medical, surgical and social history  2. Their use of alcohol, tobacco or illicit drugs  3. Their current medications and supplements  4. The patient's functional ability including ADL's, fall risks, home safety risks and hearing or visual impairment.  5. Diet and physical activities  6. Evidence for depression or mood disorders 7. The roster of all physicians providing medical care to patient - is listed in the Snapshot section of the chart and reviewed today.    Today patient counseled on age appropriate routine health concerns for screening and prevention, each reviewed and up to date or declined. Immunizations reviewed and up to date or declined. Labs ordered and reviewed. Risk factors for depression reviewed and negative. Hearing function and visual acuity are intact. ADLs screened and addressed as needed. Functional ability and level of safety reviewed and appropriate. Education, counseling and referrals performed based on assessed risks today. Patient provided with a copy of personalized plan for preventive services.  Cologuard (-)

## 2015-07-28 LAB — HEPATITIS C ANTIBODY: HCV Ab: NEGATIVE

## 2015-08-01 DIAGNOSIS — L570 Actinic keratosis: Secondary | ICD-10-CM | POA: Diagnosis not present

## 2015-08-01 DIAGNOSIS — L820 Inflamed seborrheic keratosis: Secondary | ICD-10-CM | POA: Diagnosis not present

## 2015-08-01 DIAGNOSIS — L812 Freckles: Secondary | ICD-10-CM | POA: Diagnosis not present

## 2015-08-01 DIAGNOSIS — D1801 Hemangioma of skin and subcutaneous tissue: Secondary | ICD-10-CM | POA: Diagnosis not present

## 2015-08-01 DIAGNOSIS — Z85828 Personal history of other malignant neoplasm of skin: Secondary | ICD-10-CM | POA: Diagnosis not present

## 2015-08-01 DIAGNOSIS — L821 Other seborrheic keratosis: Secondary | ICD-10-CM | POA: Diagnosis not present

## 2015-08-01 DIAGNOSIS — D225 Melanocytic nevi of trunk: Secondary | ICD-10-CM | POA: Diagnosis not present

## 2015-09-04 ENCOUNTER — Other Ambulatory Visit: Payer: Self-pay | Admitting: Internal Medicine

## 2015-09-07 ENCOUNTER — Encounter: Payer: Self-pay | Admitting: Internal Medicine

## 2015-09-08 MED ORDER — LANSOPRAZOLE 30 MG PO CPDR: 30.0000 mg | DELAYED_RELEASE_CAPSULE | Freq: Every day | ORAL | 3 refills | Status: DC

## 2015-09-13 ENCOUNTER — Telehealth: Payer: Self-pay | Admitting: *Deleted

## 2015-09-13 MED ORDER — LANSOPRAZOLE 30 MG PO CPDR: 30.0000 mg | DELAYED_RELEASE_CAPSULE | Freq: Every day | ORAL | 0 refills | Status: DC

## 2015-09-13 NOTE — Telephone Encounter (Signed)
Patient states [facility] mail order still has not received Lansoprazole. Refill re sent. See meds. Pt informed via MyChart.

## 2015-10-25 DIAGNOSIS — N5201 Erectile dysfunction due to arterial insufficiency: Secondary | ICD-10-CM | POA: Diagnosis not present

## 2015-10-25 DIAGNOSIS — Z8546 Personal history of malignant neoplasm of prostate: Secondary | ICD-10-CM | POA: Diagnosis not present

## 2015-11-16 ENCOUNTER — Ambulatory Visit (INDEPENDENT_AMBULATORY_CARE_PROVIDER_SITE_OTHER): Payer: Medicare Other

## 2015-11-16 DIAGNOSIS — Z23 Encounter for immunization: Secondary | ICD-10-CM | POA: Diagnosis not present

## 2016-01-05 ENCOUNTER — Encounter: Payer: Self-pay | Admitting: Internal Medicine

## 2016-01-05 ENCOUNTER — Other Ambulatory Visit: Payer: Self-pay | Admitting: Internal Medicine

## 2016-01-05 MED ORDER — LANSOPRAZOLE 30 MG PO CPDR
30.0000 mg | DELAYED_RELEASE_CAPSULE | Freq: Every day | ORAL | 3 refills | Status: DC
Start: 2016-01-05 — End: 2016-07-18

## 2016-01-18 DIAGNOSIS — H524 Presbyopia: Secondary | ICD-10-CM | POA: Diagnosis not present

## 2016-01-18 DIAGNOSIS — H2513 Age-related nuclear cataract, bilateral: Secondary | ICD-10-CM | POA: Diagnosis not present

## 2016-01-18 DIAGNOSIS — H5201 Hypermetropia, right eye: Secondary | ICD-10-CM | POA: Diagnosis not present

## 2016-01-18 DIAGNOSIS — H52201 Unspecified astigmatism, right eye: Secondary | ICD-10-CM | POA: Diagnosis not present

## 2016-07-18 ENCOUNTER — Encounter: Payer: Self-pay | Admitting: Internal Medicine

## 2016-07-18 ENCOUNTER — Ambulatory Visit (INDEPENDENT_AMBULATORY_CARE_PROVIDER_SITE_OTHER): Payer: Medicare Other | Admitting: Internal Medicine

## 2016-07-18 ENCOUNTER — Other Ambulatory Visit (INDEPENDENT_AMBULATORY_CARE_PROVIDER_SITE_OTHER): Payer: Medicare Other

## 2016-07-18 VITALS — BP 132/80 | HR 72 | Temp 97.8°F | Ht 69.0 in | Wt 161.0 lb

## 2016-07-18 DIAGNOSIS — Z0000 Encounter for general adult medical examination without abnormal findings: Secondary | ICD-10-CM

## 2016-07-18 DIAGNOSIS — Z8546 Personal history of malignant neoplasm of prostate: Secondary | ICD-10-CM

## 2016-07-18 DIAGNOSIS — E785 Hyperlipidemia, unspecified: Secondary | ICD-10-CM

## 2016-07-18 LAB — BASIC METABOLIC PANEL
BUN: 15 mg/dL (ref 6–23)
CALCIUM: 9.6 mg/dL (ref 8.4–10.5)
CO2: 31 meq/L (ref 19–32)
Chloride: 105 mEq/L (ref 96–112)
Creatinine, Ser: 0.91 mg/dL (ref 0.40–1.50)
GFR: 88.16 mL/min (ref >60.00)
Glucose, Bld: 90 mg/dL (ref 70–99)
Potassium: 4.1 mEq/L (ref 3.5–5.1)
Sodium: 143 mEq/L (ref 135–145)

## 2016-07-18 LAB — URINALYSIS
BILIRUBIN URINE: NEGATIVE
HGB URINE DIPSTICK: NEGATIVE
KETONES UR: NEGATIVE
LEUKOCYTES UA: NEGATIVE
Nitrite: NEGATIVE
Total Protein, Urine: NEGATIVE
URINE GLUCOSE: NEGATIVE
UROBILINOGEN UA: 0.2 (ref 0.0–1.0)
pH: 6.5 (ref 5.0–8.0)

## 2016-07-18 LAB — CBC WITH DIFFERENTIAL/PLATELET
BASOS ABS: 0 10*3/uL (ref 0.0–0.1)
Basophils Relative: 0.9 % (ref 0.0–3.0)
Eosinophils Absolute: 0.2 10*3/uL (ref 0.0–0.7)
Eosinophils Relative: 3.1 % (ref 0.0–5.0)
HEMATOCRIT: 44.6 % (ref 39.0–52.0)
HEMOGLOBIN: 14.9 g/dL (ref 13.0–17.0)
LYMPHS PCT: 29.6 % (ref 12.0–46.0)
Lymphs Abs: 1.5 10*3/uL (ref 0.7–4.0)
MCHC: 33.5 g/dL (ref 30.0–36.0)
MCV: 88.2 fl (ref 78.0–100.0)
MONOS PCT: 7.2 % (ref 3.0–12.0)
Monocytes Absolute: 0.4 10*3/uL (ref 0.1–1.0)
NEUTROS ABS: 3 10*3/uL (ref 1.4–7.7)
Neutrophils Relative %: 59.2 % (ref 43.0–77.0)
Platelets: 165 10*3/uL (ref 150.0–400.0)
RBC: 5.05 Mil/uL (ref 4.22–5.81)
RDW: 13.5 % (ref 11.5–15.5)
WBC: 5.1 10*3/uL (ref 4.0–10.5)

## 2016-07-18 LAB — HEPATIC FUNCTION PANEL
ALT: 12 U/L (ref 0–53)
AST: 16 U/L (ref 0–37)
Albumin: 4.6 g/dL (ref 3.5–5.2)
Alkaline Phosphatase: 51 U/L (ref 39–117)
BILIRUBIN DIRECT: 0.2 mg/dL (ref 0.0–0.3)
BILIRUBIN TOTAL: 1 mg/dL (ref 0.2–1.2)
Total Protein: 6.6 g/dL (ref 6.0–8.3)

## 2016-07-18 LAB — LIPID PANEL
CHOL/HDL RATIO: 3
Cholesterol: 159 mg/dL (ref 0–200)
HDL: 50.4 mg/dL (ref >39.00)
LDL CALC: 91 mg/dL (ref 0–99)
NONHDL: 108.77
TRIGLYCERIDES: 91 mg/dL (ref 0.0–149.0)
VLDL: 18.2 mg/dL (ref 0.0–40.0)

## 2016-07-18 LAB — TSH: TSH: 1.18 u[IU]/mL (ref 0.35–4.50)

## 2016-07-18 LAB — PSA: PSA: 0.01 ng/mL — ABNORMAL LOW (ref 0.10–4.00)

## 2016-07-18 MED ORDER — LANSOPRAZOLE 30 MG PO CPDR: 30.0000 mg | DELAYED_RELEASE_CAPSULE | Freq: Every day | ORAL | 3 refills | Status: DC

## 2016-07-18 MED ORDER — LOVASTATIN 20 MG PO TABS: 20.0000 mg | ORAL_TABLET | Freq: Every day | ORAL | 3 refills | Status: DC

## 2016-07-18 MED ORDER — GLYCOPYRROLATE 2 MG PO TABS: 2.0000 mg | ORAL_TABLET | Freq: Every day | ORAL | 3 refills | Status: DC

## 2016-07-18 NOTE — Patient Instructions (Signed)

## 2016-07-18 NOTE — Assessment & Plan Note (Signed)
PSA

## 2016-07-18 NOTE — Assessment & Plan Note (Addendum)
Here for medicare wellness/physical  Diet: heart healthy  Physical activity: not sedentary  Depression/mood screen: negative  Hearing: intact to whispered voice w/hearing aids  Visual acuity: grossly normal w/glasses, performs annual eye exam  ADLs: capable  Fall risk: low to none  Home safety: good  Cognitive evaluation: intact to orientation, naming, recall and repetition  EOL planning: adv directives, full code/ I agree  I have personally reviewed and have noted  1. The patient's medical, surgical and social history  2. Their use of alcohol, tobacco or illicit drugs  3. Their current medications and supplements  4. The patient's functional ability including ADL's, fall risks, home safety risks and hearing or visual impairment.  5. Diet and physical activities  6. Evidence for depression or mood disorders 7. The roster of all physicians providing medical care to patient - is listed in the Snapshot section of the chart and reviewed today.    Today patient counseled on age appropriate routine health concerns for screening and prevention, each reviewed and up to date or declined. Immunizations reviewed and up to date or declined. Labs ordered and reviewed. Risk factors for depression reviewed and negative. Hearing function and visual acuity are intact. ADLs screened and addressed as needed. Functional ability and level of safety reviewed and appropriate. Education, counseling and referrals performed based on assessed risks today. Patient provided with a copy of personalized plan for preventive services.   Cologuard next year

## 2016-07-18 NOTE — Progress Notes (Signed)
Subjective:  Patient ID: Brendan [name], male    DOB: 1950/03/22  Age: 66 y.o. MRN: [id number]  CC: No chief complaint on file.   HPI Brendan [name] presents for a well exam   Outpatient Medications Prior to Visit  Medication Sig Dispense Refill  . Cholecalciferol 1000 UNITS tablet Take 1,000 Units by mouth daily.      Marland Kitchen glycopyrrolate (ROBINUL) 2 MG tablet TAKE 1 TABLET DAILY 90 tablet 3  . lovastatin (MEVACOR) 20 MG tablet Take 1 tablet (20 mg total) by mouth daily. 90 tablet 3  . NON FORMULARY 4 mg as directed. Chlorpheniramine maleate    . lansoprazole (PREVACID) 30 MG capsule Take 1 capsule (30 mg total) by mouth daily. 90 capsule 3   No facility-administered medications prior to visit.     ROS Review of Systems  Constitutional: Negative for appetite change, fatigue and unexpected weight change.  HENT: Negative for congestion, nosebleeds, sneezing, sore throat and trouble swallowing.   Eyes: Negative for itching and visual disturbance.  Respiratory: Negative for cough.   Cardiovascular: Negative for chest pain, palpitations and leg swelling.  Gastrointestinal: Negative for abdominal distention, blood in stool, diarrhea and nausea.  Genitourinary: Negative for frequency and hematuria.  Musculoskeletal: Negative for back pain, gait problem, joint swelling and neck pain.  Skin: Negative for rash.  Neurological: Negative for dizziness, tremors, speech difficulty and weakness.  Psychiatric/Behavioral: Negative for agitation, dysphoric mood, sleep disturbance and suicidal ideas. The patient is not nervous/anxious.     Objective:  BP 132/80 (BP Location: Left Arm, Patient Position: Sitting, Cuff Size: Normal)   Pulse 72   Temp 97.8 F (36.6 C) (Oral)   Ht 5\' 9"  (1.753 m)   Wt 161 lb (73 kg)   SpO2 98%   BMI 23.78 kg/m   BP Readings from Last 3 Encounters:  07/18/16 132/80  07/27/15 114/74  03/29/15 112/80    Wt Readings from Last 3 Encounters:  07/18/16 161 lb  (73 kg)  07/27/15 159 lb (72.1 kg)  03/29/15 159 lb (72.1 kg)    Physical Exam  Constitutional: He is oriented to person, place, and time. He appears well-developed. No distress.  NAD  HENT:  Mouth/Throat: Oropharynx is clear and moist.  Eyes: Conjunctivae are normal. Pupils are equal, round, and reactive to light.  Neck: Normal range of motion. No JVD present. No thyromegaly present.  Cardiovascular: Normal rate, regular rhythm, normal heart sounds and intact distal pulses.  Exam reveals no gallop and no friction rub.   No murmur heard. Pulmonary/Chest: Effort normal and breath sounds normal. No respiratory distress. He has no wheezes. He has no rales. He exhibits no tenderness.  Abdominal: Soft. Bowel sounds are normal. He exhibits no distension and no mass. There is no tenderness. There is no rebound and no guarding.  Genitourinary: Rectum normal. Rectal exam shows guaiac negative stool.  Musculoskeletal: Normal range of motion. He exhibits no edema or tenderness.  Lymphadenopathy:    He has no cervical adenopathy.  Neurological: He is alert and oriented to person, place, and time. He has normal reflexes. No cranial nerve deficit. He exhibits normal muscle tone. He displays a negative Romberg sign. Coordination and gait normal.  Skin: Skin is warm and dry. No rash noted.  Psychiatric: He has a normal mood and affect. His behavior is normal. Judgment and thought content normal.  spider veins on legs  Lab Results  Component Value Date   WBC 5.0 07/27/2015  HGB 15.2 07/27/2015   HCT 44.8 07/27/2015   PLT 181.0 07/27/2015   GLUCOSE 89 07/27/2015   CHOL 153 07/27/2015   TRIG 81.0 07/27/2015   HDL 54.80 07/27/2015   LDLCALC 82 07/27/2015   ALT 12 07/27/2015   AST 17 07/27/2015   NA 140 07/27/2015   K 4.7 07/27/2015   CL 106 07/27/2015   CREATININE 0.92 07/27/2015   BUN 11 07/27/2015   CO2 34 (H) 07/27/2015   TSH 1.25 07/27/2015   PSA 0.01 (L) 07/27/2015    No results  found.  Assessment & Plan:   There are no diagnoses linked to this encounter. I am having Brendan [name] maintain his NON FORMULARY, Cholecalciferol, lovastatin, glycopyrrolate, and lansoprazole.  No orders of the defined types were placed in this encounter.    Follow-up: No Follow-up on file.  Walker Kehr, MD

## 2016-07-18 NOTE — Assessment & Plan Note (Signed)
Lovastatin 

## 2016-07-29 ENCOUNTER — Encounter: Payer: Medicare Other | Admitting: Internal Medicine

## 2016-07-30 DIAGNOSIS — D225 Melanocytic nevi of trunk: Secondary | ICD-10-CM | POA: Diagnosis not present

## 2016-07-30 DIAGNOSIS — D1801 Hemangioma of skin and subcutaneous tissue: Secondary | ICD-10-CM | POA: Diagnosis not present

## 2016-07-30 DIAGNOSIS — D485 Neoplasm of uncertain behavior of skin: Secondary | ICD-10-CM | POA: Diagnosis not present

## 2016-07-30 DIAGNOSIS — L738 Other specified follicular disorders: Secondary | ICD-10-CM | POA: Diagnosis not present

## 2016-07-30 DIAGNOSIS — Z85828 Personal history of other malignant neoplasm of skin: Secondary | ICD-10-CM | POA: Diagnosis not present

## 2016-07-30 DIAGNOSIS — L821 Other seborrheic keratosis: Secondary | ICD-10-CM | POA: Diagnosis not present

## 2016-07-30 DIAGNOSIS — L570 Actinic keratosis: Secondary | ICD-10-CM | POA: Diagnosis not present

## 2016-07-30 DIAGNOSIS — C44219 Basal cell carcinoma of skin of left ear and external auricular canal: Secondary | ICD-10-CM | POA: Diagnosis not present

## 2016-07-30 DIAGNOSIS — L820 Inflamed seborrheic keratosis: Secondary | ICD-10-CM | POA: Diagnosis not present

## 2016-07-30 DIAGNOSIS — L812 Freckles: Secondary | ICD-10-CM | POA: Diagnosis not present

## 2016-09-04 DIAGNOSIS — Z85828 Personal history of other malignant neoplasm of skin: Secondary | ICD-10-CM | POA: Diagnosis not present

## 2016-09-04 DIAGNOSIS — C44219 Basal cell carcinoma of skin of left ear and external auricular canal: Secondary | ICD-10-CM | POA: Diagnosis not present

## 2016-09-18 DIAGNOSIS — L570 Actinic keratosis: Secondary | ICD-10-CM | POA: Diagnosis not present

## 2016-09-18 DIAGNOSIS — Z4802 Encounter for removal of sutures: Secondary | ICD-10-CM | POA: Diagnosis not present

## 2016-11-19 ENCOUNTER — Ambulatory Visit (INDEPENDENT_AMBULATORY_CARE_PROVIDER_SITE_OTHER): Payer: Medicare Other | Admitting: General Practice

## 2016-11-19 DIAGNOSIS — Z23 Encounter for immunization: Secondary | ICD-10-CM

## 2017-01-20 DIAGNOSIS — H2513 Age-related nuclear cataract, bilateral: Secondary | ICD-10-CM | POA: Diagnosis not present

## 2017-03-21 DIAGNOSIS — L821 Other seborrheic keratosis: Secondary | ICD-10-CM | POA: Diagnosis not present

## 2017-03-21 DIAGNOSIS — C44319 Basal cell carcinoma of skin of other parts of face: Secondary | ICD-10-CM | POA: Diagnosis not present

## 2017-03-21 DIAGNOSIS — D485 Neoplasm of uncertain behavior of skin: Secondary | ICD-10-CM | POA: Diagnosis not present

## 2017-03-21 DIAGNOSIS — Z85828 Personal history of other malignant neoplasm of skin: Secondary | ICD-10-CM | POA: Diagnosis not present

## 2017-03-21 DIAGNOSIS — L570 Actinic keratosis: Secondary | ICD-10-CM | POA: Diagnosis not present

## 2017-04-07 DIAGNOSIS — Z85828 Personal history of other malignant neoplasm of skin: Secondary | ICD-10-CM | POA: Diagnosis not present

## 2017-04-07 DIAGNOSIS — C44319 Basal cell carcinoma of skin of other parts of face: Secondary | ICD-10-CM | POA: Diagnosis not present

## 2017-04-17 ENCOUNTER — Telehealth: Payer: Self-pay

## 2017-04-17 NOTE — Telephone Encounter (Signed)
I called and left message for patient today. Last Cologuard screen was done 01/02/14 and he is due for another screening. Left message with this information and for him to return call to office giving [place] permission to re-order kit and have it shipped to his home. CRM created incase he calls back to provide verbal okay to order.

## 2017-04-18 ENCOUNTER — Telehealth: Payer: Self-pay

## 2017-04-18 DIAGNOSIS — Z1211 Encounter for screening for malignant neoplasm of colon: Secondary | ICD-10-CM

## 2017-04-18 NOTE — Telephone Encounter (Signed)
Copied from [place] [id number]. Topic: Quick Communication - Office Called Patient >> Apr 17, 2017 10:57 AM Marcina Millard, CMA wrote: Reason for CRM: I called patient today and left message . He is due again for his Cologuard screening  (as last one was done 01/02/14 and they are done every 3 years) again. Left message with this information and for him to return call to office giving [place] verbal permission to re-order kit and have it shipped to his home. If he is okay with proceeding please let me know and I will place order. He has an apt on 07/22/17 for his yearly visit with Dr. Alain Marion.  >> Apr 17, 2017 11:09 AM Ahmed Prima L wrote: Pt said to please order the kit for him. Thanks

## 2017-05-05 DIAGNOSIS — Z1211 Encounter for screening for malignant neoplasm of colon: Secondary | ICD-10-CM | POA: Diagnosis not present

## 2017-05-15 LAB — COLOGUARD: COLOGUARD: NEGATIVE

## 2017-06-05 ENCOUNTER — Telehealth: Payer: Self-pay | Admitting: Internal Medicine

## 2017-06-05 NOTE — Telephone Encounter (Signed)
Spoke with Brendan [name] regarding AWV. Patient stated that he is not interested in scheduling a wellness visit at this time. SF

## 2017-06-12 ENCOUNTER — Encounter: Payer: Self-pay | Admitting: Internal Medicine

## 2017-07-15 ENCOUNTER — Telehealth: Payer: Self-pay | Admitting: Internal Medicine

## 2017-07-15 ENCOUNTER — Ambulatory Visit (INDEPENDENT_AMBULATORY_CARE_PROVIDER_SITE_OTHER): Payer: Medicare Other | Admitting: Internal Medicine

## 2017-07-15 ENCOUNTER — Encounter: Payer: Self-pay | Admitting: Internal Medicine

## 2017-07-15 DIAGNOSIS — J309 Allergic rhinitis, unspecified: Secondary | ICD-10-CM

## 2017-07-15 DIAGNOSIS — H9110 Presbycusis, unspecified ear: Secondary | ICD-10-CM | POA: Diagnosis not present

## 2017-07-15 DIAGNOSIS — H6092 Unspecified otitis externa, left ear: Secondary | ICD-10-CM

## 2017-07-15 MED ORDER — NEOMYCIN-POLYMYXIN-HC 1 % OT SOLN: 3.0000 [drp] | Freq: Three times a day (TID) | OTIC | 0 refills | Status: DC

## 2017-07-15 NOTE — Assessment & Plan Note (Signed)
Mild to mod, for antibx course,  to f/u any worsening symptoms or concerns 

## 2017-07-15 NOTE — Assessment & Plan Note (Signed)
stable overall by history and exam, and pt to continue medical treatment as before,  to f/u any worsening symptoms or concerns 

## 2017-07-15 NOTE — Assessment & Plan Note (Signed)
stable overall by history and exam, and pt to continue medical treatment as before such as claritin 10 qd prn otc,  to f/u any worsening symptoms or concerns

## 2017-07-15 NOTE — Progress Notes (Signed)
Subjective:    Patient ID: Brendan [name], male    DOB: 04-29-1950, 67 y.o.   MRN: [id number]  HPI  Here with 3 daysacute onset left ear pain and itchiness with aching and feeling warm, though has no high fever, drainage, hearing loss, vertigo, HA, sinus symptoms, ST, or cough.  Pt denies chest pain, increased sob or doe, wheezing, orthopnea, PND, increased LE swelling, palpitations, dizziness or syncope.Pt denies new neurological symptoms such as new headache, or facial or extremity weakness or numbness   Pt denies polydipsia, polyuria,  No other interval hx or new complaint  Past Medical History:  Diagnosis Date  . Allergy    rhinitis  . Cancer [facility])    prostate.  skin basal cell  . ED (erectile dysfunction)   . GERD (gastroesophageal reflux disease)   . Hyperlipidemia   . IBS (irritable bowel syndrome)   . Ruptured cervical disc 2008   Past Surgical History:  Procedure Laterality Date  . ACHILLES TENDON SURGERY  1980   prosthesis  . AMPUTATION FINGER / THUMB Right 12/2004   tip of thumb for strep infection  . PROSTATECTOMY  2007    reports that he has never smoked. He has never used smokeless tobacco. He reports that he does not drink alcohol or use drugs. family history includes Heart disease in his mother; Heart disease (age of onset: 44) in his father; Hyperlipidemia in his unknown relative. Allergies  Allergen Reactions  . Hydrocodone-Acetaminophen Rash    Can take codeine ok   Current Outpatient Medications on File Prior to Visit  Medication Sig Dispense Refill  . Cholecalciferol 1000 UNITS tablet Take 1,000 Units by mouth daily.      Marland Kitchen glycopyrrolate (ROBINUL) 2 MG tablet Take 1 tablet (2 mg total) by mouth daily. 90 tablet 3  . lovastatin (MEVACOR) 20 MG tablet Take 1 tablet (20 mg total) by mouth daily. 90 tablet 3  . NON FORMULARY 4 mg as directed. Chlorpheniramine maleate    . lansoprazole (PREVACID) 30 MG capsule Take 1 capsule (30 mg total) by mouth daily. 90  capsule 3   No current facility-administered medications on file prior to visit.    Review of Systems  Constitutional: Negative for other unusual diaphoresis or sweats HENT: Negative for ear discharge or swelling Eyes: Negative for other worsening visual disturbances Respiratory: Negative for stridor or other swelling  Gastrointestinal: Negative for worsening distension or other blood Genitourinary: Negative for retention or other urinary change Musculoskeletal: Negative for other MSK pain or swelling Skin: Negative for color change or other new lesions Neurological: Negative for worsening tremors and other numbness  Psychiatric/Behavioral: Negative for worsening agitation or other fatigue All other system neg per pt    Objective:   Physical Exam BP 126/82   Pulse 72   Temp 97.8 F (36.6 C) (Oral)   Ht 5\' 9"  (1.753 m)   Wt 166 lb (75.3 kg)   SpO2 97%   BMI 24.51 kg/m  VS noted, mild ill Constitutional: Pt appears in NAD HENT: Head: NCAT.  Right Ear: External ear normal.  Left Ear: External ear normal. left canal with 1+ red, tender, swelling without d/c or blood Eyes: . Pupils are equal, round, and reactive to light. Conjunctivae and EOM are normal Nose: without d/c or deformity Neck: Neck supple. Gross normal ROM Cardiovascular: Normal rate and regular rhythm.   Pulmonary/Chest: Effort normal and breath sounds without rales or wheezing.  Abd:  Soft, NT, ND, + BS,  no organomegaly Neurological: Pt is alert. At baseline orientation, motor grossly intact Skin: Skin is warm. No rashes, other new lesions, no LE edema Psychiatric: Pt behavior is normal without agitation  No other exam findings Lab Results  Component Value Date   WBC 5.1 07/18/2016   HGB 14.9 07/18/2016   HCT 44.6 07/18/2016   PLT 165.0 07/18/2016   GLUCOSE 90 07/18/2016   CHOL 159 07/18/2016   TRIG 91.0 07/18/2016   HDL 50.40 07/18/2016   LDLCALC 91 07/18/2016   ALT 12 07/18/2016   AST 16 07/18/2016     NA 143 07/18/2016   K 4.1 07/18/2016   CL 105 07/18/2016   CREATININE 0.91 07/18/2016   BUN 15 07/18/2016   CO2 31 07/18/2016   TSH 1.18 07/18/2016   PSA 0.01 (L) 07/18/2016       Assessment & Plan:

## 2017-07-15 NOTE — Telephone Encounter (Signed)
Copied from [place] [id number]. Topic: Quick Communication - Rx Refill/Question >> Jul 15, 2017 11:53 AM Oliver Pila B wrote: Medication: NEOMYCIN-POLYMYXIN-HYDROCORTISONE (CORTISPORIN) 1 % SOLN OTIC solution [[id number]]   [id number] does not have the solution but they do have the suspension and is wanting to know if it is okay to substitute, call pharmacy to advise

## 2017-07-15 NOTE — Patient Instructions (Signed)
Please take all new medication as prescribed - the ear drops  Please continue all other medications as before, and refills have been done if requested.  Please have the pharmacy call with any other refills you may need.  Please keep your appointments with your specialists as you may have planned

## 2017-07-16 NOTE — Telephone Encounter (Signed)
please advise.

## 2017-07-16 NOTE — Telephone Encounter (Signed)
LOV 07-15-17 with Dr. Jenny Reichmann / Dr. Jenny Reichmann wrote RX / Please see pharmacy request for change. /

## 2017-07-17 MED ORDER — NEOMYCIN-COLIST-HC-THONZONIUM 3.3-3-10-0.5 MG/ML OT SUSP: 3.0000 [drp] | Freq: Three times a day (TID) | OTIC | 0 refills | Status: AC

## 2017-07-17 NOTE — Telephone Encounter (Signed)
Ok, rx is changed

## 2017-07-22 ENCOUNTER — Encounter: Payer: Self-pay | Admitting: Internal Medicine

## 2017-07-22 ENCOUNTER — Other Ambulatory Visit (INDEPENDENT_AMBULATORY_CARE_PROVIDER_SITE_OTHER): Payer: Medicare Other

## 2017-07-22 ENCOUNTER — Ambulatory Visit (INDEPENDENT_AMBULATORY_CARE_PROVIDER_SITE_OTHER): Payer: Medicare Other | Admitting: Internal Medicine

## 2017-07-22 VITALS — BP 126/74 | HR 64 | Temp 97.6°F | Ht 69.0 in | Wt 166.0 lb

## 2017-07-22 DIAGNOSIS — E785 Hyperlipidemia, unspecified: Secondary | ICD-10-CM

## 2017-07-22 DIAGNOSIS — J309 Allergic rhinitis, unspecified: Secondary | ICD-10-CM

## 2017-07-22 DIAGNOSIS — Z0000 Encounter for general adult medical examination without abnormal findings: Secondary | ICD-10-CM

## 2017-07-22 DIAGNOSIS — Z8546 Personal history of malignant neoplasm of prostate: Secondary | ICD-10-CM

## 2017-07-22 DIAGNOSIS — K219 Gastro-esophageal reflux disease without esophagitis: Secondary | ICD-10-CM | POA: Diagnosis not present

## 2017-07-22 LAB — URINALYSIS
Bilirubin Urine: NEGATIVE
Hgb urine dipstick: NEGATIVE
KETONES UR: NEGATIVE
LEUKOCYTES UA: NEGATIVE
Nitrite: NEGATIVE
PH: 7.5 (ref 5.0–8.0)
SPECIFIC GRAVITY, URINE: 1.01 (ref 1.000–1.030)
Total Protein, Urine: NEGATIVE
URINE GLUCOSE: NEGATIVE
Urobilinogen, UA: 0.2 (ref 0.0–1.0)

## 2017-07-22 LAB — BASIC METABOLIC PANEL
BUN: 10 mg/dL (ref 6–23)
CALCIUM: 9.4 mg/dL (ref 8.4–10.5)
CO2: 32 meq/L (ref 19–32)
Chloride: 107 mEq/L (ref 96–112)
Creatinine, Ser: 0.99 mg/dL (ref 0.40–1.50)
GFR: 79.75 mL/min (ref >60.00)
Glucose, Bld: 96 mg/dL (ref 70–99)
POTASSIUM: 4.9 meq/L (ref 3.5–5.1)
SODIUM: 144 meq/L (ref 135–145)

## 2017-07-22 LAB — HEPATIC FUNCTION PANEL
ALK PHOS: 48 U/L (ref 39–117)
ALT: 11 U/L (ref 0–53)
AST: 13 U/L (ref 0–37)
Albumin: 4.6 g/dL (ref 3.5–5.2)
BILIRUBIN DIRECT: 0.2 mg/dL (ref 0.0–0.3)
Total Bilirubin: 0.7 mg/dL (ref 0.2–1.2)
Total Protein: 6.5 g/dL (ref 6.0–8.3)

## 2017-07-22 LAB — CBC WITH DIFFERENTIAL/PLATELET
BASOS PCT: 1.2 % (ref 0.0–3.0)
Basophils Absolute: 0.1 10*3/uL (ref 0.0–0.1)
Eosinophils Absolute: 0.2 10*3/uL (ref 0.0–0.7)
Eosinophils Relative: 4.3 % (ref 0.0–5.0)
HEMATOCRIT: 43.1 % (ref 39.0–52.0)
Hemoglobin: 14.8 g/dL (ref 13.0–17.0)
LYMPHS PCT: 28.2 % (ref 12.0–46.0)
Lymphs Abs: 1.3 10*3/uL (ref 0.7–4.0)
MCHC: 34.3 g/dL (ref 30.0–36.0)
MCV: 88.2 fl (ref 78.0–100.0)
MONOS PCT: 8.1 % (ref 3.0–12.0)
Monocytes Absolute: 0.4 10*3/uL (ref 0.1–1.0)
NEUTROS ABS: 2.8 10*3/uL (ref 1.4–7.7)
Neutrophils Relative %: 58.2 % (ref 43.0–77.0)
PLATELETS: 146 10*3/uL — AB (ref 150.0–400.0)
RBC: 4.89 Mil/uL (ref 4.22–5.81)
RDW: 13 % (ref 11.5–15.5)
WBC: 4.8 10*3/uL (ref 4.0–10.5)

## 2017-07-22 LAB — LIPID PANEL
Cholesterol: 149 mg/dL (ref 0–200)
HDL: 55.2 mg/dL (ref >39.00)
LDL Cholesterol: 77 mg/dL (ref 0–99)
NonHDL: 93.56
TRIGLYCERIDES: 81 mg/dL (ref 0.0–149.0)
Total CHOL/HDL Ratio: 3
VLDL: 16.2 mg/dL (ref 0.0–40.0)

## 2017-07-22 LAB — TSH: TSH: 1.02 u[IU]/mL (ref 0.35–4.50)

## 2017-07-22 LAB — PSA: PSA: 0.01 ng/mL — ABNORMAL LOW (ref 0.10–4.00)

## 2017-07-22 MED ORDER — GLYCOPYRROLATE 2 MG PO TABS: 2.0000 mg | ORAL_TABLET | Freq: Every day | ORAL | 3 refills | Status: DC

## 2017-07-22 MED ORDER — LANSOPRAZOLE 30 MG PO CPDR: 30.0000 mg | DELAYED_RELEASE_CAPSULE | Freq: Every day | ORAL | 3 refills | Status: DC

## 2017-07-22 MED ORDER — LOVASTATIN 20 MG PO TABS: 20.0000 mg | ORAL_TABLET | Freq: Every day | ORAL | 3 refills | Status: DC

## 2017-07-22 NOTE — Assessment & Plan Note (Signed)
PSA

## 2017-07-22 NOTE — Assessment & Plan Note (Signed)
Lovastatin 

## 2017-07-22 NOTE — Assessment & Plan Note (Signed)
Cologuard (-) 2015    Here for medicare wellness/physical  Diet: heart healthy  Physical activity: not sedentary  Depression/mood screen: negative  Hearing: intact to whispered voice w/hearing aids  Visual acuity: grossly normal w/glasses, performs annual eye exam  ADLs: capable  Fall risk: low to none  Home safety: good  Cognitive evaluation: intact to orientation, naming, recall and repetition  EOL planning: adv directives, full code/ I agree  I have personally reviewed and have noted  1. The patient's medical, surgical and social history  2. Their use of alcohol, tobacco or illicit drugs  3. Their current medications and supplements  4. The patient's functional ability including ADL's, fall risks, home safety risks and hearing or visual impairment.  5. Diet and physical activities  6. Evidence for depression or mood disorders 7. The roster of all physicians providing medical care to patient - is listed in the Snapshot section of the chart and reviewed today.    Today patient counseled on age appropriate routine health concerns for screening and prevention, each reviewed and up to date or declined. Immunizations reviewed and up to date or declined. Labs ordered and reviewed. Risk factors for depression reviewed and negative. Hearing function and visual acuity are intact. ADLs screened and addressed as needed. Functional ability and level of safety reviewed and appropriate. Education, counseling and referrals performed based on assessed risks today. Patient provided with a copy of personalized plan for preventive services.

## 2017-07-22 NOTE — Assessment & Plan Note (Signed)
Prevacid  

## 2017-07-22 NOTE — Assessment & Plan Note (Signed)
Chlorpheniramine 4 mg qd

## 2017-07-22 NOTE — Progress Notes (Signed)
Subjective:  Patient ID: Brendan [name], male    DOB: 12/27/1950  Age: 67 y.o. MRN: [id number]  CC: No chief complaint on file.   HPI Brendan [name] presents for a well exam F/u IBS, HTN, dyslipidemia  Outpatient Medications Prior to Visit  Medication Sig Dispense Refill  . Cholecalciferol 1000 UNITS tablet Take 1,000 Units by mouth daily.      Marland Kitchen glycopyrrolate (ROBINUL) 2 MG tablet Take 1 tablet (2 mg total) by mouth daily. 90 tablet 3  . lovastatin (MEVACOR) 20 MG tablet Take 1 tablet (20 mg total) by mouth daily. 90 tablet 3  . neomycin-colistin-hydrocortisone-thonzonium (CORTISPORIN-TC) 3.04-06-08-0.5 MG/ML OTIC suspension Place 3 drops into the left ear 3 (three) times daily for 10 days. 10 mL 0  . NON FORMULARY 4 mg as directed. Chlorpheniramine maleate    . lansoprazole (PREVACID) 30 MG capsule Take 1 capsule (30 mg total) by mouth daily. 90 capsule 3   No facility-administered medications prior to visit.     ROS: Review of Systems  Constitutional: Negative for appetite change, fatigue and unexpected weight change.  HENT: Negative for congestion, nosebleeds, sneezing, sore throat and trouble swallowing.   Eyes: Negative for itching and visual disturbance.  Respiratory: Negative for cough.   Cardiovascular: Negative for chest pain, palpitations and leg swelling.  Gastrointestinal: Negative for abdominal distention, blood in stool, diarrhea and nausea.  Genitourinary: Negative for frequency and hematuria.  Musculoskeletal: Negative for back pain, gait problem, joint swelling and neck pain.  Skin: Negative for rash.  Neurological: Negative for dizziness, tremors, speech difficulty and weakness.  Psychiatric/Behavioral: Negative for agitation, dysphoric mood, sleep disturbance and suicidal ideas. The patient is not nervous/anxious.     Objective:  BP 126/74 (BP Location: Left Arm, Patient Position: Sitting, Cuff Size: Normal)   Pulse 64   Temp 97.6 F (36.4 C) (Oral)    Ht 5\' 9"  (1.753 m)   Wt 166 lb (75.3 kg)   SpO2 99%   BMI 24.51 kg/m   BP Readings from Last 3 Encounters:  07/22/17 126/74  07/15/17 126/82  07/18/16 132/80    Wt Readings from Last 3 Encounters:  07/22/17 166 lb (75.3 kg)  07/15/17 166 lb (75.3 kg)  07/18/16 161 lb (73 kg)    Physical Exam  Constitutional: He is oriented to person, place, and time. He appears well-developed. No distress.  NAD  HENT:  Mouth/Throat: Oropharynx is clear and moist.  Eyes: Pupils are equal, round, and reactive to light. Conjunctivae are normal.  Neck: Normal range of motion. No JVD present. No thyromegaly present.  Cardiovascular: Normal rate, regular rhythm, normal heart sounds and intact distal pulses. Exam reveals no gallop and no friction rub.  No murmur heard. Pulmonary/Chest: Effort normal and breath sounds normal. No respiratory distress. He has no wheezes. He has no rales. He exhibits no tenderness.  Abdominal: Soft. Bowel sounds are normal. He exhibits no distension and no mass. There is no tenderness. There is no rebound and no guarding.  Musculoskeletal: Normal range of motion. He exhibits no edema or tenderness.  Lymphadenopathy:    He has no cervical adenopathy.  Neurological: He is alert and oriented to person, place, and time. He has normal reflexes. No cranial nerve deficit. He exhibits normal muscle tone. He displays a negative Romberg sign. Coordination and gait normal.  Skin: Skin is warm and dry. No rash noted.  Psychiatric: He has a normal mood and affect. His behavior is normal. Judgment and thought  content normal.  declined rectal exam  Lab Results  Component Value Date   WBC 5.1 07/18/2016   HGB 14.9 07/18/2016   HCT 44.6 07/18/2016   PLT 165.0 07/18/2016   GLUCOSE 90 07/18/2016   CHOL 159 07/18/2016   TRIG 91.0 07/18/2016   HDL 50.40 07/18/2016   LDLCALC 91 07/18/2016   ALT 12 07/18/2016   AST 16 07/18/2016   NA 143 07/18/2016   K 4.1 07/18/2016   CL 105  07/18/2016   CREATININE 0.91 07/18/2016   BUN 15 07/18/2016   CO2 31 07/18/2016   TSH 1.18 07/18/2016   PSA 0.01 (L) 07/18/2016    No results found.  Assessment & Plan:   There are no diagnoses linked to this encounter.   No orders of the defined types were placed in this encounter.    Follow-up: No follow-ups on file.  Walker Kehr, MD

## 2017-07-22 NOTE — Patient Instructions (Signed)

## 2017-08-04 DIAGNOSIS — L814 Other melanin hyperpigmentation: Secondary | ICD-10-CM | POA: Diagnosis not present

## 2017-08-04 DIAGNOSIS — D0439 Carcinoma in situ of skin of other parts of face: Secondary | ICD-10-CM | POA: Diagnosis not present

## 2017-08-04 DIAGNOSIS — L570 Actinic keratosis: Secondary | ICD-10-CM | POA: Diagnosis not present

## 2017-08-04 DIAGNOSIS — Z85828 Personal history of other malignant neoplasm of skin: Secondary | ICD-10-CM | POA: Diagnosis not present

## 2017-08-04 DIAGNOSIS — L821 Other seborrheic keratosis: Secondary | ICD-10-CM | POA: Diagnosis not present

## 2017-08-04 DIAGNOSIS — D485 Neoplasm of uncertain behavior of skin: Secondary | ICD-10-CM | POA: Diagnosis not present

## 2017-09-09 DIAGNOSIS — C44329 Squamous cell carcinoma of skin of other parts of face: Secondary | ICD-10-CM | POA: Diagnosis not present

## 2017-09-09 DIAGNOSIS — Z85828 Personal history of other malignant neoplasm of skin: Secondary | ICD-10-CM | POA: Diagnosis not present

## 2017-11-19 ENCOUNTER — Ambulatory Visit (INDEPENDENT_AMBULATORY_CARE_PROVIDER_SITE_OTHER): Payer: Medicare Other

## 2017-11-19 DIAGNOSIS — Z23 Encounter for immunization: Secondary | ICD-10-CM | POA: Diagnosis not present

## 2017-12-22 ENCOUNTER — Encounter: Payer: Self-pay | Admitting: Internal Medicine

## 2017-12-22 ENCOUNTER — Ambulatory Visit (INDEPENDENT_AMBULATORY_CARE_PROVIDER_SITE_OTHER): Payer: Medicare Other | Admitting: Internal Medicine

## 2017-12-22 DIAGNOSIS — J069 Acute upper respiratory infection, unspecified: Secondary | ICD-10-CM

## 2017-12-22 MED ORDER — AZITHROMYCIN 250 MG PO TABS: ORAL_TABLET | ORAL | 0 refills | Status: DC

## 2017-12-22 NOTE — Progress Notes (Signed)
Subjective:  Patient ID: Brendan [name], male    DOB: [birth year]  Age: 67 y.o. MRN: [id number]  CC: No chief complaint on file.   HPI Brendan [name] Ask presents for  Sinusitis sx's x 1 week  Outpatient Medications Prior to Visit  Medication Sig Dispense Refill  . Cholecalciferol 1000 UNITS tablet Take 1,000 Units by mouth daily.      Marland Kitchen glycopyrrolate (ROBINUL) 2 MG tablet Take 1 tablet (2 mg total) by mouth daily. 90 tablet 3  . lovastatin (MEVACOR) 20 MG tablet Take 1 tablet (20 mg total) by mouth daily. 90 tablet 3  . NON FORMULARY 4 mg as directed. Chlorpheniramine maleate    . lansoprazole (PREVACID) 30 MG capsule Take 1 capsule (30 mg total) by mouth daily. 90 capsule 3   No facility-administered medications prior to visit.     ROS: Review of Systems  HENT: Positive for congestion, postnasal drip, rhinorrhea, sinus pressure and sinus pain.   Neurological: Positive for headaches.    Objective:  BP 126/78 (BP Location: Left Arm, Patient Position: Sitting, Cuff Size: Normal)   Pulse 67   Temp 98.4 F (36.9 C) (Oral)   Ht 5\' 9"  (1.753 m)   Wt 167 lb (75.8 kg)   SpO2 98%   BMI 24.66 kg/m   BP Readings from Last 3 Encounters:  12/22/17 126/78  07/22/17 126/74  07/15/17 126/82    Wt Readings from Last 3 Encounters:  12/22/17 167 lb (75.8 kg)  07/22/17 166 lb (75.3 kg)  07/15/17 166 lb (75.3 kg)    Physical Exam  Constitutional: He is oriented to person, place, and time. He appears well-developed. No distress.  NAD  HENT:  Mouth/Throat: Oropharynx is clear and moist.  Eyes: Pupils are equal, round, and reactive to light. Conjunctivae are normal.  Neck: Normal range of motion. No JVD present. No thyromegaly present.  Cardiovascular: Normal rate, regular rhythm, normal heart sounds and intact distal pulses. Exam reveals no gallop and no friction rub.  No murmur heard. Pulmonary/Chest: Effort normal and breath sounds normal. No respiratory distress. He has no  wheezes. He has no rales. He exhibits no tenderness.  Abdominal: Soft. Bowel sounds are normal. He exhibits no distension and no mass. There is no tenderness. There is no rebound and no guarding.  Musculoskeletal: Normal range of motion. He exhibits no edema or tenderness.  Lymphadenopathy:    He has no cervical adenopathy.  Neurological: He is alert and oriented to person, place, and time. He has normal reflexes. No cranial nerve deficit. He exhibits normal muscle tone. He displays a negative Romberg sign. Coordination and gait normal.  Skin: Skin is warm and dry. No rash noted.  Psychiatric: He has a normal mood and affect. His behavior is normal. Judgment and thought content normal.  eryth nares  Lab Results  Component Value Date   WBC 4.8 07/22/2017   HGB 14.8 07/22/2017   HCT 43.1 07/22/2017   PLT 146.0 (L) 07/22/2017   GLUCOSE 96 07/22/2017   CHOL 149 07/22/2017   TRIG 81.0 07/22/2017   HDL 55.20 07/22/2017   LDLCALC 77 07/22/2017   ALT 11 07/22/2017   AST 13 07/22/2017   NA 144 07/22/2017   K 4.9 07/22/2017   CL 107 07/22/2017   CREATININE 0.99 07/22/2017   BUN 10 07/22/2017   CO2 32 07/22/2017   TSH 1.02 07/22/2017   PSA 0.01 (L) 07/22/2017    No results found.  Assessment & Plan:  There are no diagnoses linked to this encounter.   No orders of the defined types were placed in this encounter.    Follow-up: No follow-ups on file.  Walker Kehr, MD

## 2017-12-22 NOTE — Patient Instructions (Signed)
You can use over-the-counter  "cold" medicines  such as "Tylenol cold" , "Advil cold",  "Mucinex" or" Mucinex D"  for cough and congestion.   Avoid decongestants if you have high blood pressure and use "Afrin" nasal spray for nasal congestion as directed. Use " Delsym" or" Robitussin" cough syrup varietis for cough.  You can use plain "Tylenol" or "Advil" for fever, chills and achyness. Use Halls or Ricola cough drops.   Please, make an appointment if you are not better or if you're worse.  

## 2017-12-22 NOTE — Assessment & Plan Note (Signed)
Zpac 

## 2018-01-22 DIAGNOSIS — H524 Presbyopia: Secondary | ICD-10-CM | POA: Diagnosis not present

## 2018-01-22 DIAGNOSIS — H5212 Myopia, left eye: Secondary | ICD-10-CM | POA: Diagnosis not present

## 2018-01-22 DIAGNOSIS — H52203 Unspecified astigmatism, bilateral: Secondary | ICD-10-CM | POA: Diagnosis not present

## 2018-01-22 DIAGNOSIS — H5201 Hypermetropia, right eye: Secondary | ICD-10-CM | POA: Diagnosis not present

## 2018-01-22 DIAGNOSIS — H2513 Age-related nuclear cataract, bilateral: Secondary | ICD-10-CM | POA: Diagnosis not present

## 2018-02-10 DIAGNOSIS — L570 Actinic keratosis: Secondary | ICD-10-CM | POA: Diagnosis not present

## 2018-02-10 DIAGNOSIS — D485 Neoplasm of uncertain behavior of skin: Secondary | ICD-10-CM | POA: Diagnosis not present

## 2018-02-10 DIAGNOSIS — D225 Melanocytic nevi of trunk: Secondary | ICD-10-CM | POA: Diagnosis not present

## 2018-02-10 DIAGNOSIS — Z85828 Personal history of other malignant neoplasm of skin: Secondary | ICD-10-CM | POA: Diagnosis not present

## 2018-02-10 DIAGNOSIS — D1801 Hemangioma of skin and subcutaneous tissue: Secondary | ICD-10-CM | POA: Diagnosis not present

## 2018-02-10 DIAGNOSIS — L812 Freckles: Secondary | ICD-10-CM | POA: Diagnosis not present

## 2018-02-10 DIAGNOSIS — L821 Other seborrheic keratosis: Secondary | ICD-10-CM | POA: Diagnosis not present

## 2018-02-10 DIAGNOSIS — L7211 Pilar cyst: Secondary | ICD-10-CM | POA: Diagnosis not present

## 2018-02-10 DIAGNOSIS — D0439 Carcinoma in situ of skin of other parts of face: Secondary | ICD-10-CM | POA: Diagnosis not present

## 2018-03-02 ENCOUNTER — Encounter: Payer: Self-pay | Admitting: Internal Medicine

## 2018-03-02 ENCOUNTER — Ambulatory Visit (INDEPENDENT_AMBULATORY_CARE_PROVIDER_SITE_OTHER): Payer: Medicare Other | Admitting: Internal Medicine

## 2018-03-02 ENCOUNTER — Other Ambulatory Visit: Payer: Medicare Other

## 2018-03-02 DIAGNOSIS — R011 Cardiac murmur, unspecified: Secondary | ICD-10-CM | POA: Diagnosis not present

## 2018-03-02 DIAGNOSIS — M79604 Pain in right leg: Secondary | ICD-10-CM

## 2018-03-02 NOTE — Assessment & Plan Note (Signed)
D dimer ASA

## 2018-03-02 NOTE — Patient Instructions (Signed)
Aspirin 325 mg daily with food

## 2018-03-02 NOTE — Progress Notes (Signed)
Subjective:  Patient ID: Brendan [name], male    DOB: November 27, 1950  Age: 68 y.o. MRN: [id number]  CC: No chief complaint on file.   HPI Brendan [name] presents for R calf pain x 1 week, puffy  Outpatient Medications Prior to Visit  Medication Sig Dispense Refill  . Cholecalciferol 1000 UNITS tablet Take 1,000 Units by mouth daily.      Marland Kitchen glycopyrrolate (ROBINUL) 2 MG tablet Take 1 tablet (2 mg total) by mouth daily. 90 tablet 3  . lovastatin (MEVACOR) 20 MG tablet Take 1 tablet (20 mg total) by mouth daily. 90 tablet 3  . NON FORMULARY 4 mg as directed. Chlorpheniramine maleate    . lansoprazole (PREVACID) 30 MG capsule Take 1 capsule (30 mg total) by mouth daily. 90 capsule 3  . azithromycin (ZITHROMAX Z-PAK) 250 MG tablet As directed 6 each 0   No facility-administered medications prior to visit.     ROS: Review of Systems  Constitutional: Negative for appetite change, fatigue and unexpected weight change.  HENT: Negative for congestion, nosebleeds, sneezing, sore throat and trouble swallowing.   Eyes: Negative for itching and visual disturbance.  Respiratory: Negative for cough.   Cardiovascular: Negative for chest pain, palpitations and leg swelling.  Gastrointestinal: Negative for abdominal distention, blood in stool, diarrhea and nausea.  Genitourinary: Negative for frequency and hematuria.  Musculoskeletal: Positive for myalgias. Negative for back pain, gait problem, joint swelling and neck pain.  Skin: Negative for rash.  Neurological: Negative for dizziness, tremors, speech difficulty and weakness.  Psychiatric/Behavioral: Negative for agitation, dysphoric mood and sleep disturbance. The patient is not nervous/anxious.     Objective:  BP 132/82 (BP Location: Left Arm, Patient Position: Sitting, Cuff Size: Normal)   Pulse 72   Temp 98 F (36.7 C) (Oral)   Ht 5\' 9"  (1.753 m)   Wt 171 lb (77.6 kg)   SpO2 99%   BMI 25.25 kg/m   BP Readings from Last 3  Encounters:  03/02/18 132/82  12/22/17 126/78  07/22/17 126/74    Wt Readings from Last 3 Encounters:  03/02/18 171 lb (77.6 kg)  12/22/17 167 lb (75.8 kg)  07/22/17 166 lb (75.3 kg)    Physical Exam Constitutional:      General: He is not in acute distress.    Appearance: He is well-developed.     Comments: NAD  Eyes:     Conjunctiva/sclera: Conjunctivae normal.     Pupils: Pupils are equal, round, and reactive to light.  Neck:     Musculoskeletal: Normal range of motion.     Thyroid: No thyromegaly.     Vascular: No JVD.  Cardiovascular:     Rate and Rhythm: Normal rate and regular rhythm.     Heart sounds: Murmur present. No friction rub. No gallop.   Pulmonary:     Effort: Pulmonary effort is normal. No respiratory distress.     Breath sounds: Normal breath sounds. No wheezing or rales.  Chest:     Chest wall: No tenderness.  Abdominal:     General: Bowel sounds are normal. There is no distension.     Palpations: Abdomen is soft. There is no mass.     Tenderness: There is no abdominal tenderness. There is no guarding or rebound.  Musculoskeletal: Normal range of motion.        General: No tenderness.  Lymphadenopathy:     Cervical: No cervical adenopathy.  Skin:    General: Skin is warm and  dry.     Findings: No rash.  Neurological:     Mental Status: He is alert and oriented to person, place, and time.     Cranial Nerves: No cranial nerve deficit.     Motor: No abnormal muscle tone.     Coordination: Coordination normal.     Gait: Gait normal.     Deep Tendon Reflexes: Reflexes are normal and symmetric.  Psychiatric:        Behavior: Behavior normal.        Thought Content: Thought content normal.        Judgment: Judgment normal.   R dist inner leg is sensitive a little 2-3/6 murmur  Lab Results  Component Value Date   WBC 4.8 07/22/2017   HGB 14.8 07/22/2017   HCT 43.1 07/22/2017   PLT 146.0 (L) 07/22/2017   GLUCOSE 96 07/22/2017   CHOL 149  07/22/2017   TRIG 81.0 07/22/2017   HDL 55.20 07/22/2017   LDLCALC 77 07/22/2017   ALT 11 07/22/2017   AST 13 07/22/2017   NA 144 07/22/2017   K 4.9 07/22/2017   CL 107 07/22/2017   CREATININE 0.99 07/22/2017   BUN 10 07/22/2017   CO2 32 07/22/2017   TSH 1.02 07/22/2017   PSA 0.01 (L) 07/22/2017    No results found.  Assessment & Plan:   There are no diagnoses linked to this encounter.   No orders of the defined types were placed in this encounter.    Follow-up: No follow-ups on file.  Walker Kehr, MD

## 2018-03-02 NOTE — Assessment & Plan Note (Signed)
2D ECHO 

## 2018-03-03 LAB — D-DIMER, QUANTITATIVE (NOT AT ARMC)

## 2018-03-05 DIAGNOSIS — Z85828 Personal history of other malignant neoplasm of skin: Secondary | ICD-10-CM | POA: Diagnosis not present

## 2018-03-05 DIAGNOSIS — C44329 Squamous cell carcinoma of skin of other parts of face: Secondary | ICD-10-CM | POA: Diagnosis not present

## 2018-03-17 ENCOUNTER — Ambulatory Visit (HOSPITAL_COMMUNITY): Payer: Medicare Other | Attending: Cardiology

## 2018-03-17 ENCOUNTER — Other Ambulatory Visit: Payer: Self-pay | Admitting: Internal Medicine

## 2018-03-17 DIAGNOSIS — R011 Cardiac murmur, unspecified: Secondary | ICD-10-CM | POA: Diagnosis not present

## 2018-03-17 DIAGNOSIS — I340 Nonrheumatic mitral (valve) insufficiency: Secondary | ICD-10-CM

## 2018-03-19 NOTE — H&P (View-Only) (Signed)
Cardiology Office Note   Date:  03/20/2018   ID:  Brendan [name], DOB [birth year], MRN [id number]  PCP:  Cassandria Anger, MD  Cardiologist:   No primary care provider on file. Referring:  Plotnikov, [name], MD  Chief Complaint  Patient presents with  . Heart Murmur      History of Present Illness: Brendan [name] is a 68 y.o. male who is referred by Plotnikov, [name], MD for evaluation of a mitral regurgitation.  He had an echocardiogram for evaluation of a murmur and is found to have severe MVP with mitral regurgitation.   The EF was well preserved.   The patient has never been told he had a heart murmur.  He actually feels very well.  He does not exercises routinely as he should but he does walk.  He does yard work.  He has a relatively active lifestyle. The patient denies any new symptoms such as chest discomfort, neck or arm discomfort. There has been no new shortness of breath, PND or orthopnea. There have been no reported palpitations, presyncope or syncope.     Past Medical History:  Diagnosis Date  . Cancer [facility])    prostate.  skin basal cell  . Decreased hearing   . ED (erectile dysfunction)   . GERD (gastroesophageal reflux disease)   . Hyperlipidemia   . IBS (irritable bowel syndrome)   . Ruptured cervical disc 2008    Past Surgical History:  Procedure Laterality Date  . ACHILLES TENDON SURGERY  1980   prosthesis  . AMPUTATION FINGER / THUMB Right 12/2004   tip of thumb for strep infection  . PROSTATECTOMY  2007     Current Outpatient Medications  Medication Sig Dispense Refill  . Cholecalciferol 1000 UNITS tablet Take 1,000 Units by mouth daily.      Marland Kitchen glycopyrrolate (ROBINUL) 2 MG tablet Take 1 tablet (2 mg total) by mouth daily. 90 tablet 3  . lovastatin (MEVACOR) 20 MG tablet Take 1 tablet (20 mg total) by mouth daily. 90 tablet 3  . NON FORMULARY 4 mg as directed. Chlorpheniramine maleate    . lansoprazole (PREVACID) 30 MG capsule Take  1 capsule (30 mg total) by mouth daily. 90 capsule 3   No current facility-administered medications for this visit.     Allergies:   Hydrocodone-acetaminophen    Social History:  The patient  reports that he has never smoked. He has never used smokeless tobacco. He reports that he does not drink alcohol or use drugs.   Family History:  The patient's family history includes Heart disease in his mother; Heart disease (age of onset: 22) in his father; Hyperlipidemia in an other family member.    ROS:  Please see the history of present illness.   Otherwise, review of systems are positive for none.   All other systems are reviewed and negative.    PHYSICAL EXAM: VS:  BP 130/80   Pulse 67   Ht 5\' 9"  (1.753 m)   Wt 171 lb (77.6 kg)   BMI 25.25 kg/m  , BMI Body mass index is 25.25 kg/m. GENERAL:  Well appearing HEENT:  Pupils equal round and reactive, fundi not visualized, oral mucosa unremarkable NECK:  No jugular venous distention, waveform within normal limits, carotid upstroke brisk and symmetric, no bruits, no thyromegaly LYMPHATICS:  No cervical, inguinal adenopathy LUNGS:  Clear to auscultation bilaterally BACK:  No CVA tenderness CHEST:  Unremarkable HEART:  PMI not displaced or  sustained,S1 and S2 within normal limits, no S3, no S4, no clicks, no rubs, 3 out of 6 holosystolic murmur heard throughout the precordium, no diastolic murmurs ABD:  Flat, positive bowel sounds normal in frequency in pitch, no bruits, no rebound, no guarding, no midline pulsatile mass, no hepatomegaly, no splenomegaly EXT:  2 plus pulses throughout, no edema, no cyanosis no clubbing, missing the tip of his right thumb, mild lower extremity varicosities SKIN:  No rashes no nodules NEURO:  Cranial nerves II through XII grossly intact, motor grossly intact throughout PSYCH:  Cognitively intact, oriented to person place and time    EKG:  EKG is ordered today. The ekg ordered today demonstrates sinus  rhythm, rate 67, axis within normal limits, intervals within normal limits, no acute ST-T wave changes.   Recent Labs: 07/22/2017: ALT 11; BUN 10; Creatinine, Ser 0.99; Hemoglobin 14.8; Platelets 146.0; Potassium 4.9; Sodium 144; TSH 1.02    Lipid Panel    Component Value Date/Time   CHOL 149 07/22/2017 1025   TRIG 81.0 07/22/2017 1025   HDL 55.20 07/22/2017 1025   CHOLHDL 3 07/22/2017 1025   VLDL 16.2 07/22/2017 1025   LDLCALC 77 07/22/2017 1025      Wt Readings from Last 3 Encounters:  03/20/18 171 lb (77.6 kg)  03/02/18 171 lb (77.6 kg)  12/22/17 167 lb (75.8 kg)      Other studies Reviewed:  Echo Additional studies/ records that were reviewed today include: . Review of the above records demonstrates:  Please see elsewhere in the note.    1. The left ventricle has normal systolic function of 93-73%. The cavity size was normal. Left ventricular diastolic Doppler parameters are consistent with pseudonormal Elevated left ventricular end-diastolic pressure.  2. The right ventricle has normal systolic function. The cavity was normal. There is no increase in right ventricular wall thickness.  3. Left atrial size was mildly dilated.  4. Right atrial size was mildly dilated.  5. Severe mitral valve prolapse.  6. 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 There is moderate thickening. Mitral valve regurgitation is severe by color flow Doppler. The MR jet is anteriorly-directed.  7. The tricuspid valve is normal in structure.  8. The aortic valve is tricuspid There is mild thickening of the aortic valve.  9. The pulmonic valve was normal in structure. 10. There is bi-leaflet mitral valve prolapse posterior > anterior with anteriorly directed jet of mitral regurgitation that is severe. A TEE Is recommended.  ASSESSMENT AND PLAN:  MR:  I reviewed the echocardiogram images personally for this appt. he has severe mitral regurgitation as listed above.  He likely has a  repairable valve.  I am going to send him for a TEE.  We talked about surgical options.  We talked about the probable need for cardiac catheterization prior to repair.  We could also consider coronary CTA.  Though not strictly required I have suggested endocarditis prophylaxis given the degree of mitral regurgitation abnormality the valve.  He is due to have dental surgery soon.  DYSLIPIDEMIA: Lipids are excellent with an LDL of 77 and an HDL of 55.  No change in therapy.   Current medicines are reviewed at length with the patient today.  The patient does not have concerns regarding medicines.  The following changes have been made:  no change  Labs/ tests ordered today include:   Orders Placed This Encounter  Procedures  . CBC  . Basic Metabolic Panel (BMET)  . EKG  12-Lead  . TRANSESOPHAGEAL ECHOCARDIOGRAM W/O CARDIOVERSION     Disposition:   FU with me after the TEE    Signed, Minus Breeding, MD  03/20/2018 11:16 AM    [place]

## 2018-03-19 NOTE — Progress Notes (Signed)
Cardiology Office Note   Date:  03/20/2018   ID:  Brendan [name], DOB [birth year], MRN [id number]  PCP:  Cassandria Anger, MD  Cardiologist:   No primary care provider on file. Referring:  Plotnikov, [name], MD  Chief Complaint  Patient presents with  . Heart Murmur      History of Present Illness: Brendan [name] is a 68 y.o. male who is referred by Plotnikov, [name], MD for evaluation of a mitral regurgitation.  He had an echocardiogram for evaluation of a murmur and is found to have severe MVP with mitral regurgitation.   The EF was well preserved.   The patient has never been told he had a heart murmur.  He actually feels very well.  He does not exercises routinely as he should but he does walk.  He does yard work.  He has a relatively active lifestyle. The patient denies any new symptoms such as chest discomfort, neck or arm discomfort. There has been no new shortness of breath, PND or orthopnea. There have been no reported palpitations, presyncope or syncope.     Past Medical History:  Diagnosis Date  . Cancer [facility])    prostate.  skin basal cell  . Decreased hearing   . ED (erectile dysfunction)   . GERD (gastroesophageal reflux disease)   . Hyperlipidemia   . IBS (irritable bowel syndrome)   . Ruptured cervical disc 2008    Past Surgical History:  Procedure Laterality Date  . ACHILLES TENDON SURGERY  1980   prosthesis  . AMPUTATION FINGER / THUMB Right 12/2004   tip of thumb for strep infection  . PROSTATECTOMY  2007     Current Outpatient Medications  Medication Sig Dispense Refill  . Cholecalciferol 1000 UNITS tablet Take 1,000 Units by mouth daily.      Marland Kitchen glycopyrrolate (ROBINUL) 2 MG tablet Take 1 tablet (2 mg total) by mouth daily. 90 tablet 3  . lovastatin (MEVACOR) 20 MG tablet Take 1 tablet (20 mg total) by mouth daily. 90 tablet 3  . NON FORMULARY 4 mg as directed. Chlorpheniramine maleate    . lansoprazole (PREVACID) 30 MG capsule Take  1 capsule (30 mg total) by mouth daily. 90 capsule 3   No current facility-administered medications for this visit.     Allergies:   Hydrocodone-acetaminophen    Social History:  The patient  reports that he has never smoked. He has never used smokeless tobacco. He reports that he does not drink alcohol or use drugs.   Family History:  The patient's family history includes Heart disease in his mother; Heart disease (age of onset: 32) in his father; Hyperlipidemia in an other family member.    ROS:  Please see the history of present illness.   Otherwise, review of systems are positive for none.   All other systems are reviewed and negative.    PHYSICAL EXAM: VS:  BP 130/80   Pulse 67   Ht 5\' 9"  (1.753 m)   Wt 171 lb (77.6 kg)   BMI 25.25 kg/m  , BMI Body mass index is 25.25 kg/m. GENERAL:  Well appearing HEENT:  Pupils equal round and reactive, fundi not visualized, oral mucosa unremarkable NECK:  No jugular venous distention, waveform within normal limits, carotid upstroke brisk and symmetric, no bruits, no thyromegaly LYMPHATICS:  No cervical, inguinal adenopathy LUNGS:  Clear to auscultation bilaterally BACK:  No CVA tenderness CHEST:  Unremarkable HEART:  PMI not displaced or  sustained,S1 and S2 within normal limits, no S3, no S4, no clicks, no rubs, 3 out of 6 holosystolic murmur heard throughout the precordium, no diastolic murmurs ABD:  Flat, positive bowel sounds normal in frequency in pitch, no bruits, no rebound, no guarding, no midline pulsatile mass, no hepatomegaly, no splenomegaly EXT:  2 plus pulses throughout, no edema, no cyanosis no clubbing, missing the tip of his right thumb, mild lower extremity varicosities SKIN:  No rashes no nodules NEURO:  Cranial nerves II through XII grossly intact, motor grossly intact throughout PSYCH:  Cognitively intact, oriented to person place and time    EKG:  EKG is ordered today. The ekg ordered today demonstrates sinus  rhythm, rate 67, axis within normal limits, intervals within normal limits, no acute ST-T wave changes.   Recent Labs: 07/22/2017: ALT 11; BUN 10; Creatinine, Ser 0.99; Hemoglobin 14.8; Platelets 146.0; Potassium 4.9; Sodium 144; TSH 1.02    Lipid Panel    Component Value Date/Time   CHOL 149 07/22/2017 1025   TRIG 81.0 07/22/2017 1025   HDL 55.20 07/22/2017 1025   CHOLHDL 3 07/22/2017 1025   VLDL 16.2 07/22/2017 1025   LDLCALC 77 07/22/2017 1025      Wt Readings from Last 3 Encounters:  03/20/18 171 lb (77.6 kg)  03/02/18 171 lb (77.6 kg)  12/22/17 167 lb (75.8 kg)      Other studies Reviewed:  Echo Additional studies/ records that were reviewed today include: . Review of the above records demonstrates:  Please see elsewhere in the note.    1. The left ventricle has normal systolic function of 40-98%. The cavity size was normal. Left ventricular diastolic Doppler parameters are consistent with pseudonormal Elevated left ventricular end-diastolic pressure.  2. The right ventricle has normal systolic function. The cavity was normal. There is no increase in right ventricular wall thickness.  3. Left atrial size was mildly dilated.  4. Right atrial size was mildly dilated.  5. Severe mitral valve prolapse.  6. 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 There is moderate thickening. Mitral valve regurgitation is severe by color flow Doppler. The MR jet is anteriorly-directed.  7. The tricuspid valve is normal in structure.  8. The aortic valve is tricuspid There is mild thickening of the aortic valve.  9. The pulmonic valve was normal in structure. 10. There is bi-leaflet mitral valve prolapse posterior > anterior with anteriorly directed jet of mitral regurgitation that is severe. A TEE Is recommended.  ASSESSMENT AND PLAN:  MR:  I reviewed the echocardiogram images personally for this appt. he has severe mitral regurgitation as listed above.  He likely has a  repairable valve.  I am going to send him for a TEE.  We talked about surgical options.  We talked about the probable need for cardiac catheterization prior to repair.  We could also consider coronary CTA.  Though not strictly required I have suggested endocarditis prophylaxis given the degree of mitral regurgitation abnormality the valve.  He is due to have dental surgery soon.  DYSLIPIDEMIA: Lipids are excellent with an LDL of 77 and an HDL of 55.  No change in therapy.   Current medicines are reviewed at length with the patient today.  The patient does not have concerns regarding medicines.  The following changes have been made:  no change  Labs/ tests ordered today include:   Orders Placed This Encounter  Procedures  . CBC  . Basic Metabolic Panel (BMET)  . EKG  12-Lead  . TRANSESOPHAGEAL ECHOCARDIOGRAM W/O CARDIOVERSION     Disposition:   FU with me after the TEE    Signed, Minus Breeding, MD  03/20/2018 11:16 AM    [place]

## 2018-03-20 ENCOUNTER — Ambulatory Visit (INDEPENDENT_AMBULATORY_CARE_PROVIDER_SITE_OTHER): Payer: Medicare Other | Admitting: Cardiology

## 2018-03-20 ENCOUNTER — Encounter: Payer: Self-pay | Admitting: Cardiology

## 2018-03-20 VITALS — BP 130/80 | HR 67 | Ht 69.0 in | Wt 171.0 lb

## 2018-03-20 DIAGNOSIS — I340 Nonrheumatic mitral (valve) insufficiency: Secondary | ICD-10-CM | POA: Diagnosis not present

## 2018-03-20 DIAGNOSIS — E785 Hyperlipidemia, unspecified: Secondary | ICD-10-CM | POA: Diagnosis not present

## 2018-03-20 DIAGNOSIS — I341 Nonrheumatic mitral (valve) prolapse: Secondary | ICD-10-CM | POA: Insufficient documentation

## 2018-03-20 NOTE — Patient Instructions (Signed)
Medication Instructions:  Continue current medications  If you need a refill on your cardiac medications before your next appointment, please call your pharmacy.  Labwork: BMP and CBC  Take the provided lab slips with you to the lab for your blood draw.   When you have your labs (blood work) drawn today and your tests are completely normal, you will receive your results only by MyChart Message (if you have MyChart) -OR-  A paper copy in the mail.  If you have any lab test that is abnormal or we need to change your treatment, we will call you to review these results.  Testing/Procedures: Your physician has requested that you have a TEE. During a TEE, sound waves are used to create images of your heart. It provides your doctor with information about the size and shape of your heart and how well your heart's chambers and valves are working. In this test, a transducer is attached to the end of a flexible tube that's guided down your throat and into your esophagus (the tube leading from you mouth to your stomach) to get a more detailed image of your heart. You are not awake for the procedure. Please see the instruction sheet given to you today. For further information please visit [email or web address].  Special Instructions: Dear Esther Hardy  You are scheduled for a TEE on          with Dr.                                 .  Please arrive at the [facility] (Main Entrance A) at [facility]: [place], [place] [address] at                 am/pm. (1 hour prior to procedure unless lab work is needed; if lab work is needed arrive 1.5 hours ahead)  DIET: Nothing to eat or drink after midnight except a sip of water with medications (see medication instructions below)  Medication Instructions   Labs: CBC and BMP  You must have a responsible person to drive you home and stay in the waiting area during your procedure. Failure to do so could result in cancellation.  Bring your  insurance cards.  *Special Note: Every effort is made to have your procedure done on time. Occasionally there are emergencies that occur at the hospital that may cause delays. Please be patient if a delay does occur.    Follow-Up: . Your physician recommends that you schedule a follow-up appointment in: As Needed   At [facility], you and your health needs are our priority.  As part of our continuing mission to provide you with exceptional heart care, we have created designated Provider Care Teams.  These Care Teams include your primary Cardiologist (physician) and Advanced Practice Providers (APPs -  Physician Assistants and Nurse Practitioners) who all work together to provide you with the care you need, when you need it.  Thank you for choosing [facility] at [facility]!!

## 2018-03-30 DIAGNOSIS — I340 Nonrheumatic mitral (valve) insufficiency: Secondary | ICD-10-CM | POA: Diagnosis not present

## 2018-03-30 LAB — BASIC METABOLIC PANEL
BUN/Creatinine Ratio: 11 (ref 10–24)
BUN: 11 mg/dL (ref 8–27)
CO2: 25 mmol/L (ref 20–29)
Calcium: 9.2 mg/dL (ref 8.6–10.2)
Chloride: 103 mmol/L (ref 96–106)
Creatinine, Ser: 0.99 mg/dL (ref 0.76–1.27)
GFR calc Af Amer: 89 mL/min/{1.73_m2} (ref >59)
GFR calc non Af Amer: 77 mL/min/{1.73_m2} (ref >59)
Glucose: 76 mg/dL (ref 65–99)
Potassium: 4.5 mmol/L (ref 3.5–5.2)
SODIUM: 142 mmol/L (ref 134–144)

## 2018-03-30 LAB — CBC
Hematocrit: 43.1 % (ref 37.5–51.0)
Hemoglobin: 14.8 g/dL (ref 13.0–17.7)
MCH: 30.3 pg (ref 26.6–33.0)
MCHC: 34.3 g/dL (ref 31.5–35.7)
MCV: 88 fL (ref 79–97)
Platelets: 162 10*3/uL (ref 150–450)
RBC: 4.88 x10E6/uL (ref 4.14–5.80)
RDW: 12.9 % (ref 11.6–15.4)
WBC: 6.7 10*3/uL (ref 3.4–10.8)

## 2018-04-05 NOTE — Anesthesia Preprocedure Evaluation (Addendum)
Anesthesia Evaluation  Patient identified by MRN, date of birth, ID band Patient awake    Reviewed: Allergy & Precautions, NPO status , Patient's Chart, lab work & pertinent test results  History of Anesthesia Complications Negative for: history of anesthetic complications  Airway Mallampati: I  TM Distance: >3 FB Neck ROM: Full    Dental  (+) Teeth Intact, Dental Advisory Given   Pulmonary neg pulmonary ROS,    Pulmonary exam normal breath sounds clear to auscultation       Cardiovascular Normal cardiovascular exam+ Valvular Problems/Murmurs MVP and MR  Rhythm:Regular Rate:Normal  TTE 03/17/18: EF 60-65%, mild LAE/RAE, severe mitral valve prolapse, severe MR     Neuro/Psych negative neurological ROS     GI/Hepatic Neg liver ROS, GERD  Controlled and Medicated,  Endo/Other  negative endocrine ROS  Renal/GU negative Renal ROS     Musculoskeletal negative musculoskeletal ROS (+)   Abdominal   Peds  Hematology negative hematology ROS (+)   Anesthesia Other Findings Day of surgery medications reviewed with the patient.  Reproductive/Obstetrics                            Anesthesia Physical Anesthesia Plan  ASA: III  Anesthesia Plan: MAC   Post-op Pain Management:    Induction:   PONV Risk Score and Plan: Treatment may vary due to age or medical condition and Propofol infusion  Airway Management Planned: Natural Airway and Nasal Cannula  Additional Equipment:   Intra-op Plan:   Post-operative Plan:   Informed Consent: I have reviewed the patients History and Physical, chart, labs and discussed the procedure including the risks, benefits and alternatives for the proposed anesthesia with the patient or authorized representative who has indicated his/her understanding and acceptance.     Dental advisory given  Plan Discussed with: CRNA  Anesthesia Plan Comments:         Anesthesia Quick Evaluation

## 2018-04-06 ENCOUNTER — Ambulatory Visit (HOSPITAL_BASED_OUTPATIENT_CLINIC_OR_DEPARTMENT_OTHER): Payer: Medicare Other

## 2018-04-06 ENCOUNTER — Encounter (HOSPITAL_COMMUNITY)
Admission: RE | Disposition: A | Discharge status: Home / Self Care | Payer: Self-pay | Source: Home / Self Care | Attending: Cardiovascular Disease

## 2018-04-06 ENCOUNTER — Ambulatory Visit (HOSPITAL_COMMUNITY): Payer: Medicare Other | Admitting: Certified Registered Nurse Anesthetist

## 2018-04-06 ENCOUNTER — Encounter (HOSPITAL_COMMUNITY): Payer: Self-pay | Admitting: Certified Registered Nurse Anesthetist

## 2018-04-06 ENCOUNTER — Ambulatory Visit (HOSPITAL_COMMUNITY)
Admission: RE | Admit: 2018-04-06 | Discharge: 2018-04-06 | Disposition: A | Discharge status: Home / Self Care | Payer: Medicare Other | Attending: Cardiovascular Disease | Admitting: Cardiovascular Disease

## 2018-04-06 ENCOUNTER — Other Ambulatory Visit: Payer: Self-pay

## 2018-04-06 DIAGNOSIS — Z885 Allergy status to narcotic agent status: Secondary | ICD-10-CM | POA: Insufficient documentation

## 2018-04-06 DIAGNOSIS — E785 Hyperlipidemia, unspecified: Secondary | ICD-10-CM | POA: Insufficient documentation

## 2018-04-06 DIAGNOSIS — K589 Irritable bowel syndrome without diarrhea: Secondary | ICD-10-CM | POA: Diagnosis not present

## 2018-04-06 DIAGNOSIS — Z79899 Other long term (current) drug therapy: Secondary | ICD-10-CM | POA: Diagnosis not present

## 2018-04-06 DIAGNOSIS — I341 Nonrheumatic mitral (valve) prolapse: Secondary | ICD-10-CM | POA: Diagnosis not present

## 2018-04-06 DIAGNOSIS — I340 Nonrheumatic mitral (valve) insufficiency: Secondary | ICD-10-CM | POA: Insufficient documentation

## 2018-04-06 DIAGNOSIS — K219 Gastro-esophageal reflux disease without esophagitis: Secondary | ICD-10-CM | POA: Diagnosis not present

## 2018-04-06 DIAGNOSIS — R011 Cardiac murmur, unspecified: Secondary | ICD-10-CM | POA: Diagnosis not present

## 2018-04-06 HISTORY — PX: TEE WITHOUT CARDIOVERSION: SHX5443

## 2018-04-06 SURGERY — ECHOCARDIOGRAM, TRANSESOPHAGEAL
Anesthesia: Monitor Anesthesia Care

## 2018-04-06 MED ORDER — LACTATED RINGERS IV SOLN
INTRAVENOUS | Status: DC
  Administered 2018-04-06: 1000 mL via INTRAVENOUS

## 2018-04-06 MED ORDER — PROPOFOL 10 MG/ML IV BOLUS
INTRAVENOUS | Status: DC | PRN
  Administered 2018-04-06: 20 mg via INTRAVENOUS

## 2018-04-06 MED ORDER — PROMETHAZINE HCL 25 MG/ML IJ SOLN: 6.2500 mg | INTRAMUSCULAR | Status: DC | PRN

## 2018-04-06 MED ORDER — PROPOFOL 500 MG/50ML IV EMUL
INTRAVENOUS | Status: DC | PRN
  Administered 2018-04-06: 100 ug/kg/min via INTRAVENOUS

## 2018-04-06 MED ORDER — ONDANSETRON HCL 4 MG/2ML IJ SOLN
INTRAMUSCULAR | Status: DC | PRN
  Administered 2018-04-06: 4 mg via INTRAVENOUS

## 2018-04-06 NOTE — Interval H&P Note (Signed)
History and Physical Interval Note:  04/06/2018 7:42 AM  Brendan [name]  has presented today for surgery, with the diagnosis of mitral valve regurg  The various methods of treatment have been discussed with the patient and family. After consideration of risks, benefits and other options for treatment, the patient has consented to  Procedure(s): TRANSESOPHAGEAL ECHOCARDIOGRAM (TEE) (N/A) as a surgical intervention .  The patient's history has been reviewed, patient examined, no change in status, stable for surgery.  I have reviewed the patient's chart and labs.  Questions were answered to the patient's satisfaction.     [name]

## 2018-04-06 NOTE — Op Note (Signed)
INDICATIONS: mitral insufficiency  PROCEDURE:   Informed consent was obtained prior to the procedure. The risks, benefits and alternatives for the procedure were discussed and the patient comprehended these risks.  Risks include, but are not limited to, cough, sore throat, vomiting, nausea, somnolence, esophageal and stomach trauma or perforation, bleeding, low blood pressure, aspiration, pneumonia, infection, trauma to the teeth and death.    After a procedural time-out, the oropharynx was anesthetized with 20% benzocaine spray.   During this procedure the patient was administered IV propofol by Anesthesiology (Dr. Daiva Huge). The transesophageal probe was inserted in the esophagus and stomach without difficulty and multiple views were obtained.  The patient was kept under observation until the patient left the procedure room.  The patient left the procedure room in stable condition.   Agitated microbubble saline contrast was not administered.  COMPLICATIONS:    There were no immediate complications.  FINDINGS:  Severe mitral regurgitation due to prolapse of the P2 (middle) and P1 (lateral) segments of of the posterior mitral leaflet. Otherwise normal echo.  RECOMMENDATIONS:    Consider referral for mitral valve repair.  Time Spent Directly with the Patient:  30 minutes   Esley Brooking 04/06/2018, 9:31 AM

## 2018-04-06 NOTE — Discharge Instructions (Signed)

## 2018-04-06 NOTE — Progress Notes (Signed)
  Echocardiogram Echocardiogram Transesophageal has been performed.  04/06/2018, 9:48 AM

## 2018-04-06 NOTE — Transfer of Care (Signed)
Immediate Anesthesia Transfer of Care Note  Patient: Brendan [name]  Procedure(s) Performed: TRANSESOPHAGEAL ECHOCARDIOGRAM (TEE) (N/A )  Patient Location: PACU  Anesthesia Type:MAC  Level of Consciousness: awake and drowsy  Airway & Oxygen Therapy: Patient Spontanous Breathing  Post-op Assessment: Report given to RN and Post -op Vital signs reviewed and stable  Post vital signs: Reviewed and stable  Last Vitals:  Vitals Value Taken Time  BP 110/53 04/06/2018  9:38 AM  Temp    Pulse 63 04/06/2018  9:38 AM  Resp 13 04/06/2018  9:38 AM  SpO2 96 % 04/06/2018  9:38 AM  Vitals shown include unvalidated device data.  Last Pain:  Vitals:   04/06/18 0807  TempSrc: Oral  PainSc: 0-No pain         Complications: No apparent anesthesia complications

## 2018-04-06 NOTE — Anesthesia Postprocedure Evaluation (Signed)
Anesthesia Post Note  Patient: Brendan [name]  Procedure(s) Performed: TRANSESOPHAGEAL ECHOCARDIOGRAM (TEE) (N/A )     Patient location during evaluation: PACU Anesthesia Type: MAC Level of consciousness: awake and alert Pain management: pain level controlled Vital Signs Assessment: post-procedure vital signs reviewed and stable Respiratory status: spontaneous breathing, nonlabored ventilation and respiratory function stable Cardiovascular status: blood pressure returned to baseline and stable Postop Assessment: no apparent nausea or vomiting Anesthetic complications: no    Last Vitals:  Vitals:   04/06/18 0940 04/06/18 0948  BP: (!) 110/53 114/70  Pulse: 63 66  Resp: 13 16  Temp: 37 C   SpO2: 96% 97%    Last Pain:  Vitals:   04/06/18 0948  TempSrc:   PainSc: 0-No pain                 Brennan Bailey

## 2018-04-08 NOTE — Telephone Encounter (Signed)
Follow up scheduled with dr hochrein to discuss TEE.

## 2018-04-12 NOTE — Progress Notes (Signed)
Cardiology Office Note   Date:  04/13/2018   ID:  OSSIEL MARCHIO, DOB [birth year], MRN [id number]  PCP:  Cassandria Anger, MD  Cardiologist:   No primary care provider on file. Referring:  Plotnikov, [name], MD    Chief Complaint  Patient presents with  . MR     History of Present Illness: Brendan [name] is a 68 y.o. male who is referred by Plotnikov, [name], MD for evaluation of a mitral regurgitation.  He had an echocardiogram for evaluation of a murmur and is found to have severe MVP with mitral regurgitation.   The EF was well preserved.   He had a TEE with results as below and returns to follow up on this.    He came back today to talk about this.  He might have some SOB.  The patient denies any new symptoms such as chest discomfort, neck or arm discomfort. There has been no new  PND or orthopnea. There have been no reported palpitations, presyncope or syncope.    Past Medical History:  Diagnosis Date  . Cancer [facility])    prostate.  skin basal cell  . Decreased hearing   . ED (erectile dysfunction)   . GERD (gastroesophageal reflux disease)   . Hyperlipidemia   . IBS (irritable bowel syndrome)   . Ruptured cervical disc 2008    Past Surgical History:  Procedure Laterality Date  . ACHILLES TENDON SURGERY  1980   prosthesis  . AMPUTATION FINGER / THUMB Right 12/2004   tip of thumb for strep infection  . PROSTATECTOMY  2007  . TEE WITHOUT CARDIOVERSION N/A 04/06/2018   Procedure: TRANSESOPHAGEAL ECHOCARDIOGRAM (TEE);  Surgeon: Sanda Klein, MD;  Location: [facility] ENDOSCOPY;  Service: Cardiovascular;  Laterality: N/A;     Current Outpatient Medications  Medication Sig Dispense Refill  . chlorpheniramine (CHLOR-TRIMETON) 4 MG tablet Take 4 mg by mouth daily.    Marland Kitchen glycopyrrolate (ROBINUL) 2 MG tablet Take 1 tablet (2 mg total) by mouth daily. 90 tablet 3  . lansoprazole (PREVACID) 30 MG capsule Take 1 capsule (30 mg total) by mouth daily. 90 capsule 3  .  lovastatin (MEVACOR) 20 MG tablet Take 1 tablet (20 mg total) by mouth daily. 90 tablet 3  . amoxicillin (AMOXIL) 500 MG capsule Take 4 capsules (2,000 mg total) by mouth once for 1 dose. 4 capsule 0   No current facility-administered medications for this visit.     Allergies:   Hydrocodone    ROS:  Please see the history of present illness.   Otherwise, review of systems are positive for none.   All other systems are reviewed and negative.    PHYSICAL EXAM: VS:  BP 124/72   Pulse 73   Ht 5\' 9"  (1.753 m)   Wt 170 lb 3.2 oz (77.2 kg)   BMI 25.13 kg/m  , BMI Body mass index is 25.13 kg/m. GENERAL:  Well appearing NECK:  No jugular venous distention, waveform within normal limits, carotid upstroke brisk and symmetric, no bruits, no thyromegaly LUNGS:  Clear to auscultation bilaterally CHEST:  Unremarkable HEART:  PMI not displaced or sustained,S1 and S2 within normal limits, no S3, no S4, no clicks, no rubs, 3/6 holosystolic murmur at the apex and into the axilla, no diastolic murmurs ABD:  Flat, positive bowel sounds normal in frequency in pitch, no bruits, no rebound, no guarding, no midline pulsatile mass, no hepatomegaly, no splenomegaly EXT:  2 plus pulses throughout, no  edema, no cyanosis no clubbing    EKG:  EKG is ordered today. The ekg ordered today demonstrates sinus rhythm, rate 73, axis within normal limits, intervals within normal limits, no acute ST-T wave changes.   Recent Labs: 07/22/2017: ALT 11; TSH 1.02 03/30/2018: BUN 11; Creatinine, Ser 0.99; Hemoglobin 14.8; Platelets 162; Potassium 4.5; Sodium 142    Lipid Panel    Component Value Date/Time   CHOL 149 07/22/2017 1025   TRIG 81.0 07/22/2017 1025   HDL 55.20 07/22/2017 1025   CHOLHDL 3 07/22/2017 1025   VLDL 16.2 07/22/2017 1025   LDLCALC 77 07/22/2017 1025      Wt Readings from Last 3 Encounters:  04/13/18 170 lb 3.2 oz (77.2 kg)  03/20/18 171 lb (77.6 kg)  03/02/18 171 lb (77.6 kg)       Other studies Reviewed:  Echo  1. The left ventricle has normal systolic function, with an ejection fraction of 60-65%. The cavity size was normal. Left ventricular diastolic parameters were normal. No evidence of left ventricular regional wall motion abnormalities.  2. 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 There is particularly severe prolapse of the lateral scallop of the posterior leaflet (P1), moderate prolapse of the middle scallop of the posterior leaflet (P2).  3. The right ventricle has normal systolic function. The wall thickness and cavity size was normal.  4. Right ventricular systolic pressure is normal with an estimated pressure of 26 mmHg.  5. Moderate to severe mitral valve regurgitation. The jet is eccentric, anteriorly and medially directed.  6. Trivial aortic valve regurgitation.  7. The aortic root, ascending aorta, aortic arch and descending aorta are normal in size and structure.   ASSESSMENT AND PLAN:  MR:  He has no symptoms and NL LV function.  ESD is 33 mm.  ERO is less than 0. 4 cm2.  Regurgitant volume is less than 60 ml by PISA.  Regurgitant fraction is less than 55%.   This would suggest moderately severe MR.   However, visually there is severe prolapse as described and the appearance would appear to be amenable to repair.  I brought him back to discuss the options of continued observation vs surgical referral.  I have suggested follow up now with Dr. Roxy Manns as he has favorable anatomy for repair and otherwise low surgical risk.    I will defer scheduling cardiac catheterization until he understand the timing of valve surgery.  But he will need this prior.   Current medicines are reviewed at length with the patient today.  The patient does not have concerns regarding medicines.  The following changes have been made:  None  Labs/ tests ordered today include:   None  Orders Placed This Encounter  Procedures  . Ambulatory referral to  Cardiothoracic Surgery     Disposition:   FU with me after his follow up with surgery.     Signed, Minus Breeding, MD  04/13/2018 8:57 AM    [place]

## 2018-04-13 ENCOUNTER — Encounter: Payer: Self-pay | Admitting: Cardiology

## 2018-04-13 ENCOUNTER — Ambulatory Visit (INDEPENDENT_AMBULATORY_CARE_PROVIDER_SITE_OTHER): Payer: Medicare Other | Admitting: Cardiology

## 2018-04-13 VITALS — BP 124/72 | HR 73 | Ht 69.0 in | Wt 170.2 lb

## 2018-04-13 DIAGNOSIS — I340 Nonrheumatic mitral (valve) insufficiency: Secondary | ICD-10-CM | POA: Diagnosis not present

## 2018-04-13 MED ORDER — AMOXICILLIN 500 MG PO CAPS: 2000.0000 mg | ORAL_CAPSULE | Freq: Once | ORAL | 0 refills | Status: AC

## 2018-04-13 NOTE — Patient Instructions (Addendum)
Medication Instructions:  Continue current medications  If you need a refill on your cardiac medications before your next appointment, please call your pharmacy.  Labwork: None Ordered   Testing/Procedures: None Ordered   Follow-Up: You have been referred to DR [place] physician recommends that you schedule a follow-up appointment in: 2 Months with Dr Percival Spanish   At [facility], you and your health needs are our priority.  As part of our continuing mission to provide you with exceptional heart care, we have created designated Provider Care Teams.  These Care Teams include your primary Cardiologist (physician) and Advanced Practice Providers (APPs -  Physician Assistants and Nurse Practitioners) who all work together to provide you with the care you need, when you need it.  Thank you for choosing [facility] at [facility]!!

## 2018-04-17 NOTE — Addendum Note (Signed)
Addended by: Vennie Homans on: 04/17/2018 03:24 PM   Modules accepted: Orders

## 2018-04-27 ENCOUNTER — Encounter: Payer: Medicare Other | Admitting: Thoracic Surgery (Cardiothoracic Vascular Surgery)

## 2018-05-11 ENCOUNTER — Encounter: Payer: Medicare Other | Admitting: Thoracic Surgery (Cardiothoracic Vascular Surgery)

## 2018-06-01 ENCOUNTER — Encounter: Payer: Self-pay | Admitting: Thoracic Surgery (Cardiothoracic Vascular Surgery)

## 2018-06-01 ENCOUNTER — Telehealth: Payer: Self-pay | Admitting: Thoracic Surgery (Cardiothoracic Vascular Surgery)

## 2018-06-01 NOTE — Telephone Encounter (Signed)
Called patient to discuss plans regarding his upcoming appointment for elective consultation regarding management of severe primary mitral regurgitation.  The patient states that he has been developing progressive symptoms of exertional shortness of breath that he is concerned about and he would like to keep his face-to-face appointment on schedule rather than conducting a virtual appointment or delaying the appointment.  [name], MD 06/01/2018 11:24 AM

## 2018-06-05 ENCOUNTER — Other Ambulatory Visit: Payer: Self-pay

## 2018-06-08 ENCOUNTER — Encounter: Payer: Self-pay | Admitting: Thoracic Surgery (Cardiothoracic Vascular Surgery)

## 2018-06-08 ENCOUNTER — Other Ambulatory Visit: Payer: Self-pay

## 2018-06-08 ENCOUNTER — Institutional Professional Consult (permissible substitution) (INDEPENDENT_AMBULATORY_CARE_PROVIDER_SITE_OTHER): Payer: Medicare Other | Admitting: Thoracic Surgery (Cardiothoracic Vascular Surgery)

## 2018-06-08 ENCOUNTER — Other Ambulatory Visit: Payer: Self-pay | Admitting: *Deleted

## 2018-06-08 ENCOUNTER — Encounter: Payer: Self-pay | Admitting: *Deleted

## 2018-06-08 VITALS — BP 132/78 | HR 72 | Temp 97.7°F | Ht 69.0 in | Wt 165.0 lb

## 2018-06-08 DIAGNOSIS — Z01818 Encounter for other preprocedural examination: Secondary | ICD-10-CM

## 2018-06-08 DIAGNOSIS — I340 Nonrheumatic mitral (valve) insufficiency: Secondary | ICD-10-CM | POA: Diagnosis not present

## 2018-06-08 DIAGNOSIS — I341 Nonrheumatic mitral (valve) prolapse: Secondary | ICD-10-CM | POA: Diagnosis not present

## 2018-06-08 NOTE — Patient Instructions (Signed)
Continue all previous medications without any changes at this time  

## 2018-06-08 NOTE — Progress Notes (Signed)
[address]       [place] [address]             [phone]     CARDIOTHORACIC SURGERY CONSULTATION REPORT  Referring Provider is Minus Breeding, [name], [name], MD  Chief Complaint  Patient presents with   Mitral Regurgitation    ECHO 03/17/18/TEE 04/06/18    HPI:  Patient is a 68 year old male with history of hyperlipidemia and prostate cancer status post prostatectomy who has been referred for surgical consultation to discuss treatment options for management of recently discovered mitral valve prolapse with severe mitral regurgitation.  Patient states that he has been healthy all of his life.  He was recently noted to have a heart murmur on physical exam by his primary care physician.  An echocardiogram was performed documenting the presence of mitral valve prolapse with severe mitral regurgitation.  The patient was referred to Dr. Percival Spanish and underwent transesophageal echocardiogram April 06, 2018 confirming the presence of mitral valve prolapse with moderate to severe mitral regurgitation.  Cardiothoracic surgical consultation was requested.  The patient's initial consultation visit was postponed because of the ongoing COVID-19 pandemic.  He was telephone recently and requested face-to-face office visit because of increasing concerns relating to his underlying heart condition.  Patient is married and lives with his wife here in [place].  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  He has remained reasonably active physically all of his adult life although he does not exercise on a regular basis.  He does report symptoms of exertional shortness of breath that occur only with more strenuous physical exertion such as walking at a brisk pace or walking up a hill.  He has recently discovered mild tendency to get short of breath when he lays flat in  bed.  He now props himself up on 1 or 2 pillows.  He states that he occasionally wakes up at night feeling like he is gasping for his breath.  He has not had any chest pain or chest tightness.  He denies any lower extremity edema.  He reports no palpitations, dizzy spells, or syncope.  He denies any recent fevers or cough.  He has not been around any persons with known or suspected COVID-19 infection.  Past Medical History:  Diagnosis Date   Cancer [facility])    prostate.  skin basal cell   Decreased hearing    ED (erectile dysfunction)    GERD (gastroesophageal reflux disease)    Hyperlipidemia    IBS (irritable bowel syndrome)    MVP (mitral valve prolapse)    Nonrheumatic mitral valve regurgitation    Ruptured cervical disc 2008    Past Surgical History:  Procedure Laterality Date   ACHILLES TENDON SURGERY  1980   prosthesis   AMPUTATION FINGER / THUMB Right 12/2004   tip of thumb for strep infection   PROSTATECTOMY  2007   TEE WITHOUT CARDIOVERSION N/A 04/06/2018   Procedure: TRANSESOPHAGEAL ECHOCARDIOGRAM (TEE);  Surgeon: Sanda Klein, MD;  Location: [facility] ENDOSCOPY;  Service: Cardiovascular;  Laterality: N/A;    Family History  Problem Relation Age of Onset   Heart disease Mother        arrhythmia, angina   Heart disease Father 36       CAD, MVR   Hyperlipidemia Other     Social History   Socioeconomic History  Marital status: Married    Spouse name: Not on file   Number of children: Not on file   Years of education: Not on file   Highest education level: Not on file  Occupational History   Occupation: retired  Scientist, product/process development strain: Not on file   Food insecurity:    Worry: Not on file    Inability: Not on file   Transportation needs:    Medical: Not on file    Non-medical: Not on file  Tobacco Use   Smoking status: Never Smoker   Smokeless tobacco: Never Used  Substance and Sexual Activity   Alcohol use: No    Drug use: No   Sexual activity: Yes  Lifestyle   Physical activity:    Days per week: Not on file    Minutes per session: Not on file   Stress: Not on file  Relationships   Social connections:    Talks on phone: Not on file    Gets together: Not on file    Attends religious service: Not on file    Active member of club or organization: Not on file    Attends meetings of clubs or organizations: Not on file    Relationship status: Not on file   Intimate partner violence:    Fear of current or ex partner: Not on file    Emotionally abused: Not on file    Physically abused: Not on file    Forced sexual activity: Not on file  Other Topics Concern   Not on file  Social History Narrative   Lives with wife.  Two daughters.  Six grand.   Retired Engineer, manufacturing systems.      Current Outpatient Medications  Medication Sig Dispense Refill   chlorpheniramine (CHLOR-TRIMETON) 4 MG tablet Take 4 mg by mouth daily.     glycopyrrolate (ROBINUL) 2 MG tablet Take 1 tablet (2 mg total) by mouth daily. 90 tablet 3   lansoprazole (PREVACID) 30 MG capsule Take 1 capsule (30 mg total) by mouth daily. 90 capsule 3   lovastatin (MEVACOR) 20 MG tablet Take 1 tablet (20 mg total) by mouth daily. 90 tablet 3   No current facility-administered medications for this visit.     Allergies  Allergen Reactions   Hydrocodone Other (See Comments)    chills      Review of Systems:   General:  normal appetite, normal energy, no weight gain, no weight loss, no fever  Cardiac:  no chest pain with exertion, no chest pain at rest, + SOB with exertion, no resting SOB, ? Possible mild PND, + mild orthopnea, no palpitations, no arrhythmia, no atrial fibrillation, no LE edema, no dizzy spells, no syncope  Respiratory:  no shortness of breath, no home oxygen, no productive cough, no dry cough, no bronchitis, no wheezing, no hemoptysis, no asthma, no pain with inspiration or cough, no sleep apnea, no CPAP at  night  GI:   no difficulty swallowing, no reflux, no frequent heartburn, no hiatal hernia, no abdominal pain, no constipation, no diarrhea, no hematochezia, no hematemesis, no melena  GU:   no dysuria,  no frequency, no urinary tract infection, no hematuria, no enlarged prostate, no kidney stones, no kidney disease  Vascular:  no pain suggestive of claudication, no pain in feet, no leg cramps, no varicose veins, no DVT, no non-healing foot ulcer  Neuro:   no stroke, no TIA's, no seizures, no headaches, no temporary blindness one eye,  no slurred speech, no peripheral neuropathy, no chronic pain, no instability of gait, no memory/cognitive dysfunction  Musculoskeletal: no arthritis, no joint swelling, no myalgias, no difficulty walking, normal mobility   Skin:   no rash, no itching, no skin infections, no pressure sores or ulcerations  Psych:   no anxiety, no depression, no nervousness, no unusual recent stress  Eyes:   no blurry vision, no floaters, no recent vision changes, + wears glasses or contacts  ENT:   no hearing loss, no loose or painful teeth, no dentures, last saw dentist September 2019  Hematologic:  no easy bruising, no abnormal bleeding, no clotting disorder, no frequent epistaxis  Endocrine:  no diabetes, does not check CBG's at home     Physical Exam:   BP 132/78 (BP Location: Right Arm, Patient Position: Sitting, Cuff Size: Large) Comment: MANUALLY   Pulse 72    Temp 97.7 F (36.5 C) (Temporal)    Ht 5\' 9"  (1.753 m)    Wt 165 lb (74.8 kg)    SpO2 100% Comment: RA   BMI 24.37 kg/m   General:  Thin,  well-appearing  HEENT:  Unremarkable   Neck:   no JVD, no bruits, no adenopathy   Chest:   clear to auscultation, symmetrical breath sounds, no wheezes, no rhonchi   CV:   RRR, grade IV/VI holosystolic murmur   Abdomen:  soft, non-tender, no masses   Extremities:  warm, well-perfused, pulses palpable, no LE edema  Rectal/GU  Deferred  Neuro:   Grossly non-focal and symmetrical  throughout  Skin:   Clean and dry, no rashes, no breakdown   Diagnostic Tests:   ECHOCARDIOGRAM REPORT       Patient Name:   Brendan [name] Date of Exam: 03/17/2018 Medical Rec #:  [id number]        Height:       69.0 in Accession #:    [id number]       Weight:       171.0 lb Date of Birth:  May 16, 1950        BSA:          1.93 m Patient Age:    57 years         BP:           132/82 mmHg Patient Gender: M                HR:           73 bpm. Exam Location:  [address]    Procedure: 2D Echo, 3D Echo, Cardiac Doppler and Color Doppler  Indications:    R01.1 Murmur   History:        Patient has no prior history of Echocardiogram examinations.                 Signs/Symptoms: Murmur; Risk Factors: Family History of Coronary                 Artery Disease and Dyslipidemia.   Sonographer:    Deliah Boston RDCS Referring Phys: [place]    1. The left ventricle has normal systolic function of 14-43%. The cavity size was normal. Left ventricular diastolic Doppler parameters are consistent with pseudonormal Elevated left ventricular end-diastolic pressure.  2. The right ventricle has normal systolic function. The cavity was normal. There is no increase in right ventricular wall thickness.  3. Left atrial size was mildly dilated.  4. Right atrial size was  mildly dilated.  5. Severe mitral valve prolapse.  6. 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 There is moderate thickening. Mitral valve regurgitation is severe by color flow Doppler. The MR jet is anteriorly-directed.  7. The tricuspid valve is normal in structure.  8. The aortic valve is tricuspid There is mild thickening of the aortic valve.  9. The pulmonic valve was normal in structure. 10. There is bi-leaflet mitral valve prolapse posterior > anterior with anteriorly directed jet of mitral regurgitation that is severe. A TEE Is recommended.  FINDINGS  Left Ventricle:  The left ventricle has normal systolic function of 96-29%. The cavity size was normal. There is no increase in left ventricular wall thickness. Left ventricular diastolic Doppler parameters are consistent with pseudonormal (grade II)  Elevated left ventricular end-diastolic pressure Right Ventricle: The right ventricle has normal systolic function. The cavity was normal. There is no increase in right ventricular wall thickness. Left Atrium: left atrial size was mildly dilated Right Atrium: right atrial size was mildly dilated Interatrial Septum: No atrial level shunt detected by color flow Doppler. Pericardium: There is no evidence of pericardial effusion. Mitral Valve: The mitral valve is myxomatous. There is moderate thickening. Mitral valve regurgitation is severe by color flow Doppler. The MR jet is anteriorly-directed. There is severe holosystolic prolapse of of both mitral leaflets of the mitral  valve. Tricuspid Valve: The tricuspid valve is normal in structure. Tricuspid valve regurgitation is mild by color flow Doppler. Aortic Valve: The aortic valve is tricuspid There is mild thickening of the aortic valve. Aortic valve regurgitation was not visualized by color flow Doppler. Pulmonic Valve: The pulmonic valve was normal in structure. Pulmonic valve regurgitation is not visualized by color flow Doppler. Venous: The inferior vena cava is normal in size with greater than 50% respiratory variability. Additional Findings: There is bi-leaflet mitral valve prolapse posterior > anterior with anteriorly directed jet of mitral regurgitation that is severe. A TEE Is recommended.   LEFT VENTRICLE PLAX 2D (Teich) LV EF:          58.3 %   Diastology LVIDd:          4.90 cm  LV e' lateral:   11.70 cm/s LVIDs:          3.39 cm  LV E/e' lateral: 12.3 LV PW:          0.81 cm  LV e' medial:    13.10 cm/s LV IVS:         0.91 cm  LV E/e' medial:  11.0 LVOT diam:      2.00 cm LV SV:          66 ml LVOT  Area:      3.14 cm  RIGHT VENTRICLE RV S prime:     13.10 cm/s TAPSE (M-mode): 2.9 cm RVSP:           21.9 mmHg  LEFT ATRIUM             Index       RIGHT ATRIUM           Index LA diam:        3.70 cm 1.91 cm/m  RA Pressure: 3 mmHg LA Vol (A2C):   60.9 ml 31.51 ml/m RA Area:     18.60 cm LA Vol (A4C):   78.0 ml 40.35 ml/m RA Volume:   51.70 ml  26.75 ml/m LA Biplane Vol: 69.9 ml 36.16 ml/m  AORTIC VALVE LVOT Vmax:  115.00 cm/s LVOT Vmean:  65.450 cm/s LVOT VTI:    0.204 m   AORTA Ao Root diam: 3.35 cm Ao Asc diam:  3.00 cm  MITRAL VALVE               TR Peak grad: 18.9 mmHg MV Area (PHT): 3.06 cm    TR Vmax:      250.00 cm/s MV Peak grad:  8.9 mmHg    RVSP:         21.9 mmHg MV Mean grad:  3.0 mmHg MV Vmax:       1.49 m/s MV Vmean:      83.5 cm/s MV VTI:        0.36 m MV PHT:        72 msec MV Decel Time: 187 msec MR Peak grad: 78.9 mmHg MR Mean grad: 47.0 mmHg MR Vmax:      444.00 cm/s MR Vmean:     320.0 cm/s MR PISA:      12.32 MV E velocity: 144.00 cm/s MV A velocity: 109.00 cm/s MV E/A ratio:  1.32    Ena Dawley MD Electronically signed by Ena Dawley MD Signature Date/Time: 03/17/2018/5:04:46 PM       TRANSESOPHOGEAL ECHO REPORT    Patient Name:   Brendan [name] Date of Exam: 04/06/2018 Medical Rec #:  [id number]        Height:       69.0 in Accession #:    [id number]       Weight:       171.0 lb Date of Birth:  October 22, 1950        BSA:          1.93 m Patient Age:    64 years         BP:           110/53 mmHg Patient Gender: M                HR:           63 bpm. Exam Location:  Inpatient    Procedure: Transesophageal Echo  Indications:     MR, MVP   History:         Patient has prior history of Echocardiogram examinations, most                  recent 03/17/2018. Mitral Valve Disease and Mitral Valve                  Prolapse; Signs/Symptoms: Fever; Risk Factors: Dyslipidemia.   Sonographer:     Mikki Santee RDCS  (AE) Referring Phys:  [place] Diagnosing Phys: Sanda Klein MD     PROCEDURE: The patient developed no complications during the procedure.  IMPRESSIONS    1. The left ventricle has normal systolic function, with an ejection fraction of 60-65%. The cavity size was normal. Left ventricular diastolic parameters were normal. No evidence of left ventricular regional wall motion abnormalities.  2. 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 There is particularly severe prolapse of the lateral scallop of the posterior leaflet (P1), moderate prolapse of the middle scallop of the posterior leaflet (P2).  3. The right ventricle has normal systolic function. The wall thickness and cavity size was normal.  4. Right ventricular systolic pressure is normal with an estimated pressure of 26 mmHg.  5. Moderate to severe mitral valve regurgitation. The jet is eccentric, anteriorly and medially directed.  6. Trivial aortic  valve regurgitation.  7. The aortic root, ascending aorta, aortic arch and descending aorta are normal in size and structure.  FINDINGS  Left Ventricle: The left ventricle has normal systolic function, with an ejection fraction of 60-65%. The cavity size was normal. There is no increase in left ventricular wall thickness. Left ventricular diastolic parameters were normal. No evidence of  left ventricular regional wall motion abnormalities. Right Ventricle: The right ventricle has normal systolic function. The cavity size was normal. There is no increase in right ventricular wall thickness. Right ventricular systolic pressure is normal with an estimated pressure of 26.2 mmHg. Left Atrium: Left atrial size was normal in size. Right Atrium: Right atrial size was normal in size. Right atrial pressure is estimated at 3 mmHg. Interatrial Septum: No atrial level shunt detected by color flow Doppler. Pericardium: There is no evidence of pericardial effusion. Mitral  Valve: The mitral valve is myxomatous. Mild thickening of the mitral valve leaflet. Mitral valve regurgitation is moderate to severe by color flow Doppler. The MR jet is eccentric anteriorly and medially directed. No evidence of mitral valve  stenosis. There is severe mitral valve regurgitation by PISA measuring 8.34. There is severe prolapse of of both mitral leaflets of the mitral valve.There is particularly severe prolapse of the lateral scallop of the posterior leaflet (P1), moderately  severe prolapse of the middle scallop (P2). ERO area 0.34 cm sq, regurgitant volume 61 mL, regurgitant fraction 51% by PISA method. Regurgitant volume 40 mL by continuity equation. Vena contracta 5 mm. There is no flow reversal in either right or left  pulmonary veins. There is no mitral stenosis, the valve area is 6 cm sq. by planimetry. Tricuspid Valve: The tricuspid valve was normal in structure. Tricuspid valve regurgitation is mild by color flow Doppler. Aortic Valve: The aortic valve is normal in structure. Aortic valve regurgitation is trivial by color flow Doppler. Pulmonic Valve: The pulmonic valve was normal in structure. Pulmonic valve regurgitation is trivial by color flow Doppler. Aorta: The aortic root, ascending aorta and aortic arch are normal in size and structure.   LEFT VENTRICLE PLAX 2D (Teich) LV EF:          71.0 % LVIDd:          5.60 cm LVIDs:          3.31 cm LVOT diam:      2.05 cm LV SV:          109 ml LVOT Area:      3.30 cm  RIGHT VENTRICLE RVSP:           26.2 mmHg  RIGHT ATRIUM RA Pressure: 3 mmHg  AORTIC VALVE LVOT Vmax:   105.00 cm/s LVOT Vmean:  68.700 cm/s LVOT VTI:    0.183 m  MITRAL VALVE                    TRICUSPID VALVE MV Area (planimetry): 6.00 cm  TR Peak grad:   23.2 mmHg MV VTI:               0.17 m    TR Vmax:        241.00 cm/s MR Peak grad:      127.5 mmHg   RVSP:           26.2 mmHg MR Mean grad:      98.0 mmHg MR Vmax:           564.67  cm/s MR Vmean:  464.0 cm/s MR PISA:           ERO 0.34 cm2 MR Vena Contracta: 0.50 cm    Mihai Croitoru MD Electronically signed by Sanda Klein MD Signature Date/Time: 04/06/2018/2:04:31 PM     Impression:  Patient has mitral valve prolapse with stage D severe symptomatic primary mitral regurgitation.  He describes mild symptoms of exertional shortness of breath that occurs only with more strenuous physical exertion as well as possible mild orthopnea and PND, consistent with chronic diastolic congestive heart failure, New York Heart Association functional class I-II.  I personally reviewed the patient's recent transthoracic and transesophageal echocardiograms.  The patient has myxomatous degenerative disease of the mitral valve with a large flail segment involving a portion of the posterior leaflet of the mitral valve.  There is at least moderately severe mitral regurgitation.  Left ventricular size and systolic function remains normal.  Based upon review of the TEE I feel that the patient's valve can be repaired with greater than 95% confidence of an excellent and durable result.  Risks associated with surgery should be very low and the patient will likely be a good candidate for minimally invasive approach, although diagnostic cardiac catheterization will need to be performed prior to surgery.   Plan:  The patient and his wife were counseled at length regarding the indications, risks and potential benefits of mitral valve repair.  The rationale for elective surgery has been explained, including a comparison between surgery and continued medical therapy with close follow-up.  The likelihood of successful and durable valve repair has been discussed with particular reference to the findings of their recent echocardiogram.  Based upon these findings and previous experience, I have quoted them a greater than 95 percent likelihood of successful valve repair.  Alternative surgical approaches  have been discussed including a comparison between conventional sternotomy and minimally-invasive techniques.  Expectations for the patient's postoperative convalescence have been discussed.  We discussed the timing of elective surgery particularly with reference to ongoing restrictions related to the COVID-19 pandemic.  Under the circumstances I feel that there should be minimal risks associated with delaying plans for surgical intervention for at least another 2 to 4 weeks if not longer, but I agree that it would make sense to proceed with surgery at some point within the next few months.  All the patient's questions have been addressed.  The patient hopes to proceed with surgery as soon as practical.  We tentatively plan for elective mitral valve repair on July 08, 2018.  Prior to surgery the patient will need to undergo diagnostic cardiac catheterization.  We will also obtain CT angiography to evaluate the feasibility of peripheral cannulation for surgery.    I spent in excess of 90 minutes during the conduct of this office consultation and >50% of this time involved direct face-to-face encounter with the patient for counseling and/or coordination of their care.    Valentina Gu. Roxy Manns, MD 06/08/2018 12:26 PM

## 2018-06-08 NOTE — H&P (View-Only) (Signed)
[address]       [place] [address]             [phone]     CARDIOTHORACIC SURGERY CONSULTATION REPORT  Referring Provider is Minus Breeding, [name], [name], MD  Chief Complaint  Patient presents with   Mitral Regurgitation    ECHO 03/17/18/TEE 04/06/18    HPI:  Patient is a 68 year old male with history of hyperlipidemia and prostate cancer status post prostatectomy who has been referred for surgical consultation to discuss treatment options for management of recently discovered mitral valve prolapse with severe mitral regurgitation.  Patient states that he has been healthy all of his life.  He was recently noted to have a heart murmur on physical exam by his primary care physician.  An echocardiogram was performed documenting the presence of mitral valve prolapse with severe mitral regurgitation.  The patient was referred to Dr. Percival Spanish and underwent transesophageal echocardiogram April 06, 2018 confirming the presence of mitral valve prolapse with moderate to severe mitral regurgitation.  Cardiothoracic surgical consultation was requested.  The patient's initial consultation visit was postponed because of the ongoing COVID-19 pandemic.  He was telephone recently and requested face-to-face office visit because of increasing concerns relating to his underlying heart condition.  Patient is married and lives with his wife here in [place].  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  He has remained reasonably active physically all of his adult life although he does not exercise on a regular basis.  He does report symptoms of exertional shortness of breath that occur only with more strenuous physical exertion such as walking at a brisk pace or walking up a hill.  He has recently discovered mild tendency to get short of breath when he lays flat in  bed.  He now props himself up on 1 or 2 pillows.  He states that he occasionally wakes up at night feeling like he is gasping for his breath.  He has not had any chest pain or chest tightness.  He denies any lower extremity edema.  He reports no palpitations, dizzy spells, or syncope.  He denies any recent fevers or cough.  He has not been around any persons with known or suspected COVID-19 infection.  Past Medical History:  Diagnosis Date   Cancer [facility])    prostate.  skin basal cell   Decreased hearing    ED (erectile dysfunction)    GERD (gastroesophageal reflux disease)    Hyperlipidemia    IBS (irritable bowel syndrome)    MVP (mitral valve prolapse)    Nonrheumatic mitral valve regurgitation    Ruptured cervical disc 2008    Past Surgical History:  Procedure Laterality Date   ACHILLES TENDON SURGERY  1980   prosthesis   AMPUTATION FINGER / THUMB Right 12/2004   tip of thumb for strep infection   PROSTATECTOMY  2007   TEE WITHOUT CARDIOVERSION N/A 04/06/2018   Procedure: TRANSESOPHAGEAL ECHOCARDIOGRAM (TEE);  Surgeon: Sanda Klein, MD;  Location: [facility] ENDOSCOPY;  Service: Cardiovascular;  Laterality: N/A;    Family History  Problem Relation Age of Onset   Heart disease Mother        arrhythmia, angina   Heart disease Father 16       CAD, MVR   Hyperlipidemia Other     Social History   Socioeconomic History  Marital status: Married    Spouse name: Not on file   Number of children: Not on file   Years of education: Not on file   Highest education level: Not on file  Occupational History   Occupation: retired  Scientist, product/process development strain: Not on file   Food insecurity:    Worry: Not on file    Inability: Not on file   Transportation needs:    Medical: Not on file    Non-medical: Not on file  Tobacco Use   Smoking status: Never Smoker   Smokeless tobacco: Never Used  Substance and Sexual Activity   Alcohol use: No    Drug use: No   Sexual activity: Yes  Lifestyle   Physical activity:    Days per week: Not on file    Minutes per session: Not on file   Stress: Not on file  Relationships   Social connections:    Talks on phone: Not on file    Gets together: Not on file    Attends religious service: Not on file    Active member of club or organization: Not on file    Attends meetings of clubs or organizations: Not on file    Relationship status: Not on file   Intimate partner violence:    Fear of current or ex partner: Not on file    Emotionally abused: Not on file    Physically abused: Not on file    Forced sexual activity: Not on file  Other Topics Concern   Not on file  Social History Narrative   Lives with wife.  Two daughters.  Six grand.   Retired Engineer, manufacturing systems.      Current Outpatient Medications  Medication Sig Dispense Refill   chlorpheniramine (CHLOR-TRIMETON) 4 MG tablet Take 4 mg by mouth daily.     glycopyrrolate (ROBINUL) 2 MG tablet Take 1 tablet (2 mg total) by mouth daily. 90 tablet 3   lansoprazole (PREVACID) 30 MG capsule Take 1 capsule (30 mg total) by mouth daily. 90 capsule 3   lovastatin (MEVACOR) 20 MG tablet Take 1 tablet (20 mg total) by mouth daily. 90 tablet 3   No current facility-administered medications for this visit.     Allergies  Allergen Reactions   Hydrocodone Other (See Comments)    chills      Review of Systems:   General:  normal appetite, normal energy, no weight gain, no weight loss, no fever  Cardiac:  no chest pain with exertion, no chest pain at rest, + SOB with exertion, no resting SOB, ? Possible mild PND, + mild orthopnea, no palpitations, no arrhythmia, no atrial fibrillation, no LE edema, no dizzy spells, no syncope  Respiratory:  no shortness of breath, no home oxygen, no productive cough, no dry cough, no bronchitis, no wheezing, no hemoptysis, no asthma, no pain with inspiration or cough, no sleep apnea, no CPAP at  night  GI:   no difficulty swallowing, no reflux, no frequent heartburn, no hiatal hernia, no abdominal pain, no constipation, no diarrhea, no hematochezia, no hematemesis, no melena  GU:   no dysuria,  no frequency, no urinary tract infection, no hematuria, no enlarged prostate, no kidney stones, no kidney disease  Vascular:  no pain suggestive of claudication, no pain in feet, no leg cramps, no varicose veins, no DVT, no non-healing foot ulcer  Neuro:   no stroke, no TIA's, no seizures, no headaches, no temporary blindness one eye,  no slurred speech, no peripheral neuropathy, no chronic pain, no instability of gait, no memory/cognitive dysfunction  Musculoskeletal: no arthritis, no joint swelling, no myalgias, no difficulty walking, normal mobility   Skin:   no rash, no itching, no skin infections, no pressure sores or ulcerations  Psych:   no anxiety, no depression, no nervousness, no unusual recent stress  Eyes:   no blurry vision, no floaters, no recent vision changes, + wears glasses or contacts  ENT:   no hearing loss, no loose or painful teeth, no dentures, last saw dentist September 2019  Hematologic:  no easy bruising, no abnormal bleeding, no clotting disorder, no frequent epistaxis  Endocrine:  no diabetes, does not check CBG's at home     Physical Exam:   BP 132/78 (BP Location: Right Arm, Patient Position: Sitting, Cuff Size: Large) Comment: MANUALLY   Pulse 72    Temp 97.7 F (36.5 C) (Temporal)    Ht 5\' 9"  (1.753 m)    Wt 165 lb (74.8 kg)    SpO2 100% Comment: RA   BMI 24.37 kg/m   General:  Thin,  well-appearing  HEENT:  Unremarkable   Neck:   no JVD, no bruits, no adenopathy   Chest:   clear to auscultation, symmetrical breath sounds, no wheezes, no rhonchi   CV:   RRR, grade IV/VI holosystolic murmur   Abdomen:  soft, non-tender, no masses   Extremities:  warm, well-perfused, pulses palpable, no LE edema  Rectal/GU  Deferred  Neuro:   Grossly non-focal and symmetrical  throughout  Skin:   Clean and dry, no rashes, no breakdown   Diagnostic Tests:   ECHOCARDIOGRAM REPORT       Patient Name:   DAELEN BELVEDERE Date of Exam: 03/17/2018 Medical Rec #:  [id number]        Height:       69.0 in Accession #:    [id number]       Weight:       171.0 lb Date of Birth:  1950/06/04        BSA:          1.93 m Patient Age:    32 years         BP:           132/82 mmHg Patient Gender: M                HR:           73 bpm. Exam Location:  [address]    Procedure: 2D Echo, 3D Echo, Cardiac Doppler and Color Doppler  Indications:    R01.1 Murmur   History:        Patient has no prior history of Echocardiogram examinations.                 Signs/Symptoms: Murmur; Risk Factors: Family History of Coronary                 Artery Disease and Dyslipidemia.   Sonographer:    Deliah Boston RDCS Referring Phys: [place]    1. The left ventricle has normal systolic function of 12-45%. The cavity size was normal. Left ventricular diastolic Doppler parameters are consistent with pseudonormal Elevated left ventricular end-diastolic pressure.  2. The right ventricle has normal systolic function. The cavity was normal. There is no increase in right ventricular wall thickness.  3. Left atrial size was mildly dilated.  4. Right atrial size was  mildly dilated.  5. Severe mitral valve prolapse.  6. 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 There is moderate thickening. Mitral valve regurgitation is severe by color flow Doppler. The MR jet is anteriorly-directed.  7. The tricuspid valve is normal in structure.  8. The aortic valve is tricuspid There is mild thickening of the aortic valve.  9. The pulmonic valve was normal in structure. 10. There is bi-leaflet mitral valve prolapse posterior > anterior with anteriorly directed jet of mitral regurgitation that is severe. A TEE Is recommended.  FINDINGS  Left Ventricle:  The left ventricle has normal systolic function of 88-82%. The cavity size was normal. There is no increase in left ventricular wall thickness. Left ventricular diastolic Doppler parameters are consistent with pseudonormal (grade II)  Elevated left ventricular end-diastolic pressure Right Ventricle: The right ventricle has normal systolic function. The cavity was normal. There is no increase in right ventricular wall thickness. Left Atrium: left atrial size was mildly dilated Right Atrium: right atrial size was mildly dilated Interatrial Septum: No atrial level shunt detected by color flow Doppler. Pericardium: There is no evidence of pericardial effusion. Mitral Valve: The mitral valve is myxomatous. There is moderate thickening. Mitral valve regurgitation is severe by color flow Doppler. The MR jet is anteriorly-directed. There is severe holosystolic prolapse of of both mitral leaflets of the mitral  valve. Tricuspid Valve: The tricuspid valve is normal in structure. Tricuspid valve regurgitation is mild by color flow Doppler. Aortic Valve: The aortic valve is tricuspid There is mild thickening of the aortic valve. Aortic valve regurgitation was not visualized by color flow Doppler. Pulmonic Valve: The pulmonic valve was normal in structure. Pulmonic valve regurgitation is not visualized by color flow Doppler. Venous: The inferior vena cava is normal in size with greater than 50% respiratory variability. Additional Findings: There is bi-leaflet mitral valve prolapse posterior > anterior with anteriorly directed jet of mitral regurgitation that is severe. A TEE Is recommended.   LEFT VENTRICLE PLAX 2D (Teich) LV EF:          58.3 %   Diastology LVIDd:          4.90 cm  LV e' lateral:   11.70 cm/s LVIDs:          3.39 cm  LV E/e' lateral: 12.3 LV PW:          0.81 cm  LV e' medial:    13.10 cm/s LV IVS:         0.91 cm  LV E/e' medial:  11.0 LVOT diam:      2.00 cm LV SV:          66 ml LVOT  Area:      3.14 cm  RIGHT VENTRICLE RV S prime:     13.10 cm/s TAPSE (M-mode): 2.9 cm RVSP:           21.9 mmHg  LEFT ATRIUM             Index       RIGHT ATRIUM           Index LA diam:        3.70 cm 1.91 cm/m  RA Pressure: 3 mmHg LA Vol (A2C):   60.9 ml 31.51 ml/m RA Area:     18.60 cm LA Vol (A4C):   78.0 ml 40.35 ml/m RA Volume:   51.70 ml  26.75 ml/m LA Biplane Vol: 69.9 ml 36.16 ml/m  AORTIC VALVE LVOT Vmax:  115.00 cm/s LVOT Vmean:  65.450 cm/s LVOT VTI:    0.204 m   AORTA Ao Root diam: 3.35 cm Ao Asc diam:  3.00 cm  MITRAL VALVE               TR Peak grad: 18.9 mmHg MV Area (PHT): 3.06 cm    TR Vmax:      250.00 cm/s MV Peak grad:  8.9 mmHg    RVSP:         21.9 mmHg MV Mean grad:  3.0 mmHg MV Vmax:       1.49 m/s MV Vmean:      83.5 cm/s MV VTI:        0.36 m MV PHT:        72 msec MV Decel Time: 187 msec MR Peak grad: 78.9 mmHg MR Mean grad: 47.0 mmHg MR Vmax:      444.00 cm/s MR Vmean:     320.0 cm/s MR PISA:      12.32 MV E velocity: 144.00 cm/s MV A velocity: 109.00 cm/s MV E/A ratio:  1.32    Ena Dawley MD Electronically signed by Ena Dawley MD Signature Date/Time: 03/17/2018/5:04:46 PM       TRANSESOPHOGEAL ECHO REPORT    Patient Name:   [name] Date of Exam: 04/06/2018 Medical Rec #:  [id number]        Height:       69.0 in Accession #:    [id number]       Weight:       171.0 lb Date of Birth:  December 19, 1950        BSA:          1.93 m Patient Age:    71 years         BP:           110/53 mmHg Patient Gender: M                HR:           63 bpm. Exam Location:  Inpatient    Procedure: Transesophageal Echo  Indications:     MR, MVP   History:         Patient has prior history of Echocardiogram examinations, most                  recent 03/17/2018. Mitral Valve Disease and Mitral Valve                  Prolapse; Signs/Symptoms: Fever; Risk Factors: Dyslipidemia.   Sonographer:     Mikki Santee RDCS  (AE) Referring Phys:  [place] Diagnosing Phys: Sanda Klein MD     PROCEDURE: The patient developed no complications during the procedure.  IMPRESSIONS    1. The left ventricle has normal systolic function, with an ejection fraction of 60-65%. The cavity size was normal. Left ventricular diastolic parameters were normal. No evidence of left ventricular regional wall motion abnormalities.  2. 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 There is particularly severe prolapse of the lateral scallop of the posterior leaflet (P1), moderate prolapse of the middle scallop of the posterior leaflet (P2).  3. The right ventricle has normal systolic function. The wall thickness and cavity size was normal.  4. Right ventricular systolic pressure is normal with an estimated pressure of 26 mmHg.  5. Moderate to severe mitral valve regurgitation. The jet is eccentric, anteriorly and medially directed.  6. Trivial aortic  valve regurgitation.  7. The aortic root, ascending aorta, aortic arch and descending aorta are normal in size and structure.  FINDINGS  Left Ventricle: The left ventricle has normal systolic function, with an ejection fraction of 60-65%. The cavity size was normal. There is no increase in left ventricular wall thickness. Left ventricular diastolic parameters were normal. No evidence of  left ventricular regional wall motion abnormalities. Right Ventricle: The right ventricle has normal systolic function. The cavity size was normal. There is no increase in right ventricular wall thickness. Right ventricular systolic pressure is normal with an estimated pressure of 26.2 mmHg. Left Atrium: Left atrial size was normal in size. Right Atrium: Right atrial size was normal in size. Right atrial pressure is estimated at 3 mmHg. Interatrial Septum: No atrial level shunt detected by color flow Doppler. Pericardium: There is no evidence of pericardial effusion. Mitral  Valve: The mitral valve is myxomatous. Mild thickening of the mitral valve leaflet. Mitral valve regurgitation is moderate to severe by color flow Doppler. The MR jet is eccentric anteriorly and medially directed. No evidence of mitral valve  stenosis. There is severe mitral valve regurgitation by PISA measuring 8.34. There is severe prolapse of of both mitral leaflets of the mitral valve.There is particularly severe prolapse of the lateral scallop of the posterior leaflet (P1), moderately  severe prolapse of the middle scallop (P2). ERO area 0.34 cm sq, regurgitant volume 61 mL, regurgitant fraction 51% by PISA method. Regurgitant volume 40 mL by continuity equation. Vena contracta 5 mm. There is no flow reversal in either right or left  pulmonary veins. There is no mitral stenosis, the valve area is 6 cm sq. by planimetry. Tricuspid Valve: The tricuspid valve was normal in structure. Tricuspid valve regurgitation is mild by color flow Doppler. Aortic Valve: The aortic valve is normal in structure. Aortic valve regurgitation is trivial by color flow Doppler. Pulmonic Valve: The pulmonic valve was normal in structure. Pulmonic valve regurgitation is trivial by color flow Doppler. Aorta: The aortic root, ascending aorta and aortic arch are normal in size and structure.   LEFT VENTRICLE PLAX 2D (Teich) LV EF:          71.0 % LVIDd:          5.60 cm LVIDs:          3.31 cm LVOT diam:      2.05 cm LV SV:          109 ml LVOT Area:      3.30 cm  RIGHT VENTRICLE RVSP:           26.2 mmHg  RIGHT ATRIUM RA Pressure: 3 mmHg  AORTIC VALVE LVOT Vmax:   105.00 cm/s LVOT Vmean:  68.700 cm/s LVOT VTI:    0.183 m  MITRAL VALVE                    TRICUSPID VALVE MV Area (planimetry): 6.00 cm  TR Peak grad:   23.2 mmHg MV VTI:               0.17 m    TR Vmax:        241.00 cm/s MR Peak grad:      127.5 mmHg   RVSP:           26.2 mmHg MR Mean grad:      98.0 mmHg MR Vmax:           564.67  cm/s MR Vmean:  464.0 cm/s MR PISA:           ERO 0.34 cm2 MR Vena Contracta: 0.50 cm    Mihai Croitoru MD Electronically signed by Sanda Klein MD Signature Date/Time: 04/06/2018/2:04:31 PM     Impression:  Patient has mitral valve prolapse with stage D severe symptomatic primary mitral regurgitation.  He describes mild symptoms of exertional shortness of breath that occurs only with more strenuous physical exertion as well as possible mild orthopnea and PND, consistent with chronic diastolic congestive heart failure, New York Heart Association functional class I-II.  I personally reviewed the patient's recent transthoracic and transesophageal echocardiograms.  The patient has myxomatous degenerative disease of the mitral valve with a large flail segment involving a portion of the posterior leaflet of the mitral valve.  There is at least moderately severe mitral regurgitation.  Left ventricular size and systolic function remains normal.  Based upon review of the TEE I feel that the patient's valve can be repaired with greater than 95% confidence of an excellent and durable result.  Risks associated with surgery should be very low and the patient will likely be a good candidate for minimally invasive approach, although diagnostic cardiac catheterization will need to be performed prior to surgery.   Plan:  The patient and his wife were counseled at length regarding the indications, risks and potential benefits of mitral valve repair.  The rationale for elective surgery has been explained, including a comparison between surgery and continued medical therapy with close follow-up.  The likelihood of successful and durable valve repair has been discussed with particular reference to the findings of their recent echocardiogram.  Based upon these findings and previous experience, I have quoted them a greater than 95 percent likelihood of successful valve repair.  Alternative surgical approaches  have been discussed including a comparison between conventional sternotomy and minimally-invasive techniques.  Expectations for the patient's postoperative convalescence have been discussed.  We discussed the timing of elective surgery particularly with reference to ongoing restrictions related to the COVID-19 pandemic.  Under the circumstances I feel that there should be minimal risks associated with delaying plans for surgical intervention for at least another 2 to 4 weeks if not longer, but I agree that it would make sense to proceed with surgery at some point within the next few months.  All the patient's questions have been addressed.  The patient hopes to proceed with surgery as soon as practical.  We tentatively plan for elective mitral valve repair on July 08, 2018.  Prior to surgery the patient will need to undergo diagnostic cardiac catheterization.  We will also obtain CT angiography to evaluate the feasibility of peripheral cannulation for surgery.    I spent in excess of 90 minutes during the conduct of this office consultation and >50% of this time involved direct face-to-face encounter with the patient for counseling and/or coordination of their care.    Valentina Gu. Roxy Manns, MD 06/08/2018 12:26 PM

## 2018-06-10 ENCOUNTER — Telehealth: Payer: Self-pay

## 2018-06-10 NOTE — Telephone Encounter (Signed)
-----   Message from Sherren Mocha, MD sent at 06/09/2018  9:47 PM EDT ----- Regarding: R/L heart cath Katy: can you set him up for a R/L heart cath with me when we are able to do this? I think after 5/11 should be fine.  ----- Message ----- From: [name], MD Sent: 06/08/2018   1:17 PM EDT To: Minus Breeding, MD, Sherren Mocha, MD  Mr Chapdelaine is anxious to proceed with mitral repair in the near future.  We are tentatively planning surgery for June 3rd pending restrictions related to the [place] pandemic.  He will need cath sometime before the end of the month if possible

## 2018-06-10 NOTE — Telephone Encounter (Signed)
Scheduled the patient for [facility] 5/13 with Dr. Burt Knack. Instructions reviewed and released to MyChart. He was grateful for assistance.

## 2018-06-15 ENCOUNTER — Other Ambulatory Visit: Payer: Self-pay

## 2018-06-15 ENCOUNTER — Other Ambulatory Visit (HOSPITAL_COMMUNITY)
Admission: RE | Admit: 2018-06-15 | Discharge: 2018-06-15 | Disposition: A | Discharge status: Home / Self Care | Payer: Medicare Other | Source: Ambulatory Visit | Attending: Cardiovascular Disease | Admitting: Cardiovascular Disease

## 2018-06-15 DIAGNOSIS — Z1159 Encounter for screening for other viral diseases: Secondary | ICD-10-CM | POA: Diagnosis not present

## 2018-06-16 ENCOUNTER — Telehealth: Payer: Self-pay | Admitting: *Deleted

## 2018-06-16 LAB — NOVEL CORONAVIRUS, NAA (HOSP ORDER, SEND-OUT TO REF LAB; TAT 18-24 HRS): SARS-CoV-2, NAA: NOT DETECTED

## 2018-06-16 NOTE — Telephone Encounter (Signed)
Pt contacted pre-catheterization scheduled at [facility] for: Wednesday Jun 17, 2018 8:30 AM Verified arrival time and place: [facility] A at: 6:30 AM  No solid food after midnight prior to cath, clear liquids until 5 AM day of procedure. Contrast allergy: no   AM meds can be  taken pre-cath with sip of water including: ASA 81 mg   Confirmed patient has responsible person to drive home post procedure and observe 24 hours after arriving home yes  06/15/18 Covid 19-not detected-pt aware      COVID-19 Pre-Screening Questions:  . In the past 7 to 10 days have you had a cough,  shortness of breath, headache, congestion, fever, body aches, chills, sore throat, or sudden loss of taste or sense of smell? no . Have you been around anyone with known Covid 19. no . Have you been around anyone who is awaiting Covid 19 test results in the past 7 to 10 days? no . Have you been around anyone who has been exposed to Covid 19, or has mentioned symptoms of Covid 19 within the past 7 to 10 days? no  Pt advised due to Covid-19 pandemic, [facility] is restricting visitors and only patients should present for check-in prior to their procedure. People will not be allowed to enter [facility] with the patient. At this time [facility] is not allowing visitors to all [facility] campuses.   I reviewed procedure instructions, visitor restrictions, Covid-19 screening questions with patient, he verbalized understanding.

## 2018-06-17 ENCOUNTER — Encounter (HOSPITAL_COMMUNITY)
Admission: RE | Disposition: A | Discharge status: Home / Self Care | Payer: Self-pay | Source: Home / Self Care | Attending: Cardiovascular Disease

## 2018-06-17 ENCOUNTER — Ambulatory Visit (HOSPITAL_COMMUNITY)
Admission: RE | Admit: 2018-06-17 | Discharge: 2018-06-17 | Disposition: A | Discharge status: Home / Self Care | Payer: Medicare Other | Attending: Cardiovascular Disease | Admitting: Cardiovascular Disease

## 2018-06-17 ENCOUNTER — Encounter (HOSPITAL_COMMUNITY): Payer: Self-pay | Admitting: Cardiovascular Disease

## 2018-06-17 ENCOUNTER — Other Ambulatory Visit: Payer: Self-pay

## 2018-06-17 DIAGNOSIS — Z79899 Other long term (current) drug therapy: Secondary | ICD-10-CM | POA: Insufficient documentation

## 2018-06-17 DIAGNOSIS — R0602 Shortness of breath: Secondary | ICD-10-CM | POA: Diagnosis not present

## 2018-06-17 DIAGNOSIS — K589 Irritable bowel syndrome without diarrhea: Secondary | ICD-10-CM | POA: Diagnosis not present

## 2018-06-17 DIAGNOSIS — Z8249 Family history of ischemic heart disease and other diseases of the circulatory system: Secondary | ICD-10-CM | POA: Diagnosis not present

## 2018-06-17 DIAGNOSIS — Z885 Allergy status to narcotic agent status: Secondary | ICD-10-CM | POA: Insufficient documentation

## 2018-06-17 DIAGNOSIS — I340 Nonrheumatic mitral (valve) insufficiency: Secondary | ICD-10-CM | POA: Diagnosis present

## 2018-06-17 DIAGNOSIS — K219 Gastro-esophageal reflux disease without esophagitis: Secondary | ICD-10-CM | POA: Insufficient documentation

## 2018-06-17 DIAGNOSIS — Z9079 Acquired absence of other genital organ(s): Secondary | ICD-10-CM | POA: Insufficient documentation

## 2018-06-17 DIAGNOSIS — E785 Hyperlipidemia, unspecified: Secondary | ICD-10-CM | POA: Insufficient documentation

## 2018-06-17 DIAGNOSIS — Z89011 Acquired absence of right thumb: Secondary | ICD-10-CM | POA: Diagnosis not present

## 2018-06-17 HISTORY — PX: RIGHT/LEFT HEART CATH AND CORONARY ANGIOGRAPHY: CATH118266

## 2018-06-17 LAB — POCT I-STAT 7, (LYTES, BLD GAS, ICA,H+H)
Acid-Base Excess: 1 mmol/L (ref 0.0–2.0)
Bicarbonate: 27.2 mmol/L (ref 20.0–28.0)
Calcium, Ion: 1.22 mmol/L (ref 1.15–1.40)
HCT: 38 % — ABNORMAL LOW (ref 39.0–52.0)
Hemoglobin: 12.9 g/dL — ABNORMAL LOW (ref 13.0–17.0)
O2 Saturation: 100 %
Potassium: 3.8 mmol/L (ref 3.5–5.1)
Sodium: 143 mmol/L (ref 135–145)
TCO2: 29 mmol/L (ref 22–32)
pCO2 arterial: 48.9 mmHg — ABNORMAL HIGH (ref 32.0–48.0)
pH, Arterial: 7.353 (ref 7.350–7.450)
pO2, Arterial: 197 mmHg — ABNORMAL HIGH (ref 83.0–108.0)

## 2018-06-17 LAB — CBC
HCT: 44.4 % (ref 39.0–52.0)
Hemoglobin: 14.7 g/dL (ref 13.0–17.0)
MCH: 29.6 pg (ref 26.0–34.0)
MCHC: 33.1 g/dL (ref 30.0–36.0)
MCV: 89.5 fL (ref 80.0–100.0)
Platelets: 154 K/uL (ref 150–400)
RBC: 4.96 MIL/uL (ref 4.22–5.81)
RDW: 12.7 % (ref 11.5–15.5)
WBC: 4.7 K/uL (ref 4.0–10.5)
nRBC: 0 % (ref 0.0–0.2)

## 2018-06-17 LAB — BASIC METABOLIC PANEL
Anion gap: 10 (ref 5–15)
BUN: 16 mg/dL (ref 8–23)
CO2: 29 mmol/L (ref 22–32)
Calcium: 9.4 mg/dL (ref 8.9–10.3)
Chloride: 104 mmol/L (ref 98–111)
Creatinine, Ser: 1.1 mg/dL (ref 0.61–1.24)
GFR calc Af Amer: >60 mL/min (ref >60)
GFR calc non Af Amer: >60 mL/min (ref >60)
Glucose, Bld: 96 mg/dL (ref 70–99)
Potassium: 3.9 mmol/L (ref 3.5–5.1)
Sodium: 143 mmol/L (ref 135–145)

## 2018-06-17 LAB — POCT I-STAT EG7
Bicarbonate: 26.6 mmol/L (ref 20.0–28.0)
Calcium, Ion: 1.18 mmol/L (ref 1.15–1.40)
HCT: 38 % — ABNORMAL LOW (ref 39.0–52.0)
Hemoglobin: 12.9 g/dL — ABNORMAL LOW (ref 13.0–17.0)
O2 Saturation: 79 %
Potassium: 3.8 mmol/L (ref 3.5–5.1)
Sodium: 144 mmol/L (ref 135–145)
TCO2: 28 mmol/L (ref 22–32)
pCO2, Ven: 51.1 mmHg (ref 44.0–60.0)
pH, Ven: 7.325 (ref 7.250–7.430)
pO2, Ven: 47 mmHg — ABNORMAL HIGH (ref 32.0–45.0)

## 2018-06-17 SURGERY — RIGHT/LEFT HEART CATH AND CORONARY ANGIOGRAPHY
Anesthesia: LOCAL

## 2018-06-17 MED ORDER — HEPARIN (PORCINE) IN NACL 1000-0.9 UT/500ML-% IV SOLN
INTRAVENOUS | Status: AC
  Filled 2018-06-17: qty 500

## 2018-06-17 MED ORDER — ASPIRIN 81 MG PO CHEW: 81.0000 mg | CHEWABLE_TABLET | ORAL | Status: DC

## 2018-06-17 MED ORDER — SODIUM CHLORIDE 0.9 % IV SOLN: 250.0000 mL | INTRAVENOUS | Status: DC | PRN

## 2018-06-17 MED ORDER — SODIUM CHLORIDE 0.9% FLUSH: 3.0000 mL | INTRAVENOUS | Status: DC | PRN

## 2018-06-17 MED ORDER — SODIUM CHLORIDE 0.9% FLUSH: 3.0000 mL | Freq: Two times a day (BID) | INTRAVENOUS | Status: DC

## 2018-06-17 MED ORDER — SODIUM CHLORIDE 0.9 % IV SOLN: INTRAVENOUS | Status: DC

## 2018-06-17 MED ORDER — LIDOCAINE HCL (PF) 1 % IJ SOLN
INTRAMUSCULAR | Status: AC
  Filled 2018-06-17: qty 30

## 2018-06-17 MED ORDER — HEPARIN (PORCINE) IN NACL 1000-0.9 UT/500ML-% IV SOLN
INTRAVENOUS | Status: DC | PRN
  Administered 2018-06-17 (×2): 500 mL

## 2018-06-17 MED ORDER — SODIUM CHLORIDE 0.9 % WEIGHT BASED INFUSION
3.0000 mL/kg/h | INTRAVENOUS | Status: AC
  Administered 2018-06-17: 3 mL/kg/h via INTRAVENOUS

## 2018-06-17 MED ORDER — MIDAZOLAM HCL 2 MG/2ML IJ SOLN
INTRAMUSCULAR | Status: AC
  Filled 2018-06-17: qty 2

## 2018-06-17 MED ORDER — VERAPAMIL HCL 2.5 MG/ML IV SOLN
INTRAVENOUS | Status: AC
  Filled 2018-06-17: qty 2

## 2018-06-17 MED ORDER — FENTANYL CITRATE (PF) 100 MCG/2ML IJ SOLN
INTRAMUSCULAR | Status: DC | PRN
  Administered 2018-06-17: 25 ug via INTRAVENOUS

## 2018-06-17 MED ORDER — LIDOCAINE HCL (PF) 1 % IJ SOLN
INTRAMUSCULAR | Status: DC | PRN
  Administered 2018-06-17 (×2): 2 mL

## 2018-06-17 MED ORDER — LABETALOL HCL 5 MG/ML IV SOLN: 10.0000 mg | INTRAVENOUS | Status: DC | PRN

## 2018-06-17 MED ORDER — SODIUM CHLORIDE 0.9 % WEIGHT BASED INFUSION: 1.0000 mL/kg/h | INTRAVENOUS | Status: DC

## 2018-06-17 MED ORDER — ACETAMINOPHEN 325 MG PO TABS: 650.0000 mg | ORAL_TABLET | ORAL | Status: DC | PRN

## 2018-06-17 MED ORDER — FENTANYL CITRATE (PF) 100 MCG/2ML IJ SOLN
INTRAMUSCULAR | Status: AC
  Filled 2018-06-17: qty 2

## 2018-06-17 MED ORDER — ONDANSETRON HCL 4 MG/2ML IJ SOLN: 4.0000 mg | Freq: Four times a day (QID) | INTRAMUSCULAR | Status: DC | PRN

## 2018-06-17 MED ORDER — VERAPAMIL HCL 2.5 MG/ML IV SOLN
INTRAVENOUS | Status: DC | PRN
  Administered 2018-06-17: 10 mL via INTRA_ARTERIAL

## 2018-06-17 MED ORDER — HYDRALAZINE HCL 20 MG/ML IJ SOLN: 10.0000 mg | INTRAMUSCULAR | Status: DC | PRN

## 2018-06-17 MED ORDER — IOHEXOL 350 MG/ML SOLN
INTRAVENOUS | Status: DC | PRN
  Administered 2018-06-17: 40 mL via INTRAVENOUS

## 2018-06-17 MED ORDER — MIDAZOLAM HCL 2 MG/2ML IJ SOLN
INTRAMUSCULAR | Status: DC | PRN
  Administered 2018-06-17: 2 mg via INTRAVENOUS

## 2018-06-17 MED ORDER — HEPARIN SODIUM (PORCINE) 1000 UNIT/ML IJ SOLN
INTRAMUSCULAR | Status: DC | PRN
  Administered 2018-06-17: 3500 [IU] via INTRAVENOUS

## 2018-06-17 MED ORDER — HEPARIN SODIUM (PORCINE) 1000 UNIT/ML IJ SOLN
INTRAMUSCULAR | Status: AC
  Filled 2018-06-17: qty 1

## 2018-06-17 SURGICAL SUPPLY — 11 items
CATH 5FR JL3.5 JR4 ANG PIG MP (CATHETERS) ×2 IMPLANT
CATH BALLN WEDGE 5F 110CM (CATHETERS) ×2 IMPLANT
DEVICE RAD COMP TR BAND LRG (VASCULAR PRODUCTS) ×2 IMPLANT
GLIDESHEATH SLEND SS 6F .021 (SHEATH) ×2 IMPLANT
GUIDEWIRE INQWIRE 1.5J.035X260 (WIRE) ×1 IMPLANT
INQWIRE 1.5J .035X260CM (WIRE) ×2
KIT HEART LEFT (KITS) ×2 IMPLANT
PACK CARDIAC CATHETERIZATION (CUSTOM PROCEDURE TRAY) ×2 IMPLANT
SHEATH GLIDE SLENDER 4/5FR (SHEATH) ×2 IMPLANT
TRANSDUCER W/STOPCOCK (MISCELLANEOUS) ×4 IMPLANT
TUBING CIL FLEX 10 FLL-RA (TUBING) ×2 IMPLANT

## 2018-06-17 NOTE — Discharge Instructions (Signed)
Radial Site Care ° °This sheet gives you information about how to care for yourself after your procedure. Your health care provider may also give you more specific instructions. If you have problems or questions, contact your health care provider. °What can I expect after the procedure? °After the procedure, it is common to have: °· Bruising and tenderness at the catheter insertion area. °Follow these instructions at home: °Medicines °· Take over-the-counter and prescription medicines only as told by your health care provider. °Insertion site care °· Follow instructions from your health care provider about how to take care of your insertion site. Make sure you: °? Wash your hands with soap and water before you change your bandage (dressing). If soap and water are not available, use hand sanitizer. °? Change your dressing as told by your health care provider. °? Leave stitches (sutures), skin glue, or adhesive strips in place. These skin closures may need to stay in place for 2 weeks or longer. If adhesive strip edges start to loosen and curl up, you may trim the loose edges. Do not remove adhesive strips completely unless your health care provider tells you to do that. °· Check your insertion site every day for signs of infection. Check for: °? Redness, swelling, or pain. °? Fluid or blood. °? Pus or a bad smell. °? Warmth. °· Do not take baths, swim, or use a hot tub until your health care provider approves. °· You may shower 24-48 hours after the procedure, or as directed by your health care provider. °? Remove the dressing and gently wash the site with plain soap and water. °? Pat the area dry with a clean towel. °? Do not rub the site. That could cause bleeding. °· Do not apply powder or lotion to the site. °Activity ° °· For 24 hours after the procedure, or as directed by your health care provider: °? Do not flex or bend the affected arm. °? Do not push or pull heavy objects with the affected arm. °? Do not  drive yourself home from the hospital or clinic. You may drive 24 hours after the procedure unless your health care provider tells you not to. °? Do not operate machinery or power tools. °· Do not lift anything that is heavier than 10 lb (4.5 kg), or the limit that you are told, until your health care provider says that it is safe. °· Ask your health care provider when it is okay to: °? Return to work or school. °? Resume usual physical activities or sports. °? Resume sexual activity. °General instructions °· If the catheter site starts to bleed, raise your arm and put firm pressure on the site. If the bleeding does not stop, get help right away. This is a medical emergency. °· If you went home on the same day as your procedure, a responsible adult should be with you for the first 24 hours after you arrive home. °· Keep all follow-up visits as told by your health care provider. This is important. °Contact a health care provider if: °· You have a fever. °· You have redness, swelling, or yellow drainage around your insertion site. °Get help right away if: °· You have unusual pain at the radial site. °· The catheter insertion area swells very fast. °· The insertion area is bleeding, and the bleeding does not stop when you hold steady pressure on the area. °· Your arm or hand becomes pale, cool, tingly, or numb. °These symptoms may represent a serious problem   that is an emergency. Do not wait to see if the symptoms will go away. Get medical help right away. Call your local emergency services (911 in the [place] Do not drive yourself to the hospital. °Summary °· After the procedure, it is common to have bruising and tenderness at the site. °· Follow instructions from your health care provider about how to take care of your radial site wound. Check the wound every day for signs of infection. °· Do not lift anything that is heavier than 10 lb (4.5 kg), or the limit that you are told, until your health care provider says  that it is safe. °This information is not intended to replace advice given to you by your health care provider. Make sure you discuss any questions you have with your health care provider. °Document Released: 02/23/2010 Document Revised: 02/26/2017 Document Reviewed: 02/26/2017 °Elsevier Interactive Patient Education © 2019 [employer] ° °

## 2018-06-17 NOTE — Interval H&P Note (Signed)
History and Physical Interval Note:  06/17/2018 3:95 AM  Brendan [name]  has presented today for surgery, with the diagnosis of chest pain.  The various methods of treatment have been discussed with the patient and family. After consideration of risks, benefits and other options for treatment, the patient has consented to  Procedure(s): RIGHT/LEFT HEART CATH AND CORONARY ANGIOGRAPHY (N/A) as a surgical intervention.  The patient's history has been reviewed, patient examined, no change in status, stable for surgery.  I have reviewed the patient's chart and labs.  Questions were answered to the patient's satisfaction.     Sherren Mocha

## 2018-06-19 ENCOUNTER — Ambulatory Visit: Payer: Medicare Other | Admitting: Cardiology

## 2018-06-25 ENCOUNTER — Encounter: Payer: Self-pay | Admitting: Internal Medicine

## 2018-06-25 ENCOUNTER — Ambulatory Visit (INDEPENDENT_AMBULATORY_CARE_PROVIDER_SITE_OTHER): Payer: Medicare Other | Admitting: Internal Medicine

## 2018-06-25 DIAGNOSIS — S80869A Insect bite (nonvenomous), unspecified lower leg, initial encounter: Secondary | ICD-10-CM | POA: Diagnosis not present

## 2018-06-25 DIAGNOSIS — W57XXXA Bitten or stung by nonvenomous insect and other nonvenomous arthropods, initial encounter: Secondary | ICD-10-CM | POA: Insufficient documentation

## 2018-06-25 MED ORDER — TRIAMCINOLONE ACETONIDE 0.5 % EX CREA: 1.0000 "application " | TOPICAL_CREAM | Freq: Four times a day (QID) | CUTANEOUS | 0 refills | Status: DC

## 2018-06-25 MED ORDER — DOXYCYCLINE HYCLATE 100 MG PO TABS: 100.0000 mg | ORAL_TABLET | Freq: Two times a day (BID) | ORAL | 0 refills | Status: DC

## 2018-06-25 NOTE — Progress Notes (Signed)
Virtual Visit via Video Note  I connected with Wood Novacek Fong on 26/94/85 at 11:20 AM EDT by a video enabled telemedicine application and verified that I am speaking with the correct person using two identifiers.   I discussed the limitations of evaluation and management by telemedicine and the availability of in person appointments. The patient expressed understanding and agreed to proceed.  History of Present Illness: Juanda Crumble is complaining of a tick bite to the back of his leg that she got on Tuesday.  There is a red spot measuring about 5 or 6 cm in diameter which is itching.  There has been no runny nose, cough, abdominal pain, diarrhea, constipation, arthralgias, skin rashes.   Observations/Objective: The patient appears to be in no acute distress, looks well.  Assessment and Plan:  See my Assessment and Plan. Follow Up Instructions:    I discussed the assessment and treatment plan with the patient. The patient was provided an opportunity to ask questions and all were answered. The patient agreed with the plan and demonstrated an understanding of the instructions.   The patient was advised to call back or seek an in-person evaluation if the symptoms worsen or if the condition fails to improve as anticipated.  I provided face-to-face time during this encounter. We were at different locations.   Walker Kehr, MD

## 2018-06-25 NOTE — Assessment & Plan Note (Signed)
The situation is discussed with Juanda Crumble.  In the view of his upcoming heart surgery on June 3, will treat him empirically with doxycycline.  Triamcinolone cream to use topically.  Call if problems

## 2018-07-02 DIAGNOSIS — R911 Solitary pulmonary nodule: Secondary | ICD-10-CM

## 2018-07-02 HISTORY — DX: Solitary pulmonary nodule: R91.1

## 2018-07-02 NOTE — Pre-Procedure Instructions (Signed)
Damiano Stamper Heminger  [birth year]     [facility] - [place], [place] - [address] [place] [address] Phone: [phone] Fax: [phone]   Your procedure is scheduled on Wednesday, June 3rd  Report to [facility] Entrance A at 6:30 A.M.  Call this number if you have problems the morning of surgery:  [phone]   Remember:  Do not eat or drink after midnight.     Take these medicines the morning of surgery with A SIP OF WATER    lansoprazole (PREVACID)  If needed - acetaminophen (TYLENOL)   Follow your surgeon's instructions on when to stop Aspirin.  If no instructions were given by your surgeon then you will need to call the office to get those instructions.     7 days prior to surgery STOP taking any Aspirin (unless otherwise instructed by your surgeon), Aleve, Naproxen, Ibuprofen, Motrin, Advil, Goody's, BC's, all herbal medications, fish oil, and all vitamins.    Do not wear jewelry, make-up or nail polish.  Do not wear lotions, powders, or perfumes, or deodorant.  Do not shave 48 hours prior to surgery.  Men may shave face and neck.  Do not bring valuables to the hospital.  [facility] is not responsible for any belongings or valuables.  Contacts, dentures or bridgework may not be worn into surgery.  Leave your suitcase in the car.  After surgery it may be brought to your room.  For patients admitted to the hospital, discharge time will be determined by your treatment team.  Patients discharged the day of surgery will not be allowed to drive home.   Special instructions:  [place]- Preparing For Surgery  Before surgery, you can play an important role. Because skin is not sterile, your skin needs to be as free of germs as possible. You can reduce the number of germs on your skin by washing with CHG (chlorahexidine gluconate) Soap before surgery.  CHG is an antiseptic cleaner which kills germs and bonds with the skin to  continue killing germs even after washing.    Oral Hygiene is also important to reduce your risk of infection.  Remember - BRUSH YOUR TEETH THE MORNING OF SURGERY WITH YOUR REGULAR TOOTHPASTE  Please do not use if you have an allergy to CHG or antibacterial soaps. If your skin becomes reddened/irritated stop using the CHG.  Do not shave (including legs and underarms) for at least 48 hours prior to first CHG shower. It is OK to shave your face.  Please follow these instructions carefully.   1. Shower the NIGHT BEFORE SURGERY and the MORNING OF SURGERY with CHG.   2. If you chose to wash your hair, wash your hair first as usual with your normal shampoo.  3. After you shampoo, rinse your hair and body thoroughly to remove the shampoo.  4. Use CHG as you would any other liquid soap. You can apply CHG directly to the skin and wash gently with a scrungie or a clean washcloth.   5. Apply the CHG Soap to your body ONLY FROM THE NECK DOWN.  Do not use on open wounds or open sores. Avoid contact with your eyes, ears, mouth and genitals (private parts). Wash Face and genitals (private parts)  with your normal soap.  6. Wash thoroughly, paying special attention to the area where your surgery will be performed.  7. Thoroughly rinse your body with warm water from the neck down.  8. DO NOT shower/wash with your normal soap after using and rinsing off the CHG Soap.  9. Pat yourself dry with a CLEAN TOWEL.  10. Wear CLEAN PAJAMAS to bed the night before surgery, wear comfortable clothes the morning of surgery  11. Place CLEAN SHEETS on your bed the night of your first shower and DO NOT SLEEP WITH PETS.  Day of Surgery:  Do not apply any deodorants/lotions.  Please wear clean clothes to the hospital/surgery center.   Remember to brush your teeth WITH YOUR REGULAR TOOTHPASTE.  Please read over the following fact sheets that you were given. Pain Booklet, Coughing and Deep Breathing, MRSA  Information and Surgical Site Infection Prevention

## 2018-07-03 ENCOUNTER — Ambulatory Visit (HOSPITAL_COMMUNITY)
Admission: RE | Admit: 2018-07-03 | Discharge: 2018-07-03 | Disposition: A | Discharge status: Home / Self Care | Payer: Medicare Other | Source: Ambulatory Visit | Attending: Thoracic Surgery (Cardiothoracic Vascular Surgery) | Admitting: Thoracic Surgery (Cardiothoracic Vascular Surgery)

## 2018-07-03 ENCOUNTER — Other Ambulatory Visit (HOSPITAL_COMMUNITY)
Admission: RE | Admit: 2018-07-03 | Discharge: 2018-07-03 | Disposition: A | Discharge status: Home / Self Care | Payer: Medicare Other | Source: Ambulatory Visit | Attending: Thoracic Surgery (Cardiothoracic Vascular Surgery) | Admitting: Thoracic Surgery (Cardiothoracic Vascular Surgery)

## 2018-07-03 ENCOUNTER — Ambulatory Visit
Admission: RE | Admit: 2018-07-03 | Discharge: 2018-07-03 | Disposition: A | Discharge status: Home / Self Care | Payer: Medicare Other | Source: Ambulatory Visit | Attending: Thoracic Surgery (Cardiothoracic Vascular Surgery) | Admitting: Thoracic Surgery (Cardiothoracic Vascular Surgery)

## 2018-07-03 ENCOUNTER — Other Ambulatory Visit: Payer: Self-pay

## 2018-07-03 ENCOUNTER — Encounter (HOSPITAL_COMMUNITY)
Admission: RE | Admit: 2018-07-03 | Discharge: 2018-07-03 | Disposition: A | Discharge status: Home / Self Care | Payer: Medicare Other | Source: Ambulatory Visit | Attending: Thoracic Surgery (Cardiothoracic Vascular Surgery) | Admitting: Thoracic Surgery (Cardiothoracic Vascular Surgery)

## 2018-07-03 ENCOUNTER — Encounter: Payer: Self-pay | Admitting: Thoracic Surgery (Cardiothoracic Vascular Surgery)

## 2018-07-03 DIAGNOSIS — I340 Nonrheumatic mitral (valve) insufficiency: Secondary | ICD-10-CM | POA: Insufficient documentation

## 2018-07-03 DIAGNOSIS — Z89011 Acquired absence of right thumb: Secondary | ICD-10-CM | POA: Diagnosis not present

## 2018-07-03 DIAGNOSIS — Z01818 Encounter for other preprocedural examination: Secondary | ICD-10-CM

## 2018-07-03 DIAGNOSIS — Z1159 Encounter for screening for other viral diseases: Secondary | ICD-10-CM | POA: Diagnosis not present

## 2018-07-03 DIAGNOSIS — I349 Nonrheumatic mitral valve disorder, unspecified: Secondary | ICD-10-CM | POA: Diagnosis not present

## 2018-07-03 DIAGNOSIS — Z79899 Other long term (current) drug therapy: Secondary | ICD-10-CM | POA: Diagnosis not present

## 2018-07-03 DIAGNOSIS — K7689 Other specified diseases of liver: Secondary | ICD-10-CM | POA: Diagnosis not present

## 2018-07-03 DIAGNOSIS — K219 Gastro-esophageal reflux disease without esophagitis: Secondary | ICD-10-CM | POA: Insufficient documentation

## 2018-07-03 DIAGNOSIS — E785 Hyperlipidemia, unspecified: Secondary | ICD-10-CM | POA: Insufficient documentation

## 2018-07-03 DIAGNOSIS — R079 Chest pain, unspecified: Secondary | ICD-10-CM | POA: Diagnosis not present

## 2018-07-03 HISTORY — DX: Cardiac murmur, unspecified: R01.1

## 2018-07-03 LAB — COMPREHENSIVE METABOLIC PANEL
ALT: 14 U/L (ref 0–44)
AST: 20 U/L (ref 15–41)
Albumin: 4.3 g/dL (ref 3.5–5.0)
Alkaline Phosphatase: 56 U/L (ref 38–126)
Anion gap: 11 (ref 5–15)
BUN: 14 mg/dL (ref 8–23)
CO2: 21 mmol/L — ABNORMAL LOW (ref 22–32)
Calcium: 9.4 mg/dL (ref 8.9–10.3)
Chloride: 109 mmol/L (ref 98–111)
Creatinine, Ser: 0.89 mg/dL (ref 0.61–1.24)
GFR calc Af Amer: >60 mL/min (ref >60)
GFR calc non Af Amer: >60 mL/min (ref >60)
Glucose, Bld: 113 mg/dL — ABNORMAL HIGH (ref 70–99)
Potassium: 4.1 mmol/L (ref 3.5–5.1)
Sodium: 141 mmol/L (ref 135–145)
Total Bilirubin: 1 mg/dL (ref 0.3–1.2)
Total Protein: 6.7 g/dL (ref 6.5–8.1)

## 2018-07-03 LAB — HEMOGLOBIN A1C
Hgb A1c MFr Bld: 5.1 % (ref 4.8–5.6)
Mean Plasma Glucose: 99.67 mg/dL

## 2018-07-03 LAB — CBC
HCT: 45.1 % (ref 39.0–52.0)
Hemoglobin: 15 g/dL (ref 13.0–17.0)
MCH: 29.6 pg (ref 26.0–34.0)
MCHC: 33.3 g/dL (ref 30.0–36.0)
MCV: 89 fL (ref 80.0–100.0)
Platelets: 178 10*3/uL (ref 150–400)
RBC: 5.07 MIL/uL (ref 4.22–5.81)
RDW: 12.9 % (ref 11.5–15.5)
WBC: 6.4 10*3/uL (ref 4.0–10.5)
nRBC: 0 % (ref 0.0–0.2)

## 2018-07-03 LAB — BLOOD GAS, ARTERIAL
Acid-Base Excess: 1.5 mmol/L (ref 0.0–2.0)
Bicarbonate: 25.9 mmol/L (ref 20.0–28.0)
Drawn by: 265211
FIO2: 21
O2 Saturation: 97.1 %
Patient temperature: 98.6
pCO2 arterial: 43.2 mmHg (ref 32.0–48.0)
pH, Arterial: 7.395 (ref 7.350–7.450)
pO2, Arterial: 92.7 mmHg (ref 83.0–108.0)

## 2018-07-03 LAB — PROTIME-INR
INR: 1.1 (ref 0.8–1.2)
Prothrombin Time: 14.3 seconds (ref 11.4–15.2)

## 2018-07-03 LAB — URINALYSIS, ROUTINE W REFLEX MICROSCOPIC
Bilirubin Urine: NEGATIVE
Glucose, UA: NEGATIVE mg/dL
Hgb urine dipstick: NEGATIVE
Ketones, ur: NEGATIVE mg/dL
Leukocytes,Ua: NEGATIVE
Nitrite: NEGATIVE
Protein, ur: NEGATIVE mg/dL
Specific Gravity, Urine: 1.009 (ref 1.005–1.030)
pH: 7 (ref 5.0–8.0)

## 2018-07-03 LAB — TYPE AND SCREEN
ABO/RH(D): A POS
Antibody Screen: NEGATIVE

## 2018-07-03 LAB — ABO/RH: ABO/RH(D): A POS

## 2018-07-03 LAB — APTT: aPTT: 28 seconds (ref 24–36)

## 2018-07-03 LAB — SURGICAL PCR SCREEN
MRSA, PCR: NEGATIVE
Staphylococcus aureus: NEGATIVE

## 2018-07-03 MED ORDER — IOPAMIDOL (ISOVUE-370) INJECTION 76%
75.0000 mL | Freq: Once | INTRAVENOUS | Status: AC | PRN
  Administered 2018-07-03: 08:00:00 75 mL via INTRAVENOUS

## 2018-07-04 LAB — NOVEL CORONAVIRUS, NAA (HOSP ORDER, SEND-OUT TO REF LAB; TAT 18-24 HRS): SARS-CoV-2, NAA: NOT DETECTED

## 2018-07-06 ENCOUNTER — Ambulatory Visit (INDEPENDENT_AMBULATORY_CARE_PROVIDER_SITE_OTHER): Payer: Medicare Other | Admitting: Thoracic Surgery (Cardiothoracic Vascular Surgery)

## 2018-07-06 ENCOUNTER — Other Ambulatory Visit: Payer: Self-pay

## 2018-07-06 ENCOUNTER — Encounter: Payer: Self-pay | Admitting: Thoracic Surgery (Cardiothoracic Vascular Surgery)

## 2018-07-06 VITALS — BP 150/80 | HR 80 | Temp 97.8°F | Resp 20 | Ht 69.0 in | Wt 170.0 lb

## 2018-07-06 DIAGNOSIS — I341 Nonrheumatic mitral (valve) prolapse: Secondary | ICD-10-CM | POA: Diagnosis not present

## 2018-07-06 DIAGNOSIS — I340 Nonrheumatic mitral (valve) insufficiency: Secondary | ICD-10-CM | POA: Diagnosis not present

## 2018-07-06 DIAGNOSIS — R911 Solitary pulmonary nodule: Secondary | ICD-10-CM | POA: Diagnosis not present

## 2018-07-06 NOTE — Progress Notes (Addendum)
Brendan [name]       [place] [address]             [phone]     CARDIOTHORACIC SURGERY OFFICE NOTE  Referring Provider is Minus Breeding, MD PCP is Plotnikov, [name], MD   HPI:  Patient is 68 year old male who returns the office today for follow-up of mitral valve prolapse with severe symptomatic primary mitral regurgitation with tentative plans to proceed with minimally invasive mitral valve repair later this week.  He reports no new problems or complaints over the past few weeks since his original surgical consultation on Jun 08, 2018.  He specifically denies any new onset of fever, cough, or worsening shortness of breath.  He has been practicing social distancing per guidelines and he has not been traveling.  The remainder of his review of systems is unchanged from previously.     Current Outpatient Medications  Medication Sig Dispense Refill   acetaminophen (TYLENOL) 500 MG tablet Take 500 mg by mouth every 6 (six) hours as needed (pain.).     chlorpheniramine (CHLOR-TRIMETON) 4 MG tablet Take 4 mg by mouth daily.     glycopyrrolate (ROBINUL) 2 MG tablet Take 1 tablet (2 mg total) by mouth daily. 90 tablet 3   lansoprazole (PREVACID) 30 MG capsule Take 30 mg by mouth daily before breakfast.      lovastatin (MEVACOR) 20 MG tablet Take 1 tablet (20 mg total) by mouth daily. (Patient taking differently: Take 20 mg by mouth at bedtime. ) 90 tablet 3   No current facility-administered medications for this visit.       Physical Exam:   BP (!) 150/80    Pulse 80    Temp 97.8 F (36.6 C)    Resp 20    Ht 5\' 9"  (1.753 m)    Wt 170 lb (77.1 kg)    SpO2 98% Comment: RA   BMI 25.10 kg/m   General:  Well-appearing  Chest:   Clear to auscultation  CV:   Regular rate and rhythm with prominent holosystolic murmur  Incisions:  n/a  Abdomen:  Soft nontender  Extremities:  Warm and well-perfused  Diagnostic Tests:  RIGHT/LEFT HEART CATH AND CORONARY  ANGIOGRAPHY  Conclusion   1.  Widely patent coronary arteries with minimal irregularity of the mid LAD, otherwise angiographically normal vessels 2.  Normal cardiac hemodynamics with the exception of prominent V waves in the pulmonary wedge position consistent with hemodynamically significant mitral insufficiency  Recommendations: Continue evaluation for mitral valve repair  Indications   Severe mitral insufficiency [I34.0 (ICD-10-CM)]  Procedural Details   Technical Details INDICATION: Severe mitral regurgitation/preoperative cath study in preparation for mitral valve repair  PROCEDURAL DETAILS: There was an indwelling IV in a right antecubital vein. Using normal sterile technique, the IV was changed out for a 5 Fr brachial sheath over a 0.018 inch wire. The right wrist was then prepped, draped, and anesthetized with 1% lidocaine. Using the modified Seldinger technique a 5/6 French Slender sheath was placed in the right radial artery. Intra-arterial verapamil was administered through the radial artery sheath. IV heparin was administered after a JR4 catheter was advanced into the central aorta. A Swan-Ganz catheter was used for the right heart catheterization. Standard protocol was followed for recording of right heart pressures and sampling of oxygen saturations. Fick cardiac output was calculated. Standard Judkins catheters were used for selective coronary angiography. LV pressure is recorded and an aortic valve pullback  is performed. There were no immediate procedural complications. The patient was transferred to the post catheterization recovery area for further monitoring.    Estimated blood loss <50 mL.   During this procedure medications were administered to achieve and maintain moderate conscious sedation while the patient's heart rate, blood pressure, and oxygen saturation were continuously monitored and I was present face-to-face 100% of this time.  Medications  (Filter:  Administrations occurring from 06/17/18 0814 to 06/17/18 0916)  Medication Rate/Dose/Volume Action  Date Time   fentaNYL (SUBLIMAZE) injection (mcg) 25 mcg Given 06/17/18 0828   Total dose as of 07/06/18 1216        25 mcg        midazolam (VERSED) injection (mg) 2 mg Given 06/17/18 0828   Total dose as of 07/06/18 1216        2 mg        lidocaine (PF) (XYLOCAINE) 1 % injection (mL) 2 mL Given 06/17/18 0843   Total dose as of 07/06/18 1216 2 mL Given 0845   4 mL        Radial Cocktail/Verapamil only (mL) 10 mL Given 06/17/18 0845   Total dose as of 07/06/18 1216        10 mL        Heparin (Porcine) in NaCl 1000-0.9 UT/500ML-% SOLN (mL) 500 mL Given 06/17/18 0852   Total dose as of 07/06/18 1216 500 mL Given 0852   1,000 mL        heparin injection (Units) 3,500 Units Given 06/17/18 0855   Total dose as of 07/06/18 1216        3,500 Units        iohexol (OMNIPAQUE) 350 MG/ML injection (mL) 40 mL Given 06/17/18 0907   Total dose as of 07/06/18 1216        40 mL        Sedation Time   Sedation Time Physician-1: 37 minutes 56 seconds  Coronary Findings   Diagnostic  Dominance: Co-dominant  Left Anterior Descending  The vessel exhibits minimal luminal irregularities.  First Septal Branch  The LAD is widely patent throughout its course. The diagonal branches are patent with no obstruction. The LAD reaches the LV apex. There is minimal irregularity in the midportion of the vessel.  Left Circumflex  Vessel is angiographically normal. The circumflex is a large vessel with no obstructive disease. The vessel is codominant with the RCA.  Right Coronary Artery  Vessel is small. Vessel is angiographically normal. Small, codominant vessel with no significant obstruction.  Intervention   No interventions have been documented.  Right Heart   Right Heart Pressures Hemodynamic findings consistent with mitral valve regurgitation. LV EDP is normal.  Coronary Diagrams   Diagnostic    Dominance: Co-dominant    Intervention   Implants    No implant documentation for this case.  Syngo Images   Show images for CARDIAC CATHETERIZATION  Images on Long Term Storage   Show images for Brendan [name], Brendan C "[name]"   Link to Procedure Log   Procedure Log    Hemo Data    Most Recent Value  Fick Cardiac Output 6.04 L/min  Fick Cardiac Output Index 3.17 (L/min)/BSA  RA A Wave 6 mmHg  RA V Wave 2 mmHg  RA Mean 2 mmHg  RV Systolic Pressure 24 mmHg  RV Diastolic Pressure 0 mmHg  RV EDP 5 mmHg  PA Systolic Pressure 23 mmHg  PA Diastolic Pressure 7 mmHg  PA Mean  14 mmHg  PW A Wave 13 mmHg  PW V Wave 18 mmHg  PW Mean 9 mmHg  AO Systolic Pressure 106 mmHg  AO Diastolic Pressure 62 mmHg  AO Mean 85 mmHg  LV Systolic Pressure 269 mmHg  LV Diastolic Pressure 1 mmHg  LV EDP 12 mmHg  AOp Systolic Pressure 485 mmHg  AOp Diastolic Pressure 62 mmHg  AOp Mean Pressure 85 mmHg  LVp Systolic Pressure 462 mmHg  LVp Diastolic Pressure 0 mmHg  LVp EDP Pressure 9 mmHg  QP/QS 1  TPVR Index 4.41 HRUI  TSVR Index 27.44 HRUI  PVR SVR Ratio 0.06  TPVR/TSVR Ratio 0.16      CT ANGIOGRAPHY CHEST, ABDOMEN AND PELVIS  TECHNIQUE: Multidetector CT imaging through the chest, abdomen and pelvis was performed using the standard protocol during bolus administration of intravenous contrast. Multiplanar reconstructed images and MIPs were obtained and reviewed to evaluate the vascular anatomy.  CONTRAST:  57mL ISOVUE-370 IOPAMIDOL (ISOVUE-370) INJECTION 76%  COMPARISON:  Chest radiograph April 11, 2009  FINDINGS: CTA CHEST FINDINGS  Cardiovascular: There is no thoracic aortic aneurysm or dissection. No appreciable thoracic aortic atherosclerotic changes noted. Visualized great vessels appear normal. There is no demonstrable pulmonary embolus. There is no evident pericardial effusion or pericardial thickening. The overall cardiac chamber sizes appear within normal  limits. No mitral valve calcification evident.  Mediastinum/Nodes: Thyroid appears unremarkable. There is no evident thoracic adenopathy. No esophageal lesions are appreciable.  Lungs/Pleura: There is no evident edema or consolidation. On axial slice 73 series 5, there is a 5 mm nodular opacity arising from the posterior aspect of the superior segment of the right lower lobe. On axial slice 703 series 5, there is a nodular opacity in the anterior segment of the right upper lobe measuring 7 x 6 mm. No evident pleural effusion or pleural thickening.  Musculoskeletal: There is degenerative change in the thoracic spine. There are no blastic or lytic bone lesions. There is no evident chest wall lesion. There is a small foramen of Bochdalek hernia on the left posteriorly containing only fat.  Review of the MIP images confirms the above findings.  CTA ABDOMEN AND PELVIS FINDINGS  VASCULAR  Aorta: There is no abdominal aortic aneurysm or dissection. No appreciable abdominal aortic atherosclerotic change.  Celiac: The celiac artery is mildly tortuous. The celiac artery and its branches appear widely patent without appreciable atherosclerotic plaque. No aneurysm or dissection involving the celiac artery and its branches.  SMA: Superior mesenteric artery and its branches are widely patent. No aneurysm or dissection evident.  Renals: There is a single renal artery on the left. There are 2 renal arteries arising from the aorta. The renal arteries and their branches appear widely patent in all segments. No aneurysm or dissection. No evident fibromuscular dysplasia.  IMA: Inferior mesenteric artery and its branches appear widely patent. No aneurysm or dissection.  Inflow: The pelvic arterial vessels are widely patent throughout their respective courses. No aneurysm or dissection is evident. Proximal superficial and profunda femoral artery branches are widely patent without  aneurysm or dissection.  Veins: No obvious venous abnormality within the limitations of this arterial phase study.  Review of the MIP images confirms the above findings.  NON-VASCULAR  Hepatobiliary: There is a benign-appearing lesion in the posterior segment of the right lobe of the liver near the dome measuring 2.6 x 2.0 cm. It has attenuation values slightly higher than is expected with a cyst. There is slight increase in attenuation in this  lesion on delayed imaging. It does not have typical hemangioma appearance. No other focal liver lesions are evident. Gallbladder wall is not appreciably thickened. No biliary duct dilatation evident.  Pancreas: There is no pancreatic mass or inflammatory focus.  Spleen: No splenic lesions are evident.  Adrenals/Urinary Tract: Adrenals bilaterally appear unremarkable. Kidneys bilaterally show no evident mass or hydronephrosis on either side. There is no demonstrable renal or ureteral calculus on either side. Urinary bladder is midline with wall thickness within normal limits.  Stomach/Bowel: There is no appreciable bowel wall or mesenteric thickening. There is moderate stool throughout the colon. No evident bowel obstruction. Terminal ileum appears unremarkable.  Lymphatic: No evident adenopathy in the abdomen or pelvis.  Reproductive: Prostate absent.  No evident pelvic mass.  Other: Appendix appears unremarkable. No demonstrable abscess or ascites in the abdomen or pelvis.  Musculoskeletal: There is disc degeneration at L3-4. No blastic or lytic bone lesions are evident. No intramuscular or abdominal wall lesions are appreciable.  Review of the MIP images confirms the above findings.  IMPRESSION: CT angiogram chest:  1. No thoracic aortic aneurysm or dissection. No appreciable aortic atherosclerosis.  2.  No demonstrable pulmonary embolus.  3. Nodular opacities in the right lung, largest measuring 7 x 6  mm. Non-contrast chest CT at 6-12 months is recommended. If the nodule is stable at time of repeat CT, then future CT at 18-24 months (from today's scan) is considered optional for low-risk patients, but is recommended for high-risk patients. This recommendation follows the consensus statement: Guidelines for Management of Incidental Pulmonary Nodules Detected on CT Images: From the Fleischner Society 2017; Radiology 2017; 284:228-243.  4.  No demonstrable thoracic adenopathy.  CT angiogram abdomen; CT angiogram pelvis:  1. No appreciable aneurysm or dissection involving the aorta, major mesenteric, and major pelvic arterial vessels. No appreciable atherosclerotic plaque noted in these vessels. No stenosis evident in these vessels.  2. Benign-appearing lesion in the right lobe of the liver near the dome posteriorly. This lesion does not represent a typical hemangioma. If further imaging is felt to be advisable, pre and serial post-contrast MR or CT of the liver could be helpful to further evaluate.  3.  Prostate absent.  4. No bowel obstruction. No abscess in the abdomen or pelvis. Appendix appears normal.  5. No evident renal or ureteral calculus. No hydronephrosis. Urinary bladder wall thickness normal.   Electronically Signed   By: Lowella Grip III M.D.   On: 07/03/2018 08:43    Impression:  Patient has mitral valve prolapse with stage D severe symptomatic primary mitral regurgitation.  He describes mild symptoms of exertional shortness of breath that occurs only with more strenuous physical exertion as well as possible mild orthopnea and PND, consistent with chronic diastolic congestive heart failure, New [place] Heart Association functional class I-II.  I personally reviewed the patient's recent transthoracic and transesophageal echocardiograms.  The patient has myxomatous degenerative disease of the mitral valve with a large flail segment involving a portion  of the posterior leaflet of the mitral valve.  There is at least moderately severe mitral regurgitation.  Left ventricular size and systolic function remains normal.  Based upon review of the TEE I feel that the patient's valve can be repaired with greater than 95% confidence of an excellent and durable result.    Diagnostic cardiac catheterization reveals no significant coronary artery disease.  Wedge tracing revealed large V waves consistent with severe mitral regurgitation.  CT angiography reveals no contraindication to peripheral  cannulation for surgery.  The patient does have a few small benign-appearing nodules in the right lung.  Although relatively low risk follow-up CT imaging in 6 months is recommended.   Plan:  The patient was again counseled at length regarding the indications, risks and potential benefits of mitral valve repair.  The rationale for elective surgery has been explained, including a comparison between surgery and continued medical therapy with close follow-up.  The likelihood of successful and durable mitral valve repair has been discussed with particular reference to the findings of their recent echocardiogram.  Based upon these findings and previous experience, I have quoted them a greater than 95 percent likelihood of successful valve repair with less than 1 percent risk of mortality or major morbidity.  Alternative surgical approaches have been discussed including a comparison between conventional sternotomy and minimally-invasive techniques.  The relative risks and benefits of each have been reviewed as they pertain to the patient's specific circumstances, and expectations for the patient's postoperative convalescence has been discussed.  The patient desires to proceed with surgery later this week as previously planned.   The presence of the lung nodules on CT scan was discussed as well as the need for follow-up imaging in 6 months.  The patient understands and accepts all  potential risks of surgery including but not limited to risk of death, stroke or other neurologic complication, myocardial infarction, congestive heart failure, respiratory failure, renal failure, bleeding requiring transfusion and/or reexploration, arrhythmia, infection or other wound complications, pneumonia, pleural and/or pericardial effusion, pulmonary embolus, aortic dissection or other major vascular complication, or delayed complications related to valve repair or replacement including but not limited to structural valve deterioration and failure, thrombosis, embolization, endocarditis, or paravalvular leak.  Specific risks potentially related to the minimally-invasive approach were discussed at length, including but not limited to risk of conversion to full or partial sternotomy, aortic dissection or other major vascular complication, unilateral acute lung injury or pulmonary edema, phrenic nerve dysfunction or paralysis, rib fracture, chronic pain, lung hernia, or lymphocele. All of their questions have been answered.    I spent in excess of 15 minutes during the conduct of this office consultation and >50% of this time involved direct face-to-face encounter with the patient for counseling and/or coordination of their care.    Valentina Gu. Roxy Manns, MD 07/06/2018 9:49 AM

## 2018-07-06 NOTE — Progress Notes (Addendum)
Anesthesia Chart Review:  Case:  [id number] Date/Time:  07/08/18 0815   Procedures:      MINIMALLY INVASIVE MITRAL VALVE REPAIR (MVR) (Right Chest) - GLUTARALDEHYDE     TRANSESOPHAGEAL ECHOCARDIOGRAM (TEE) (N/A )   Anesthesia type:  General   Pre-op diagnosis:  MR   Location:  [facility] OR ROOM 15 / [place] OR   Surgeon:  [name], MD      DISCUSSION: Patient is a 68 year old male scheduled for the above procedure.   History includes never smoker, murmur (MVP, severe MR), GERD, HLD, decreased hearing, right lung nodule (07/03/18, f/u 6-12 months rec), prostatectomy.  07/03/18 presurgical COVID test negative. Anesthesia team to evaluate on the day of surgery.   VS: BP (!) 147/70   Pulse 69   Temp 36.4 C   Resp 20   Ht 5\' 9"  (1.753 m)   Wt 76.7 kg   SpO2 100%   BMI 24.96 kg/m   PROVIDERS: Plotnikov, [name], MD is PCP Minus Breeding, MD is cardiologist   LABS: Labs reviewed: Acceptable for surgery. (all labs ordered are listed, but only abnormal results are displayed)  Labs Reviewed  COMPREHENSIVE METABOLIC PANEL - Abnormal; Notable for the following components:      Result Value   CO2 21 (*)    Glucose, Bld 113 (*)    All other components within normal limits  URINALYSIS, ROUTINE W REFLEX MICROSCOPIC - Abnormal; Notable for the following components:   Color, Urine COLORLESS (*)    All other components within normal limits  SURGICAL PCR SCREEN  NOVEL CORONAVIRUS, NAA (HOSPITAL ORDER, SEND-OUT TO REF LAB)  APTT  BLOOD GAS, ARTERIAL  CBC  HEMOGLOBIN A1C  PROTIME-INR  TYPE AND SCREEN  ABO/RH    IMAGES: CXR 07/03/18: IMPRESSION: No active cardiopulmonary disease.  CTA chest/abd/pelvis 07/03/18: IMPRESSION: CT angiogram chest: 1. No thoracic aortic aneurysm or dissection. No appreciable aortic atherosclerosis. 2.  No demonstrable pulmonary embolus. 3. Nodular opacities in the right lung, largest measuring 7 x 6 mm. Non-contrast chest CT at 6-12 months is  recommended. If the nodule is stable at time of repeat CT, then future CT at 18-24 months (from today's scan) is considered optional for low-risk patients, but is recommended for high-risk patients. This recommendation follows the consensus statement: Guidelines for Management of Incidental Pulmonary Nodules Detected on CT Images: From the Fleischner Society 2017; Radiology 2017; 284:228-243. 4.  No demonstrable thoracic adenopathy. CT angiogram abdomen; CT angiogram pelvis: 1. No appreciable aneurysm or dissection involving the aorta, major mesenteric, and major pelvic arterial vessels. No appreciable atherosclerotic plaque noted in these vessels. No stenosis evident in these vessels. 2. Benign-appearing lesion in the right lobe of the liver near the dome posteriorly. This lesion does not represent a typical hemangioma. If further imaging is felt to be advisable, pre and serial post-contrast MR or CT of the liver could be helpful to further evaluate. 3.  Prostate absent. 4. No bowel obstruction. No abscess in the abdomen or pelvis. Appendix appears normal. 5. No evident renal or ureteral calculus. No hydronephrosis. Urinary bladder wall thickness normal.   EKG: 07/03/18: Normal sinus rhythm Possible Left atrial enlargement Borderline ECG No old tracing to compare Confirmed by Virl Axe [phone]) on 07/03/2018 9:25:14 PM   CV: Cardiac cath 06/17/18: 1.  Widely patent coronary arteries with minimal irregularity of the mid LAD, otherwise angiographically normal vessels 2.  Normal cardiac hemodynamics with the exception of prominent V waves in the pulmonary  wedge position consistent with hemodynamically significant mitral insufficiency Recommendations: Continue evaluation for mitral valve repair  TEE 04/06/18: IMPRESSIONS  1. The left ventricle has normal systolic function, with an ejection fraction of 60-65%. The cavity size was normal. Left ventricular diastolic parameters were  normal. No evidence of left ventricular regional wall motion abnormalities.  2. 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 There is particularly severe prolapse of the lateral scallop of the posterior leaflet (P1), moderate prolapse of the middle scallop of the posterior leaflet (P2).  3. The right ventricle has normal systolic function. The wall thickness and cavity size was normal.  4. Right ventricular systolic pressure is normal with an estimated pressure of 26 mmHg.  5. Moderate to severe mitral valve regurgitation. The jet is eccentric, anteriorly and medially directed.  6. Trivial aortic valve regurgitation.  7. The aortic root, ascending aorta, aortic arch and descending aorta are normal in size and structure.  Carotid US 07/03/18: Summary: Right Carotid: Velocities in the right ICA are consistent with a 1-39% stenosis. Left Carotid: There is no evidence of stenosis in the left ICA. Vertebrals: Bilateral vertebral arteries demonstrate antegrade flow.   Past Medical History:  Diagnosis Date  . Cancer [facility])    prostate.  skin basal cell  . Decreased hearing   . ED (erectile dysfunction)   . GERD (gastroesophageal reflux disease)   . Heart murmur   . Hyperlipidemia   . IBS (irritable bowel syndrome)   . Incidental pulmonary nodule, > 62mm and < 33mm 07/02/2018   Right lung - needs f/u CT 6 months  . MVP (mitral valve prolapse)   . Nonrheumatic mitral valve regurgitation   . Ruptured cervical disc 2008    Past Surgical History:  Procedure Laterality Date  . ACHILLES TENDON SURGERY  1980   prosthesis  . AMPUTATION FINGER / THUMB Right 12/2004   tip of thumb for strep infection  . EYE SURGERY    . PROSTATECTOMY  2007  . RIGHT/LEFT HEART CATH AND CORONARY ANGIOGRAPHY N/A 06/17/2018   Procedure: RIGHT/LEFT HEART CATH AND CORONARY ANGIOGRAPHY;  Surgeon: Sherren Mocha, MD;  Location: [place] [facility];  Service: Cardiovascular;  Laterality: N/A;  . TEE WITHOUT  CARDIOVERSION N/A 04/06/2018   Procedure: TRANSESOPHAGEAL ECHOCARDIOGRAM (TEE);  Surgeon: Sanda Klein, MD;  Location: [facility] ENDOSCOPY;  Service: Cardiovascular;  Laterality: N/A;  . TONSILLECTOMY      MEDICATIONS: . acetaminophen (TYLENOL) 500 MG tablet  . chlorpheniramine (CHLOR-TRIMETON) 4 MG tablet  . glycopyrrolate (ROBINUL) 2 MG tablet  . lansoprazole (PREVACID) 30 MG capsule  . lovastatin (MEVACOR) 20 MG tablet   No current facility-administered medications for this encounter.     Myra Gianotti, PA-C Surgical Short Stay/Anesthesiology [facility] Phone [phone] [facility] Phone [phone] 07/06/2018 12:20 PM

## 2018-07-06 NOTE — Anesthesia Preprocedure Evaluation (Addendum)
Anesthesia Evaluation  Patient identified by MRN, date of birth, ID band Patient awake    Reviewed: Allergy & Precautions, H&P , NPO status , Patient's Chart, lab work & pertinent test results  Airway Mallampati: II   Neck ROM: full    Dental   Pulmonary neg pulmonary ROS,    breath sounds clear to auscultation       Cardiovascular + Valvular Problems/Murmurs MR and MVP  Rhythm:regular Rate:Normal     Neuro/Psych    GI/Hepatic GERD  ,  Endo/Other    Renal/GU      Musculoskeletal   Abdominal   Peds  Hematology   Anesthesia Other Findings   Reproductive/Obstetrics                             Anesthesia Physical Anesthesia Plan  ASA: II  Anesthesia Plan: General   Post-op Pain Management:    Induction: Intravenous  PONV Risk Score and Plan: 2 and Ondansetron, Dexamethasone, Midazolam and Treatment may vary due to age or medical condition  Airway Management Planned: Double Lumen EBT  Additional Equipment: Arterial line, CVP, PA Cath, 3D TEE and Ultrasound Guidance Line Placement  Intra-op Plan:   Post-operative Plan: Post-operative intubation/ventilation  Informed Consent: I have reviewed the patients History and Physical, chart, labs and discussed the procedure including the risks, benefits and alternatives for the proposed anesthesia with the patient or authorized representative who has indicated his/her understanding and acceptance.       Plan Discussed with: CRNA, Anesthesiologist and Surgeon  Anesthesia Plan Comments: (PAT note written 07/06/2018 by Myra Gianotti, PA-C. )       Anesthesia Quick Evaluation

## 2018-07-06 NOTE — Patient Instructions (Signed)
   Continue taking all current medications without change through the day before surgery.  Have nothing to eat or drink after midnight the night before surgery.  On the morning of surgery take only Prevacid with a sip of water.

## 2018-07-07 ENCOUNTER — Encounter (HOSPITAL_COMMUNITY): Payer: Self-pay | Admitting: Certified Registered Nurse Anesthetist

## 2018-07-07 MED ORDER — SODIUM CHLORIDE 0.9 % IV SOLN
INTRAVENOUS | Status: DC
  Filled 2018-07-07: qty 30

## 2018-07-07 MED ORDER — NOREPINEPHRINE BITARTRATE 1 MG/ML IV SOLN
0.0000 ug/min | INTRAVENOUS | Status: DC
  Filled 2018-07-07: qty 4

## 2018-07-07 MED ORDER — KENNESTONE BLOOD CARDIOPLEGIA (KBC) MANNITOL SYRINGE (20%, 32ML)
32.0000 mL | INTRAVENOUS | Status: DC
  Filled 2018-07-07 (×2): qty 1

## 2018-07-07 MED ORDER — KENNESTONE BLOOD CARDIOPLEGIA VIAL
13.0000 mL | Status: DC
  Filled 2018-07-07 (×2): qty 1

## 2018-07-07 MED ORDER — INSULIN REGULAR(HUMAN) IN NACL 100-0.9 UT/100ML-% IV SOLN
INTRAVENOUS | Status: AC
  Administered 2018-07-08 (×2): 1 [IU]/h via INTRAVENOUS
  Filled 2018-07-07: qty 100

## 2018-07-07 MED ORDER — DEXMEDETOMIDINE HCL IN NACL 400 MCG/100ML IV SOLN
0.1000 ug/kg/h | INTRAVENOUS | Status: DC
  Filled 2018-07-07: qty 100

## 2018-07-07 MED ORDER — VANCOMYCIN HCL 10 G IV SOLR
1250.0000 mg | INTRAVENOUS | Status: AC
  Administered 2018-07-08: 1250 mg via INTRAVENOUS
  Filled 2018-07-07: qty 1250

## 2018-07-07 MED ORDER — TRANEXAMIC ACID (OHS) BOLUS VIA INFUSION
15.0000 mg/kg | INTRAVENOUS | Status: AC
  Administered 2018-07-08: 09:00:00 1156.5 mg via INTRAVENOUS
  Filled 2018-07-07: qty 1157

## 2018-07-07 MED ORDER — MAGNESIUM SULFATE 50 % IJ SOLN
40.0000 meq | INTRAMUSCULAR | Status: DC
  Filled 2018-07-07: qty 9.85

## 2018-07-07 MED ORDER — TRANEXAMIC ACID (OHS) PUMP PRIME SOLUTION
2.0000 mg/kg | INTRAVENOUS | Status: DC
  Filled 2018-07-07: qty 1.54

## 2018-07-07 MED ORDER — EPINEPHRINE PF 1 MG/ML IJ SOLN
0.0000 ug/min | INTRAVENOUS | Status: DC
  Filled 2018-07-07: qty 4

## 2018-07-07 MED ORDER — NITROGLYCERIN IN D5W 200-5 MCG/ML-% IV SOLN
2.0000 ug/min | INTRAVENOUS | Status: DC
  Filled 2018-07-07: qty 250

## 2018-07-07 MED ORDER — PLASMA-LYTE 148 IV SOLN
INTRAVENOUS | Status: DC
  Filled 2018-07-07: qty 2.5

## 2018-07-07 MED ORDER — VANCOMYCIN HCL 1000 MG IV SOLR
INTRAVENOUS | Status: DC
  Filled 2018-07-07: qty 1000

## 2018-07-07 MED ORDER — POTASSIUM CHLORIDE 2 MEQ/ML IV SOLN
80.0000 meq | INTRAVENOUS | Status: DC
  Filled 2018-07-07: qty 40

## 2018-07-07 MED ORDER — TRANEXAMIC ACID 1000 MG/10ML IV SOLN
1.5000 mg/kg/h | INTRAVENOUS | Status: AC
  Administered 2018-07-08: 10:00:00 1.5 mg/kg/h via INTRAVENOUS
  Filled 2018-07-07: qty 25

## 2018-07-07 MED ORDER — PHENYLEPHRINE HCL-NACL 20-0.9 MG/250ML-% IV SOLN
30.0000 ug/min | INTRAVENOUS | Status: DC
  Filled 2018-07-07: qty 250

## 2018-07-07 MED ORDER — DOPAMINE-DEXTROSE 3.2-5 MG/ML-% IV SOLN
0.0000 ug/kg/min | INTRAVENOUS | Status: DC
  Filled 2018-07-07: qty 250

## 2018-07-07 MED ORDER — MILRINONE LACTATE IN DEXTROSE 20-5 MG/100ML-% IV SOLN
0.3000 ug/kg/min | INTRAVENOUS | Status: DC
  Filled 2018-07-07: qty 100

## 2018-07-07 MED ORDER — SODIUM CHLORIDE 0.9 % IV SOLN
750.0000 mg | INTRAVENOUS | Status: AC
  Administered 2018-07-08: 750 mg via INTRAVENOUS
  Filled 2018-07-07: qty 750

## 2018-07-07 MED ORDER — SODIUM CHLORIDE 0.9 % IV SOLN
1.5000 g | INTRAVENOUS | Status: AC
  Administered 2018-07-08: 1.5 g via INTRAVENOUS
  Filled 2018-07-07: qty 1.5

## 2018-07-07 MED ORDER — GLUTARALDEHYDE 0.625% SOAKING SOLUTION
TOPICAL | Status: DC
  Filled 2018-07-07: qty 50

## 2018-07-07 NOTE — H&P (Signed)
[name] [address]             [phone]          CARDIOTHORACIC SURGERY HISTORY AND PHYSICAL EXAM  Referring Provider is Brendan Breeding, [name], [name], MD      Chief Complaint  Patient presents with   Mitral Regurgitation    ECHO 03/17/18/TEE 04/06/18    HPI:  Patient is a 68 year old male with history of hyperlipidemia and prostate cancer status post prostatectomy who has been referred for surgical consultation to discuss treatment options for management of recently discovered mitral valve prolapse with severe mitral regurgitation.  Patient states that he has been healthy all of his life.  He was recently noted to have a heart murmur on physical exam by his primary care physician.  An echocardiogram was performed documenting the presence of mitral valve prolapse with severe mitral regurgitation.  The patient was referred to Dr. Percival [name] and underwent transesophageal echocardiogram April 06, 2018 confirming the presence of mitral valve prolapse with moderate to severe mitral regurgitation.  Cardiothoracic surgical consultation was requested.  The patient's initial consultation visit was postponed because of the ongoing COVID-19 pandemic.  He was telephone recently and requested face-to-face office visit because of increasing concerns relating to his underlying heart condition.  Patient is married and lives with his wife here in [place].  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  He has remained reasonably active physically all of his adult life although he does not exercise on a regular basis.  He does report symptoms of exertional shortness of breath that occur only with more strenuous physical exertion such as walking at a brisk pace or walking up a hill.  He has recently discovered mild tendency to get short of breath  when he lays flat in bed.  He now props himself up on 1 or 2 pillows.  He states that he occasionally wakes up at night feeling like he is gasping for his breath.  He has not had any chest pain or chest tightness.  He denies any lower extremity edema.  He reports no palpitations, dizzy spells, or syncope.  He denies any recent fevers or cough.  He has not been around any persons with known or suspected COVID-19 infection.  Patient is 68 year old male who returns the office today for follow-up of mitral valve prolapse with severe symptomatic primary mitral regurgitation with tentative plans to proceed with minimally invasive mitral valve repair later this week.  He reports no new problems or complaints over the past few weeks since his original surgical consultation on Jun 08, 2018.  He specifically denies any new onset of fever, cough, or worsening shortness of breath.  He has been practicing social distancing per guidelines and he has not been traveling.  The remainder of his review of systems is unchanged from previously.   Past Medical History:  Diagnosis Date   Cancer [facility])    prostate.  skin basal cell   Decreased hearing    ED (erectile dysfunction)    GERD (gastroesophageal reflux disease)    Heart murmur    Hyperlipidemia    IBS (irritable bowel syndrome)    Incidental pulmonary nodule, > 44mm and < 21mm 07/02/2018   Right lung - needs f/u CT 6 months   MVP (mitral valve  prolapse)    Nonrheumatic mitral valve regurgitation    Ruptured cervical disc 2008    Past Surgical History:  Procedure Laterality Date   ACHILLES TENDON SURGERY  1980   prosthesis   AMPUTATION FINGER / THUMB Right 12/2004   tip of thumb for strep infection   EYE SURGERY     PROSTATECTOMY  2007   RIGHT/LEFT HEART CATH AND CORONARY ANGIOGRAPHY N/A 06/17/2018   Procedure: RIGHT/LEFT HEART CATH AND CORONARY ANGIOGRAPHY;  Surgeon: Sherren Mocha, MD;  Location: [facility];  Service:  Cardiovascular;  Laterality: N/A;   TEE WITHOUT CARDIOVERSION N/A 04/06/2018   Procedure: TRANSESOPHAGEAL ECHOCARDIOGRAM (TEE);  Surgeon: Sanda Klein, MD;  Location: [facility] END[facility]COPY;  Service: Cardiovascular;  Laterality: N/A;   TONSILLECTOMY      Family History  Problem Relation Age of Onset   Heart disease Mother        arrhythmia, angina   Heart disease Father 79       CAD, MVR   Hyperlipidemia Other     Social History Social History   Tobacco Use   Smoking status: Never Smoker   Smokeless tobacco: Never Used  Substance Use Topics   Alcohol use: No    Comment: occasionally   Drug use: No    Prior to Admission medications   Medication Sig Start Date End Date Taking? Authorizing Provider  acetaminophen (TYLENOL) 500 MG tablet Take 500 mg by mouth every 6 (six) hours as needed (pain.).   Yes [provider]  chlorpheniramine (CHLOR-TRIMETON) 4 MG tablet Take 4 mg by mouth daily.   Yes [provider]  glycopyrrolate (ROBINUL) 2 MG tablet Take 1 tablet (2 mg total) by mouth daily. 07/22/17  Yes Plotnikov, [name], MD  lansoprazole (PREVACID) 30 MG capsule Take 30 mg by mouth daily before breakfast.    Yes [provider]  lovastatin (MEVACOR) 20 MG tablet Take 1 tablet (20 mg total) by mouth daily. Patient taking differently: Take 20 mg by mouth at bedtime.  07/22/17  Yes Plotnikov, [name], MD    Allergies  Allergen Reactions   Hydrocodone Other (See Comments)    chills      Review of Systems:              General:                      normal appetite, normal energy, no weight gain, no weight loss, no fever             Cardiac:                       no chest pain with exertion, no chest pain at rest, + SOB with exertion, no resting SOB, ? Possible mild PND, + mild orthopnea, no palpitations, no arrhythmia, no atrial fibrillation, no LE edema, no dizzy spells, no syncope             Respiratory:                 no shortness of  breath, no home oxygen, no productive cough, no dry cough, no bronchitis, no wheezing, no hemoptysis, no asthma, no pain with inspiration or cough, no sleep apnea, no CPAP at night             GI:  no difficulty swallowing, no reflux, no frequent heartburn, no hiatal hernia, no abdominal pain, no constipation, no diarrhea, no hematochezia, no hematemesis, no melena             GU:                              no dysuria,  no frequency, no urinary tract infection, no hematuria, no enlarged prostate, no kidney stones, no kidney disease             Vascular:                     no pain suggestive of claudication, no pain in feet, no leg cramps, no varicose veins, no DVT, no non-healing foot ulcer             Neuro:                         no stroke, no TIA's, no seizures, no headaches, no temporary blindness one eye,  no slurred speech, no peripheral neuropathy, no chronic pain, no instability of gait, no memory/cognitive dysfunction             Musculoskeletal:         no arthritis, no joint swelling, no myalgias, no difficulty walking, normal mobility              Skin:                            no rash, no itching, no skin infections, no pressure sores or ulcerations             Psych:                         no anxiety, no depression, no nervousness, no unusual recent stress             Eyes:                           no blurry vision, no floaters, no recent vision changes, + wears glasses or contacts             ENT:                            no hearing loss, no loose or painful teeth, no dentures, last saw dentist September 2019             Hematologic:               no easy bruising, no abnormal bleeding, no clotting disorder, no frequent epistaxis             Endocrine:                   no diabetes, does not check CBG's at home                           Physical Exam:              BP 132/78 (BP Location: Right Arm, Patient Position: Sitting, Cuff Size: Large)  Comment: MANUALLY   Pulse 72    Temp 97.7 F (36.5 C) (Temporal)    Ht 5'  9" (1.753 m)    Wt 165 lb (74.8 kg)    SpO2 100% Comment: RA   BMI 24.37 kg/m              General:                      Thin,  well-appearing             HEENT:                       Unremarkable              Neck:                           no JVD, no bruits, no adenopathy              Chest:                          clear to auscultation, symmetrical breath sounds, no wheezes, no rhonchi              CV:                              RRR, grade IV/VI holosystolic murmur              Abdomen:                    soft, non-tender, no masses              Extremities:                 warm, well-perfused, pulses palpable, no LE edema             Rectal/GU                   Deferred             Neuro:                         Grossly non-focal and symmetrical throughout             Skin:                            Clean and dry, no rashes, no breakdown   Diagnostic Tests:  ECHOCARDIOGRAM REPORT     Patient Name: Brendan [name] Date of Exam: 03/17/2018 Medical Rec #: [id number] Height: 69.0 in Accession #: [id number] Weight: 171.0 lb Date of Birth: 1950-06-03 BSA: 1.93 m Patient Age: 7 years BP: 132/82 mmHg Patient Gender: M HR: 73 bpm. Exam Location: [address]   Procedure: 2D Echo, 3D Echo, Cardiac Doppler and Color Doppler  Indications: R01.1 Murmur  History: Patient has no prior history of Echocardiogram examinations. Signs/Symptoms: Murmur; Risk Factors: Family History of Coronary Artery Disease and Dyslipidemia.  Sonographer: Deliah Boston RDCS Referring Phys: [place]   1. The left ventricle has normal systolic function of 19-41%. The cavity size was normal. Left ventricular diastolic Doppler parameters are  consistent with pseudonormal Elevated left ventricular end-diastolic pressure. 2. The right ventricle has normal systolic function. The cavity was normal. There is no increase in right ventricular wall thickness. 3. Left atrial size  was mildly dilated. 4. Right atrial size was mildly dilated. 5. Severe mitral valve prolapse. 6. 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 There is moderate thickening. Mitral valve regurgitation is severe by color flow Doppler. The MR jet is anteriorly-directed. 7. The tricuspid valve is normal in structure. 8. The aortic valve is tricuspid There is mild thickening of the aortic valve. 9. The pulmonic valve was normal in structure. 10. There is bi-leaflet mitral valve prolapse posterior > anterior with anteriorly directed jet of mitral regurgitation that is severe. A TEE Is recommended.  FINDINGS Left Ventricle: The left ventricle has normal systolic function of 09-47%. The cavity size was normal. There is no increase in left ventricular wall thickness. Left ventricular diastolic Doppler parameters are consistent with pseudonormal (grade II)  Elevated left ventricular end-diastolic pressure Right Ventricle: The right ventricle has normal systolic function. The cavity was normal. There is no increase in right ventricular wall thickness. Left Atrium: left atrial size was mildly dilated Right Atrium: right atrial size was mildly dilated Interatrial Septum: No atrial level shunt detected by color flow Doppler. Pericardium: There is no evidence of pericardial effusion. Mitral Valve: The mitral valve is myxomatous. There is moderate thickening. Mitral valve regurgitation is severe by color flow Doppler. The MR jet is anteriorly-directed. There is severe holosystolic prolapse of of both mitral leaflets of the mitral  valve. Tricuspid Valve: The tricuspid valve is normal in structure. Tricuspid valve regurgitation is mild by color flow  Doppler. Aortic Valve: The aortic valve is tricuspid There is mild thickening of the aortic valve. Aortic valve regurgitation was not visualized by color flow Doppler. Pulmonic Valve: The pulmonic valve was normal in structure. Pulmonic valve regurgitation is not visualized by color flow Doppler. Venous: The inferior vena cava is normal in size with greater than 50% respiratory variability. Additional Findings: There is bi-leaflet mitral valve prolapse posterior > anterior with anteriorly directed jet of mitral regurgitation that is severe. A TEE Is recommended.  LEFT VENTRICLE PLAX 2D (Teich) LV EF: 58.3 % Diastology LVIDd: 4.90 cm LV e' lateral: 11.70 cm/s LVIDs: 3.39 cm LV E/e' lateral: 12.3 LV PW: 0.81 cm LV e' medial: 13.10 cm/s LV IVS: 0.91 cm LV E/e' medial: 11.0 LVOT diam: 2.00 cm LV SV: 66 ml LVOT Area: 3.14 cm  RIGHT VENTRICLE RV S prime: 13.10 cm/s TAPSE (M-mode): 2.9 cm RVSP: 21.9 mmHg  LEFT ATRIUM Index RIGHT ATRIUM Index LA diam: 3.70 cm 1.91 cm/m RA Pressure: 3 mmHg LA Vol (A2C): 60.9 ml 31.51 ml/m RA Area: 18.60 cm LA Vol (A4C): 78.0 ml 40.35 ml/m RA Volume: 51.70 ml 26.75 ml/m LA Biplane Vol: 69.9 ml 36.16 ml/m AORTIC VALVE LVOT Vmax: 115.00 cm/s LVOT Vmean: 65.450 cm/s LVOT VTI: 0.204 m  AORTA Ao Root diam: 3.35 cm Ao Asc diam: 3.00 cm  MITRAL VALVE TR Peak grad: 18.9 mmHg MV Area (PHT): 3.06 cm TR Vmax: 250.00 cm/s MV Peak grad: 8.9 mmHg RVSP: 21.9 mmHg MV Mean grad: 3.0 mmHg MV Vmax: 1.49 m/s MV Vmean: 83.5 cm/s MV VTI: 0.36 m MV PHT: 72 msec MV Decel Time: 187 msec MR Peak grad: 78.9 mmHg MR Mean grad: 47.0 mmHg MR Vmax: 444.00 cm/s MR Vmean: 320.0 cm/s MR PISA: 12.32 MV E velocity: 144.00 cm/s MV A velocity: 109.00  cm/s MV E/A ratio: 1.32   Ena Dawley MD Electronically signed by Ena Dawley MD Signature Date/Time: 03/17/2018/5:04:46 PM      TRANSESOPHOGEAL ECHO REPORT   Patient Name: Brendan [name]  [place] Date of Exam: 04/06/2018 Medical Rec #: [id number] Height: 69.0 in Accession #: [id number] Weight: 171.0 lb Date of Birth: 07-Mar-1950 BSA: 1.93 m Patient Age: 51 years BP: 110/53 mmHg Patient Gender: M HR: 63 bpm. Exam Location: Inpatient   Procedure: Transesophageal Echo  Indications: MR, MVP  History: Patient has prior history of Echocardiogram examinations, most recent 03/17/2018. Mitral Valve Disease and Mitral Valve Prolapse; Signs/Symptoms: Fever; Risk Factors: Dyslipidemia.  Sonographer: Mikki Santee RDCS (AE) Referring Phys: [place] Diagnosing Phys: Sanda Klein MD    PROCEDURE: The patient developed no complications during the procedure.  IMPRESSIONS   1. The left ventricle has normal systolic function, with an ejection fraction of 60-65%. The cavity size was normal. Left ventricular diastolic parameters were normal. No evidence of left ventricular regional wall motion abnormalities. 2. 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 There is particularly severe prolapse of the lateral scallop of the posterior leaflet (P1), moderate prolapse of the middle scallop of the posterior leaflet (P2). 3. The right ventricle has normal systolic function. The wall thickness and cavity size was normal. 4. Right ventricular systolic pressure is normal with an estimated pressure of 26 mmHg. 5. Moderate to severe mitral valve regurgitation. The jet is eccentric, anteriorly and medially directed. 6. Trivial aortic valve regurgitation. 7. The aortic root, ascending aorta, aortic arch and  descending aorta are normal in size and structure.  FINDINGS Left Ventricle: The left ventricle has normal systolic function, with an ejection fraction of 60-65%. The cavity size was normal. There is no increase in left ventricular wall thickness. Left ventricular diastolic parameters were normal. No evidence of  left ventricular regional wall motion abnormalities. Right Ventricle: The right ventricle has normal systolic function. The cavity size was normal. There is no increase in right ventricular wall thickness. Right ventricular systolic pressure is normal with an estimated pressure of 26.2 mmHg. Left Atrium: Left atrial size was normal in size. Right Atrium: Right atrial size was normal in size. Right atrial pressure is estimated at 3 mmHg. Interatrial Septum: No atrial level shunt detected by color flow Doppler. Pericardium: There is no evidence of pericardial effusion. Mitral Valve: The mitral valve is myxomatous. Mild thickening of the mitral valve leaflet. Mitral valve regurgitation is moderate to severe by color flow Doppler. The MR jet is eccentric anteriorly and medially directed. No evidence of mitral valve  stenosis. There is severe mitral valve regurgitation by PISA measuring 8.34. There is severe prolapse of of both mitral leaflets of the mitral valve.There is particularly severe prolapse of the lateral scallop of the posterior leaflet (P1), moderately  severe prolapse of the middle scallop (P2). ERO area 0.34 cm sq, regurgitant volume 61 mL, regurgitant fraction 51% by PISA method. Regurgitant volume 40 mL by continuity equation. Vena contracta 5 mm. There is no flow reversal in either right or left  pulmonary veins. There is no mitral stenosis, the valve area is 6 cm sq. by planimetry. Tricuspid Valve: The tricuspid valve was normal in structure. Tricuspid valve regurgitation is mild by color flow Doppler. Aortic Valve: The aortic valve is normal in structure. Aortic valve  regurgitation is trivial by color flow Doppler. Pulmonic Valve: The pulmonic valve was normal in structure. Pulmonic valve regurgitation is trivial by color flow Doppler. Aorta: The aortic root, ascending aorta and aortic arch are normal in size and structure.  LEFT VENTRICLE PLAX 2D (Teich) LV EF: 71.0 % LVIDd: 5.60 cm LVIDs: 3.31 cm LVOT diam: 2.05 cm LV  SV: 109 ml LVOT Area: 3.30 cm  RIGHT VENTRICLE RVSP: 26.2 mmHg  RIGHT ATRIUM RA Pressure: 3 mmHg AORTIC VALVE LVOT Vmax: 105.00 cm/s LVOT Vmean: 68.700 cm/s LVOT VTI: 0.183 m  MITRAL VALVE TRICUSPID VALVE MV Area (planimetry): 6.00 cm TR Peak grad: 23.2 mmHg MV VTI: 0.17 m TR Vmax: 241.00 cm/s MR Peak grad: 127.5 mmHg RVSP: 26.2 mmHg MR Mean grad: 98.0 mmHg MR Vmax: 564.67 cm/s MR Vmean: 464.0 cm/s MR PISA: ERO 0.34 cm2 MR Vena Contracta: 0.50 cm   Mihai Croitoru MD Electronically signed by Sanda Klein MD Signature Date/Time: 04/06/2018/2:04:31 PM   RIGHT/LEFT HEART CATH AND CORONARY ANGIOGRAPHY  Conclusion   1. Widely patent coronary arteries with minimal irregularity of the mid LAD, otherwise angiographically normal vessels 2. Normal cardiac hemodynamics with the exception of prominent V waves in the pulmonary wedge position consistent with hemodynamically significant mitral insufficiency  Recommendations: Continue evaluation for mitral valve repair  Indications   Severe mitral insufficiency [I34.0 (ICD-10-CM)]  Procedural Details   Technical Details INDICATION: Severe mitral regurgitation/preoperative cath study in preparation for mitral valve repair  PROCEDURAL DETAILS: There was an indwelling IV in a right antecubital vein. Using normal sterile technique, the IV was changed out for a 5 Fr brachial sheath over a 0.018 inch wire. The  right wrist was then prepped, draped, and anesthetized with 1% lidocaine. Using the modified Seldinger technique a 5/6 French Slender sheath was placed in the right radial artery. Intra-arterial verapamil was administered through the radial artery sheath. IV heparin was administered after a JR4 catheter was advanced into the central aorta. A Swan-Ganz catheter was used for the right heart catheterization. Standard protocol was followed for recording of right heart pressures and sampling of oxygen saturations. Fick cardiac output was calculated. Standard Judkins catheters were used for selective coronary angiography. LV pressure is recorded and an aortic valve pullback is performed. There were no immediate procedural complications. The patient was transferred to the post catheterization recovery area for further monitoring.    Estimated blood loss <50 mL.   During this procedure medications were administered to achieve and maintain moderate conscious sedation while the patient's heart rate, blood pressure, and oxygen saturation were continuously monitored and I was present face-to-face 100% of this time.  Medications  (Filter: Administrations occurring from 06/17/18 0814 to 06/17/18 0916)          Medication Rate/Dose/Volume Action  Date Time   fentaNYL (SUBLIMAZE) injection (mcg) 25 mcg Given 06/17/18 0828   Total dose as of 07/06/18 1216        25 mcg        midazolam (VERSED) injection (mg) 2 mg Given 06/17/18 0828   Total dose as of 07/06/18 1216        2 mg        lidocaine (PF) (XYLOCAINE) 1 % injection (mL) 2 mL Given 06/17/18 0843   Total dose as of 07/06/18 1216 2 mL Given 0845   4 mL        Radial Cocktail/Verapamil only (mL) 10 mL Given 06/17/18 0845   Total dose as of 07/06/18 1216        10 mL        Heparin (Porcine) in NaCl 1000-0.9 UT/500ML-% SOLN (mL) 500 mL Given 06/17/18 0852   Total dose as of 07/06/18 1216 500 mL Given 0852    1,000 mL        heparin injection (Units) 3,500 Units Given 06/17/18 0855   Total  dose as of 07/06/18 1216        3,500 Units        iohexol (OMNIPAQUE) 350 MG/ML injection (mL) 40 mL Given 06/17/18 0907   Total dose as of 07/06/18 1216        40 mL        Sedation Time   Sedation Time Physician-1: 37 minutes 56 seconds  Coronary Findings   Diagnostic  Dominance: Co-dominant  Left Anterior Descending  The vessel exhibits minimal luminal irregularities.  First Septal Branch  The LAD is widely patent throughout its course. The diagonal branches are patent with no obstruction. The LAD reaches the LV apex. There is minimal irregularity in the midportion of the vessel.  Left Circumflex  Vessel is angiographically normal. The circumflex is a large vessel with no obstructive disease. The vessel is codominant with the RCA.  Right Coronary Artery  Vessel is small. Vessel is angiographically normal. Small, codominant vessel with no significant obstruction.  Intervention   No interventions have been documented.  Right Heart   Right Heart Pressures Hemodynamic findings consistent with mitral valve regurgitation. LV EDP is normal.  Coronary Diagrams   Diagnostic  Dominance: Co-dominant    Intervention   Implants       No implant documentation for this case.  Syngo Images   Show images for CARDIAC CATHETERIZATION  Images on Long Term Storage   Show images for Rish, Cleavon C "[name]"   Link to Procedure Log   Procedure Log    Hemo Data    Most Recent Value  Fick Cardiac Output 6.04 L/min  Fick Cardiac Output Index 3.17 (L/min)/BSA  RA A Wave 6 mmHg  RA V Wave 2 mmHg  RA Mean 2 mmHg  RV Systolic Pressure 24 mmHg  RV Diastolic Pressure 0 mmHg  RV EDP 5 mmHg  PA Systolic Pressure 23 mmHg  PA Diastolic Pressure 7 mmHg  PA Mean 14 mmHg  PW A Wave 13 mmHg  PW V Wave 18 mmHg  PW Mean 9 mmHg  AO Systolic Pressure 779 mmHg   AO Diastolic Pressure 62 mmHg  AO Mean 85 mmHg  LV Systolic Pressure 390 mmHg  LV Diastolic Pressure 1 mmHg  LV EDP 12 mmHg  AOp Systolic Pressure 300 mmHg  AOp Diastolic Pressure 62 mmHg  AOp Mean Pressure 85 mmHg  LVp Systolic Pressure 923 mmHg  LVp Diastolic Pressure 0 mmHg  LVp EDP Pressure 9 mmHg  QP/QS 1  TPVR Index 4.41 HRUI  TSVR Index 27.44 HRUI  PVR SVR Ratio 0.06  TPVR/TSVR Ratio 0.16      CT ANGIOGRAPHY CHEST, ABDOMEN AND PELVIS  TECHNIQUE: Multidetector CT imaging through the chest, abdomen and pelvis was performed using the standard protocol during bolus administration of intravenous contrast. Multiplanar reconstructed images and MIPs were obtained and reviewed to evaluate the vascular anatomy.  CONTRAST: 22mL ISOVUE-370 IOPAMIDOL (ISOVUE-370) INJECTION 76%  COMPARISON: Chest radiograph April 11, 2009  FINDINGS: CTA CHEST FINDINGS  Cardiovascular: There is no thoracic aortic aneurysm or dissection. No appreciable thoracic aortic atherosclerotic changes noted. Visualized great vessels appear normal. There is no demonstrable pulmonary embolus. There is no evident pericardial effusion or pericardial thickening. The overall cardiac chamber sizes appear within normal limits. No mitral valve calcification evident.  Mediastinum/Nodes: Thyroid appears unremarkable. There is no evident thoracic adenopathy. No esophageal lesions are appreciable.  Lungs/Pleura: There is no evident edema or consolidation. On axial slice 73 series 5, there is a 5 mm nodular  opacity arising from the posterior aspect of the superior segment of the right lower lobe. On axial slice 034 series 5, there is a nodular opacity in the anterior segment of the right upper lobe measuring 7 x 6 mm. No evident pleural effusion or pleural thickening.  Musculoskeletal: There is degenerative change in the thoracic spine. There are no blastic or lytic bone lesions. There is no  evident chest wall lesion. There is a small foramen of Bochdalek hernia on the left posteriorly containing only fat.  Review of the MIP images confirms the above findings.  CTA ABDOMEN AND PELVIS FINDINGS  VASCULAR  Aorta: There is no abdominal aortic aneurysm or dissection. No appreciable abdominal aortic atherosclerotic change.  Celiac: The celiac artery is mildly tortuous. The celiac artery and its branches appear widely patent without appreciable atherosclerotic plaque. No aneurysm or dissection involving the celiac artery and its branches.  SMA: Superior mesenteric artery and its branches are widely patent. No aneurysm or dissection evident.  Renals: There is a single renal artery on the left. There are 2 renal arteries arising from the aorta. The renal arteries and their branches appear widely patent in all segments. No aneurysm or dissection. No evident fibromuscular dysplasia.  IMA: Inferior mesenteric artery and its branches appear widely patent. No aneurysm or dissection.  Inflow: The pelvic arterial vessels are widely patent throughout their respective courses. No aneurysm or dissection is evident. Proximal superficial and profunda femoral artery branches are widely patent without aneurysm or dissection.  Veins: No obvious venous abnormality within the limitations of this arterial phase study.  Review of the MIP images confirms the above findings.  NON-VASCULAR  Hepatobiliary: There is a benign-appearing lesion in the posterior segment of the right lobe of the liver near the dome measuring 2.6 x 2.0 cm. It has attenuation values slightly higher than is expected with a cyst. There is slight increase in attenuation in this lesion on delayed imaging. It does not have typical hemangioma appearance. No other focal liver lesions are evident. Gallbladder wall is not appreciably thickened. No biliary duct dilatation evident.  Pancreas: There is no  pancreatic mass or inflammatory focus.  Spleen: No splenic lesions are evident.  Adrenals/Urinary Tract: Adrenals bilaterally appear unremarkable. Kidneys bilaterally show no evident mass or hydronephrosis on either side. There is no demonstrable renal or ureteral calculus on either side. Urinary bladder is midline with wall thickness within normal limits.  Stomach/Bowel: There is no appreciable bowel wall or mesenteric thickening. There is moderate stool throughout the colon. No evident bowel obstruction. Terminal ileum appears unremarkable.  Lymphatic: No evident adenopathy in the abdomen or pelvis.  Reproductive: Prostate absent. No evident pelvic mass.  Other: Appendix appears unremarkable. No demonstrable abscess or ascites in the abdomen or pelvis.  Musculoskeletal: There is disc degeneration at L3-4. No blastic or lytic bone lesions are evident. No intramuscular or abdominal wall lesions are appreciable.  Review of the MIP images confirms the above findings.  IMPRESSION: CT angiogram chest:  1. No thoracic aortic aneurysm or dissection. No appreciable aortic atherosclerosis.  2. No demonstrable pulmonary embolus.  3. Nodular opacities in the right lung, largest measuring 7 x 6 mm. Non-contrast chest CT at 6-12 months is recommended. If the nodule is stable at time of repeat CT, then future CT at 18-24 months (from today's scan) is considered optional for low-risk patients, but is recommended for high-risk patients. This recommendation follows the consensus statement: Guidelines for Management of Incidental Pulmonary Nodules Detected on  CT Images: From the Fleischner Society 2017; Radiology 2017; [phone].  4. No demonstrable thoracic adenopathy.  CT angiogram abdomen; CT angiogram pelvis:  1. No appreciable aneurysm or dissection involving the aorta, major mesenteric, and major pelvic arterial vessels. No appreciable atherosclerotic plaque  noted in these vessels. No stenosis evident in these vessels.  2. Benign-appearing lesion in the right lobe of the liver near the dome posteriorly. This lesion does not represent a typical hemangioma. If further imaging is felt to be advisable, pre and serial post-contrast MR or CT of the liver could be helpful to further evaluate.  3. Prostate absent.  4. No bowel obstruction. No abscess in the abdomen or pelvis. Appendix appears normal.  5. No evident renal or ureteral calculus. No hydronephrosis. Urinary bladder wall thickness normal.   Electronically Signed By: Lowella Grip III M.D. On: 07/03/2018 08:43    Impression:  Patient has mitral valve prolapse with stage D severe symptomatic primary mitral regurgitation. He describes mild symptoms of exertional shortness of breath that occurs only with more strenuous physical exertion as well as possible mild orthopnea and PND, consistent with chronic diastolic congestive heart failure, New [place] Heart Association functional class I-II. I personally reviewed the patient's recent transthoracic and transesophageal echocardiograms. The patient has myxomatous degenerative disease of the mitral valve with a large flail segment involving a portion of the posterior leaflet of the mitral valve. There is at least moderatelysevere mitral regurgitation. Left ventricular size and systolic function remains normal. Based upon review of the TEE I feel that the patient's valve can be repaired with greater than 95% confidence of an excellent and durable result.   Diagnostic cardiac catheterization reveals no significant coronary artery disease.  Wedge tracing revealed large V waves consistent with severe mitral regurgitation.  CT angiography reveals no contraindication to peripheral cannulation for surgery.  The patient does have a few small benign-appearing nodules in the right lung.  Although relatively low risk follow-up CT imaging  in 6 months is recommended.   Plan:  The patient was again counseled at length regarding the indications, risks and potential benefits of mitral valve repair.  The rationale for elective surgery has been explained, including a comparison between surgery and continued medical therapy with close follow-up.  The likelihood of successful and durable mitral valve repair has been discussed with particular reference to the findings of their recent echocardiogram.  Based upon these findings and previous experience, I have quoted them a greater than 95 percent likelihood of successful valve repair with less than 1 percent risk of mortality or major morbidity.  Alternative surgical approaches have been discussed including a comparison between conventional sternotomy and minimally-invasive techniques.  The relative risks and benefits of each have been reviewed as they pertain to the patient's specific circumstances, and expectations for the patient's postoperative convalescence has been discussed.  The patient desires to proceed with surgery later this week as previously planned.   The presence of the lung nodules on CT scan was discussed as well as the need for follow-up imaging in 6 months.  The patient understands and accepts all potential risks of surgery including but not limited to risk of death, stroke or other neurologic complication, myocardial infarction, congestive heart failure, respiratory failure, renal failure, bleeding requiring transfusion and/or reexploration, arrhythmia, infection or other wound complications, pneumonia, pleural and/or pericardial effusion, pulmonary embolus, aortic dissection or other major vascular complication, or delayed complications related to valve repair or replacement including but not limited to structural valve  deterioration and failure, thrombosis, embolization, endocarditis, or paravalvular leak.  Specific risks potentially related to the minimally-invasive approach  were discussed at length, including but not limited to risk of conversion to full or partial sternotomy, aortic dissection or other major vascular complication, unilateral acute lung injury or pulmonary edema, phrenic nerve dysfunction or paralysis, rib fracture, chronic pain, lung hernia, or lymphocele. All of their questions have been answered.     Valentina Gu. Roxy Manns, MD 07/06/2018 9:49 AM

## 2018-07-08 ENCOUNTER — Inpatient Hospital Stay (HOSPITAL_COMMUNITY): Payer: Medicare Other

## 2018-07-08 ENCOUNTER — Inpatient Hospital Stay (HOSPITAL_COMMUNITY): Payer: Medicare Other | Admitting: Vascular Surgery

## 2018-07-08 ENCOUNTER — Inpatient Hospital Stay (HOSPITAL_COMMUNITY): Payer: Medicare Other | Admitting: Certified Registered Nurse Anesthetist

## 2018-07-08 ENCOUNTER — Inpatient Hospital Stay (HOSPITAL_COMMUNITY)
Admission: RE | Admit: 2018-07-08 | Discharge: 2018-07-13 | DRG: 220 | Disposition: A | Discharge status: Home / Self Care | Payer: Medicare Other | Attending: Thoracic Surgery (Cardiothoracic Vascular Surgery) | Admitting: Thoracic Surgery (Cardiothoracic Vascular Surgery)

## 2018-07-08 ENCOUNTER — Other Ambulatory Visit: Payer: Self-pay

## 2018-07-08 ENCOUNTER — Encounter (HOSPITAL_COMMUNITY): Payer: Self-pay

## 2018-07-08 ENCOUNTER — Encounter (HOSPITAL_COMMUNITY)
Admission: RE | Disposition: A | Discharge status: Home / Self Care | Payer: Self-pay | Source: Home / Self Care | Attending: Thoracic Surgery (Cardiothoracic Vascular Surgery)

## 2018-07-08 DIAGNOSIS — K219 Gastro-esophageal reflux disease without esophagitis: Secondary | ICD-10-CM | POA: Diagnosis present

## 2018-07-08 DIAGNOSIS — Z954 Presence of other heart-valve replacement: Secondary | ICD-10-CM | POA: Diagnosis not present

## 2018-07-08 DIAGNOSIS — I441 Atrioventricular block, second degree: Secondary | ICD-10-CM | POA: Diagnosis not present

## 2018-07-08 DIAGNOSIS — E785 Hyperlipidemia, unspecified: Secondary | ICD-10-CM | POA: Diagnosis present

## 2018-07-08 DIAGNOSIS — R001 Bradycardia, unspecified: Secondary | ICD-10-CM | POA: Diagnosis not present

## 2018-07-08 DIAGNOSIS — I080 Rheumatic disorders of both mitral and aortic valves: Secondary | ICD-10-CM | POA: Diagnosis not present

## 2018-07-08 DIAGNOSIS — Z8249 Family history of ischemic heart disease and other diseases of the circulatory system: Secondary | ICD-10-CM

## 2018-07-08 DIAGNOSIS — Z9079 Acquired absence of other genital organ(s): Secondary | ICD-10-CM | POA: Diagnosis not present

## 2018-07-08 DIAGNOSIS — K589 Irritable bowel syndrome without diarrhea: Secondary | ICD-10-CM | POA: Diagnosis not present

## 2018-07-08 DIAGNOSIS — I341 Nonrheumatic mitral (valve) prolapse: Secondary | ICD-10-CM | POA: Diagnosis present

## 2018-07-08 DIAGNOSIS — Z85828 Personal history of other malignant neoplasm of skin: Secondary | ICD-10-CM

## 2018-07-08 DIAGNOSIS — R918 Other nonspecific abnormal finding of lung field: Secondary | ICD-10-CM | POA: Diagnosis present

## 2018-07-08 DIAGNOSIS — D62 Acute posthemorrhagic anemia: Secondary | ICD-10-CM | POA: Diagnosis not present

## 2018-07-08 DIAGNOSIS — Z8546 Personal history of malignant neoplasm of prostate: Secondary | ICD-10-CM | POA: Diagnosis not present

## 2018-07-08 DIAGNOSIS — J9811 Atelectasis: Secondary | ICD-10-CM

## 2018-07-08 DIAGNOSIS — Z885 Allergy status to narcotic agent status: Secondary | ICD-10-CM

## 2018-07-08 DIAGNOSIS — Z79899 Other long term (current) drug therapy: Secondary | ICD-10-CM | POA: Diagnosis not present

## 2018-07-08 DIAGNOSIS — Z8349 Family history of other endocrine, nutritional and metabolic diseases: Secondary | ICD-10-CM | POA: Diagnosis not present

## 2018-07-08 DIAGNOSIS — E8770 Fluid overload, unspecified: Secondary | ICD-10-CM | POA: Diagnosis not present

## 2018-07-08 DIAGNOSIS — Z9889 Other specified postprocedural states: Secondary | ICD-10-CM

## 2018-07-08 DIAGNOSIS — J939 Pneumothorax, unspecified: Secondary | ICD-10-CM | POA: Diagnosis not present

## 2018-07-08 DIAGNOSIS — E119 Type 2 diabetes mellitus without complications: Secondary | ICD-10-CM | POA: Diagnosis present

## 2018-07-08 DIAGNOSIS — H9190 Unspecified hearing loss, unspecified ear: Secondary | ICD-10-CM | POA: Diagnosis present

## 2018-07-08 DIAGNOSIS — I340 Nonrheumatic mitral (valve) insufficiency: Principal | ICD-10-CM | POA: Diagnosis present

## 2018-07-08 DIAGNOSIS — J90 Pleural effusion, not elsewhere classified: Secondary | ICD-10-CM | POA: Diagnosis not present

## 2018-07-08 DIAGNOSIS — D696 Thrombocytopenia, unspecified: Secondary | ICD-10-CM | POA: Diagnosis not present

## 2018-07-08 DIAGNOSIS — Z4682 Encounter for fitting and adjustment of non-vascular catheter: Secondary | ICD-10-CM | POA: Diagnosis not present

## 2018-07-08 HISTORY — PX: TEE WITHOUT CARDIOVERSION: SHX5443

## 2018-07-08 HISTORY — DX: Other specified postprocedural states: Z98.890

## 2018-07-08 HISTORY — PX: MITRAL VALVE REPAIR: SHX2039

## 2018-07-08 LAB — POCT I-STAT 7, (LYTES, BLD GAS, ICA,H+H)
Acid-Base Excess: 1 mmol/L (ref 0.0–2.0)
Acid-base deficit: 2 mmol/L (ref 0.0–2.0)
Acid-base deficit: 4 mmol/L — ABNORMAL HIGH (ref 0.0–2.0)
Acid-base deficit: 4 mmol/L — ABNORMAL HIGH (ref 0.0–2.0)
Acid-base deficit: 5 mmol/L — ABNORMAL HIGH (ref 0.0–2.0)
Bicarbonate: 26.6 mmol/L (ref 20.0–28.0)
Bicarbonate: 22.7 mmol/L (ref 20.0–28.0)
Bicarbonate: 23.8 mmol/L (ref 20.0–28.0)
Bicarbonate: 23.6 mmol/L (ref 20.0–28.0)
Bicarbonate: 21.2 mmol/L (ref 20.0–28.0)
Calcium, Ion: 0.94 mmol/L — ABNORMAL LOW (ref 1.15–1.40)
Calcium, Ion: 1 mmol/L — ABNORMAL LOW (ref 1.15–1.40)
Calcium, Ion: 1.1 mmol/L — ABNORMAL LOW (ref 1.15–1.40)
Calcium, Ion: 1.12 mmol/L — ABNORMAL LOW (ref 1.15–1.40)
Calcium, Ion: 1.1 mmol/L — ABNORMAL LOW (ref 1.15–1.40)
HCT: 25 % — ABNORMAL LOW (ref 39.0–52.0)
HCT: 25 % — ABNORMAL LOW (ref 39.0–52.0)
HCT: 36 % — ABNORMAL LOW (ref 39.0–52.0)
HCT: 37 % — ABNORMAL LOW (ref 39.0–52.0)
HCT: 36 % — ABNORMAL LOW (ref 39.0–52.0)
Hemoglobin: 8.5 g/dL — ABNORMAL LOW (ref 13.0–17.0)
Hemoglobin: 8.5 g/dL — ABNORMAL LOW (ref 13.0–17.0)
Hemoglobin: 12.2 g/dL — ABNORMAL LOW (ref 13.0–17.0)
Hemoglobin: 12.6 g/dL — ABNORMAL LOW (ref 13.0–17.0)
Hemoglobin: 12.2 g/dL — ABNORMAL LOW (ref 13.0–17.0)
O2 Saturation: 100 %
O2 Saturation: 100 %
O2 Saturation: 81 %
O2 Saturation: 96 %
O2 Saturation: 95 %
Patient temperature: 35.8
Patient temperature: 36.1
Patient temperature: 36.4
Potassium: 4.2 mmol/L (ref 3.5–5.1)
Potassium: 3.9 mmol/L (ref 3.5–5.1)
Potassium: 3.8 mmol/L (ref 3.5–5.1)
Potassium: 4 mmol/L (ref 3.5–5.1)
Potassium: 4.1 mmol/L (ref 3.5–5.1)
Sodium: 138 mmol/L (ref 135–145)
Sodium: 141 mmol/L (ref 135–145)
Sodium: 142 mmol/L (ref 135–145)
Sodium: 142 mmol/L (ref 135–145)
Sodium: 141 mmol/L (ref 135–145)
TCO2: 28 mmol/L (ref 22–32)
TCO2: 24 mmol/L (ref 22–32)
TCO2: 25 mmol/L (ref 22–32)
TCO2: 25 mmol/L (ref 22–32)
TCO2: 22 mmol/L (ref 22–32)
pCO2 arterial: 44.7 mmHg (ref 32.0–48.0)
pCO2 arterial: 36.4 mmHg (ref 32.0–48.0)
pCO2 arterial: 49.8 mmHg — ABNORMAL HIGH (ref 32.0–48.0)
pCO2 arterial: 51.2 mmHg — ABNORMAL HIGH (ref 32.0–48.0)
pCO2 arterial: 39.4 mmHg (ref 32.0–48.0)
pH, Arterial: 7.383 (ref 7.350–7.450)
pH, Arterial: 7.403 (ref 7.350–7.450)
pH, Arterial: 7.281 — ABNORMAL LOW (ref 7.350–7.450)
pH, Arterial: 7.267 — ABNORMAL LOW (ref 7.350–7.450)
pH, Arterial: 7.335 — ABNORMAL LOW (ref 7.350–7.450)
pO2, Arterial: 426 mmHg — ABNORMAL HIGH (ref 83.0–108.0)
pO2, Arterial: 179 mmHg — ABNORMAL HIGH (ref 83.0–108.0)
pO2, Arterial: 48 mmHg — ABNORMAL LOW (ref 83.0–108.0)
pO2, Arterial: 87 mmHg (ref 83.0–108.0)
pO2, Arterial: 80 mmHg — ABNORMAL LOW (ref 83.0–108.0)

## 2018-07-08 LAB — CBC
HCT: 37.3 % — ABNORMAL LOW (ref 39.0–52.0)
HCT: 38.7 % — ABNORMAL LOW (ref 39.0–52.0)
Hemoglobin: 12.4 g/dL — ABNORMAL LOW (ref 13.0–17.0)
Hemoglobin: 12.9 g/dL — ABNORMAL LOW (ref 13.0–17.0)
MCH: 30 pg (ref 26.0–34.0)
MCH: 30 pg (ref 26.0–34.0)
MCHC: 33.2 g/dL (ref 30.0–36.0)
MCHC: 33.3 g/dL (ref 30.0–36.0)
MCV: 90.3 fL (ref 80.0–100.0)
MCV: 90 fL (ref 80.0–100.0)
Platelets: 127 10*3/uL — ABNORMAL LOW (ref 150–400)
Platelets: 115 10*3/uL — ABNORMAL LOW (ref 150–400)
RBC: 4.13 MIL/uL — ABNORMAL LOW (ref 4.22–5.81)
RBC: 4.3 MIL/uL (ref 4.22–5.81)
RDW: 12.8 % (ref 11.5–15.5)
RDW: 12.9 % (ref 11.5–15.5)
WBC: 16.4 10*3/uL — ABNORMAL HIGH (ref 4.0–10.5)
WBC: 14.3 10*3/uL — ABNORMAL HIGH (ref 4.0–10.5)
nRBC: 0 % (ref 0.0–0.2)
nRBC: 0 % (ref 0.0–0.2)

## 2018-07-08 LAB — GLUCOSE, CAPILLARY
Glucose-Capillary: 108 mg/dL — ABNORMAL HIGH (ref 70–99)
Glucose-Capillary: 117 mg/dL — ABNORMAL HIGH (ref 70–99)
Glucose-Capillary: 118 mg/dL — ABNORMAL HIGH (ref 70–99)
Glucose-Capillary: 119 mg/dL — ABNORMAL HIGH (ref 70–99)
Glucose-Capillary: 123 mg/dL — ABNORMAL HIGH (ref 70–99)
Glucose-Capillary: 124 mg/dL — ABNORMAL HIGH (ref 70–99)
Glucose-Capillary: 127 mg/dL — ABNORMAL HIGH (ref 70–99)
Glucose-Capillary: 130 mg/dL — ABNORMAL HIGH (ref 70–99)
Glucose-Capillary: 133 mg/dL — ABNORMAL HIGH (ref 70–99)

## 2018-07-08 LAB — CREATININE, SERUM
Creatinine, Ser: 0.82 mg/dL (ref 0.61–1.24)
GFR calc Af Amer: >60 mL/min (ref >60)
GFR calc non Af Amer: >60 mL/min (ref >60)

## 2018-07-08 LAB — POCT I-STAT 4, (NA,K, GLUC, HGB,HCT)
Glucose, Bld: 95 mg/dL (ref 70–99)
Glucose, Bld: 78 mg/dL (ref 70–99)
Glucose, Bld: 96 mg/dL (ref 70–99)
Glucose, Bld: 114 mg/dL — ABNORMAL HIGH (ref 70–99)
Glucose, Bld: 113 mg/dL — ABNORMAL HIGH (ref 70–99)
Glucose, Bld: 115 mg/dL — ABNORMAL HIGH (ref 70–99)
HCT: 36 % — ABNORMAL LOW (ref 39.0–52.0)
HCT: 26 % — ABNORMAL LOW (ref 39.0–52.0)
HCT: 34 % — ABNORMAL LOW (ref 39.0–52.0)
HCT: 26 % — ABNORMAL LOW (ref 39.0–52.0)
HCT: 25 % — ABNORMAL LOW (ref 39.0–52.0)
HCT: 26 % — ABNORMAL LOW (ref 39.0–52.0)
Hemoglobin: 12.2 g/dL — ABNORMAL LOW (ref 13.0–17.0)
Hemoglobin: 11.6 g/dL — ABNORMAL LOW (ref 13.0–17.0)
Hemoglobin: 8.8 g/dL — ABNORMAL LOW (ref 13.0–17.0)
Hemoglobin: 8.8 g/dL — ABNORMAL LOW (ref 13.0–17.0)
Hemoglobin: 8.5 g/dL — ABNORMAL LOW (ref 13.0–17.0)
Hemoglobin: 8.8 g/dL — ABNORMAL LOW (ref 13.0–17.0)
Potassium: 4.7 mmol/L (ref 3.5–5.1)
Potassium: 4.2 mmol/L (ref 3.5–5.1)
Potassium: 4.3 mmol/L (ref 3.5–5.1)
Potassium: 4.8 mmol/L (ref 3.5–5.1)
Potassium: 3.9 mmol/L (ref 3.5–5.1)
Potassium: 5.1 mmol/L (ref 3.5–5.1)
Sodium: 143 mmol/L (ref 135–145)
Sodium: 139 mmol/L (ref 135–145)
Sodium: 143 mmol/L (ref 135–145)
Sodium: 140 mmol/L (ref 135–145)
Sodium: 140 mmol/L (ref 135–145)
Sodium: 142 mmol/L (ref 135–145)

## 2018-07-08 LAB — HEMOGLOBIN AND HEMATOCRIT, BLOOD
HCT: 28.7 % — ABNORMAL LOW (ref 39.0–52.0)
Hemoglobin: 9.6 g/dL — ABNORMAL LOW (ref 13.0–17.0)

## 2018-07-08 LAB — APTT: aPTT: 36 seconds (ref 24–36)

## 2018-07-08 LAB — PROTIME-INR
INR: 1.4 — ABNORMAL HIGH (ref 0.8–1.2)
Prothrombin Time: 17.3 seconds — ABNORMAL HIGH (ref 11.4–15.2)

## 2018-07-08 LAB — PLATELET COUNT: Platelets: 109 10*3/uL — ABNORMAL LOW (ref 150–400)

## 2018-07-08 LAB — MAGNESIUM: Magnesium: 3 mg/dL — ABNORMAL HIGH (ref 1.7–2.4)

## 2018-07-08 SURGERY — REPAIR, MITRAL VALVE, MINIMALLY INVASIVE
Anesthesia: General | Site: Chest | Laterality: Right

## 2018-07-08 MED ORDER — METOPROLOL TARTRATE 12.5 MG HALF TABLET
12.5000 mg | ORAL_TABLET | Freq: Once | ORAL | Status: AC
  Administered 2018-07-08: 07:00:00 12.5 mg via ORAL
  Filled 2018-07-08: qty 1

## 2018-07-08 MED ORDER — CHLORHEXIDINE GLUCONATE 4 % EX LIQD: 30.0000 mL | CUTANEOUS | Status: DC

## 2018-07-08 MED ORDER — ROCURONIUM BROMIDE 10 MG/ML (PF) SYRINGE
PREFILLED_SYRINGE | INTRAVENOUS | Status: DC | PRN
  Administered 2018-07-08: 100 mg via INTRAVENOUS
  Administered 2018-07-08: 60 mg via INTRAVENOUS

## 2018-07-08 MED ORDER — SUCCINYLCHOLINE CHLORIDE 20 MG/ML IJ SOLN
INTRAMUSCULAR | Status: DC | PRN
  Administered 2018-07-08: 120 mg via INTRAVENOUS

## 2018-07-08 MED ORDER — SODIUM CHLORIDE 0.9% FLUSH
10.0000 mL | Freq: Two times a day (BID) | INTRAVENOUS | Status: DC
  Administered 2018-07-08 – 2018-07-12 (×8): 10 mL

## 2018-07-08 MED ORDER — INSULIN REGULAR(HUMAN) IN NACL 100-0.9 UT/100ML-% IV SOLN: INTRAVENOUS | Status: DC

## 2018-07-08 MED ORDER — BUPIVACAINE 0.5 % ON-Q PUMP SINGLE CATH 400 ML
400.0000 mL | INJECTION | Status: DC
  Administered 2018-07-08: 400 mL
  Filled 2018-07-08: qty 400

## 2018-07-08 MED ORDER — VANCOMYCIN HCL IN DEXTROSE 1-5 GM/200ML-% IV SOLN
1000.0000 mg | Freq: Once | INTRAVENOUS | Status: AC
  Administered 2018-07-08: 22:00:00 1000 mg via INTRAVENOUS
  Filled 2018-07-08: qty 200

## 2018-07-08 MED ORDER — SODIUM CHLORIDE 0.9 % IV SOLN: INTRAVENOUS | Status: DC | PRN

## 2018-07-08 MED ORDER — LACTATED RINGERS IV SOLN: 500.0000 mL | Freq: Once | INTRAVENOUS | Status: DC | PRN

## 2018-07-08 MED ORDER — MIDAZOLAM HCL 5 MG/5ML IJ SOLN
INTRAMUSCULAR | Status: DC | PRN
  Administered 2018-07-08: 1 mg via INTRAVENOUS
  Administered 2018-07-08: 3 mg via INTRAVENOUS

## 2018-07-08 MED ORDER — FENTANYL CITRATE (PF) 250 MCG/5ML IJ SOLN
INTRAMUSCULAR | Status: AC
  Filled 2018-07-08: qty 25

## 2018-07-08 MED ORDER — CHLORHEXIDINE GLUCONATE 0.12 % MT SOLN
15.0000 mL | OROMUCOSAL | Status: AC
  Administered 2018-07-08: 15 mL via OROMUCOSAL

## 2018-07-08 MED ORDER — ALBUMIN HUMAN 5 % IV SOLN
250.0000 mL | INTRAVENOUS | Status: DC | PRN
  Administered 2018-07-08: 12.5 g via INTRAVENOUS

## 2018-07-08 MED ORDER — ALBUMIN HUMAN 5 % IV SOLN
INTRAVENOUS | Status: DC | PRN
  Administered 2018-07-08 (×2): via INTRAVENOUS

## 2018-07-08 MED ORDER — SODIUM CHLORIDE 0.45 % IV SOLN: INTRAVENOUS | Status: DC | PRN

## 2018-07-08 MED ORDER — INSULIN ASPART 100 UNIT/ML ~~LOC~~ SOLN
0.0000 [IU] | SUBCUTANEOUS | Status: DC
  Administered 2018-07-08 – 2018-07-09 (×2): 2 [IU] via SUBCUTANEOUS

## 2018-07-08 MED ORDER — DEXMEDETOMIDINE HCL IN NACL 200 MCG/50ML IV SOLN: 0.0000 ug/kg/h | INTRAVENOUS | Status: DC

## 2018-07-08 MED ORDER — SUCCINYLCHOLINE CHLORIDE 200 MG/10ML IV SOSY
PREFILLED_SYRINGE | INTRAVENOUS | Status: AC
  Filled 2018-07-08: qty 10

## 2018-07-08 MED ORDER — MORPHINE SULFATE (PF) 2 MG/ML IV SOLN
1.0000 mg | INTRAVENOUS | Status: DC | PRN
  Administered 2018-07-08: 17:00:00 2 mg via INTRAVENOUS
  Filled 2018-07-08: qty 1

## 2018-07-08 MED ORDER — SODIUM CHLORIDE 0.9 % IV SOLN
INTRAVENOUS | Status: DC | PRN
  Administered 2018-07-08: 40 ug/min via INTRAVENOUS

## 2018-07-08 MED ORDER — ORAL CARE MOUTH RINSE
15.0000 mL | Freq: Two times a day (BID) | OROMUCOSAL | Status: DC
  Administered 2018-07-08 – 2018-07-13 (×9): 15 mL via OROMUCOSAL

## 2018-07-08 MED ORDER — DOCUSATE SODIUM 100 MG PO CAPS
200.0000 mg | ORAL_CAPSULE | Freq: Every day | ORAL | Status: DC
  Administered 2018-07-09 – 2018-07-11 (×3): 200 mg via ORAL
  Filled 2018-07-08 (×3): qty 2

## 2018-07-08 MED ORDER — CHLORHEXIDINE GLUCONATE CLOTH 2 % EX PADS
6.0000 | MEDICATED_PAD | Freq: Every day | CUTANEOUS | Status: DC
  Administered 2018-07-08 – 2018-07-13 (×6): 6 via TOPICAL

## 2018-07-08 MED ORDER — VANCOMYCIN HCL 1000 MG IV SOLR
INTRAVENOUS | Status: DC | PRN
  Administered 2018-07-08: 1000 mL

## 2018-07-08 MED ORDER — SODIUM CHLORIDE 0.9 % IV SOLN
1.5000 g | Freq: Two times a day (BID) | INTRAVENOUS | Status: DC
  Administered 2018-07-08 – 2018-07-10 (×4): 1.5 g via INTRAVENOUS
  Filled 2018-07-08 (×4): qty 1.5

## 2018-07-08 MED ORDER — SODIUM CHLORIDE 0.9 % IR SOLN
Status: DC | PRN
  Administered 2018-07-08: 3000 mL

## 2018-07-08 MED ORDER — HEPARIN SODIUM (PORCINE) 1000 UNIT/ML IJ SOLN
INTRAMUSCULAR | Status: AC
  Filled 2018-07-08: qty 1

## 2018-07-08 MED ORDER — BUPIVACAINE HCL (PF) 0.5 % IJ SOLN
INTRAMUSCULAR | Status: AC
  Filled 2018-07-08: qty 10

## 2018-07-08 MED ORDER — LACTATED RINGERS IV SOLN: INTRAVENOUS | Status: DC

## 2018-07-08 MED ORDER — BUPIVACAINE HCL (PF) 0.5 % IJ SOLN
INTRAMUSCULAR | Status: DC | PRN
  Administered 2018-07-08: 10 mL

## 2018-07-08 MED ORDER — SODIUM CHLORIDE 0.9 % IV SOLN
INTRAVENOUS | Status: DC | PRN
  Administered 2018-07-08: 0.2 ug/kg/h via INTRAVENOUS

## 2018-07-08 MED ORDER — ACETAMINOPHEN 650 MG RE SUPP
650.0000 mg | Freq: Once | RECTAL | Status: AC
  Administered 2018-07-08: 650 mg via RECTAL

## 2018-07-08 MED ORDER — FENTANYL CITRATE (PF) 100 MCG/2ML IJ SOLN
INTRAMUSCULAR | Status: DC | PRN
  Administered 2018-07-08: 150 ug via INTRAVENOUS
  Administered 2018-07-08: 100 ug via INTRAVENOUS

## 2018-07-08 MED ORDER — METOPROLOL TARTRATE 5 MG/5ML IV SOLN
2.5000 mg | INTRAVENOUS | Status: DC | PRN
  Administered 2018-07-09: 22:00:00 2.5 mg via INTRAVENOUS
  Filled 2018-07-08: qty 5

## 2018-07-08 MED ORDER — DEXAMETHASONE SODIUM PHOSPHATE 10 MG/ML IJ SOLN
INTRAMUSCULAR | Status: DC | PRN
  Administered 2018-07-08: 5 mg via INTRAVENOUS

## 2018-07-08 MED ORDER — SODIUM CHLORIDE (PF) 0.9 % IJ SOLN
INTRAMUSCULAR | Status: AC
  Filled 2018-07-08: qty 10

## 2018-07-08 MED ORDER — SODIUM CHLORIDE 0.9 % IV SOLN
INTRAVENOUS | Status: DC
  Administered 2018-07-08: 16:00:00 via INTRAVENOUS

## 2018-07-08 MED ORDER — PHENYLEPHRINE HCL-NACL 10-0.9 MG/250ML-% IV SOLN
INTRAVENOUS | Status: AC
  Filled 2018-07-08: qty 250

## 2018-07-08 MED ORDER — LACTATED RINGERS IV SOLN
INTRAVENOUS | Status: DC | PRN
  Administered 2018-07-08 (×2): via INTRAVENOUS

## 2018-07-08 MED ORDER — SODIUM CHLORIDE 0.9% FLUSH: 3.0000 mL | INTRAVENOUS | Status: DC | PRN

## 2018-07-08 MED ORDER — SODIUM CHLORIDE 0.9% FLUSH: 10.0000 mL | INTRAVENOUS | Status: DC | PRN

## 2018-07-08 MED ORDER — CHLORHEXIDINE GLUCONATE 0.12 % MT SOLN
15.0000 mL | Freq: Once | OROMUCOSAL | Status: AC
  Administered 2018-07-08: 07:00:00 15 mL via OROMUCOSAL
  Filled 2018-07-08: qty 15

## 2018-07-08 MED ORDER — HEPARIN SODIUM (PORCINE) 1000 UNIT/ML IJ SOLN
INTRAMUSCULAR | Status: DC | PRN
  Administered 2018-07-08: 27000 [IU] via INTRAVENOUS

## 2018-07-08 MED ORDER — TRAMADOL HCL 50 MG PO TABS: 50.0000 mg | ORAL_TABLET | ORAL | Status: DC | PRN

## 2018-07-08 MED ORDER — PLASMA-LYTE 148 IV SOLN
INTRAVENOUS | Status: DC | PRN
  Administered 2018-07-08: 10:00:00 via INTRAVASCULAR

## 2018-07-08 MED ORDER — ACETAMINOPHEN 160 MG/5ML PO SOLN: 650.0000 mg | Freq: Once | ORAL | Status: AC

## 2018-07-08 MED ORDER — OXYCODONE HCL 5 MG PO TABS
5.0000 mg | ORAL_TABLET | ORAL | Status: DC | PRN
  Administered 2018-07-08: 20:00:00 10 mg via ORAL
  Administered 2018-07-09: 5 mg via ORAL
  Filled 2018-07-08: qty 1
  Filled 2018-07-08: qty 2

## 2018-07-08 MED ORDER — ACETAMINOPHEN 500 MG PO TABS
1000.0000 mg | ORAL_TABLET | Freq: Four times a day (QID) | ORAL | Status: DC
  Administered 2018-07-08 – 2018-07-13 (×18): 1000 mg via ORAL
  Filled 2018-07-08 (×19): qty 2

## 2018-07-08 MED ORDER — FAMOTIDINE IN NACL 20-0.9 MG/50ML-% IV SOLN
20.0000 mg | Freq: Two times a day (BID) | INTRAVENOUS | Status: DC
  Administered 2018-07-08: 16:00:00 20 mg via INTRAVENOUS

## 2018-07-08 MED ORDER — SUGAMMADEX SODIUM 200 MG/2ML IV SOLN
INTRAVENOUS | Status: DC | PRN
  Administered 2018-07-08: 400 mg via INTRAVENOUS

## 2018-07-08 MED ORDER — PANTOPRAZOLE SODIUM 40 MG PO TBEC
40.0000 mg | DELAYED_RELEASE_TABLET | Freq: Every day | ORAL | Status: DC
  Administered 2018-07-10 – 2018-07-13 (×4): 40 mg via ORAL
  Filled 2018-07-08 (×4): qty 1

## 2018-07-08 MED ORDER — MAGNESIUM SULFATE 4 GM/100ML IV SOLN
4.0000 g | Freq: Once | INTRAVENOUS | Status: AC
  Administered 2018-07-08: 4 g via INTRAVENOUS
  Filled 2018-07-08: qty 100

## 2018-07-08 MED ORDER — 0.9 % SODIUM CHLORIDE (POUR BTL) OPTIME
TOPICAL | Status: DC | PRN
  Administered 2018-07-08: 4000 mL

## 2018-07-08 MED ORDER — SODIUM CHLORIDE 0.9% FLUSH: 3.0000 mL | Freq: Two times a day (BID) | INTRAVENOUS | Status: DC

## 2018-07-08 MED ORDER — SODIUM CHLORIDE 0.9 % IV SOLN
INTRAVENOUS | Status: DC | PRN
  Administered 2018-07-08: 12:00:00 via INTRAVENOUS

## 2018-07-08 MED ORDER — INSULIN REGULAR BOLUS VIA INFUSION
0.0000 [IU] | Freq: Three times a day (TID) | INTRAVENOUS | Status: DC
  Filled 2018-07-08: qty 10

## 2018-07-08 MED ORDER — SODIUM CHLORIDE 0.9 % IR SOLN
Status: DC | PRN
  Administered 2018-07-08: 1000 mL

## 2018-07-08 MED ORDER — PHENYLEPHRINE HCL-NACL 20-0.9 MG/250ML-% IV SOLN: 0.0000 ug/min | INTRAVENOUS | Status: DC

## 2018-07-08 MED ORDER — POTASSIUM CHLORIDE 10 MEQ/50ML IV SOLN
10.0000 meq | INTRAVENOUS | Status: AC
  Administered 2018-07-08 (×3): 10 meq via INTRAVENOUS

## 2018-07-08 MED ORDER — LACTATED RINGERS IV SOLN
INTRAVENOUS | Status: DC | PRN
  Administered 2018-07-08 (×2): via INTRAVENOUS

## 2018-07-08 MED ORDER — MIDAZOLAM HCL 2 MG/2ML IJ SOLN: 2.0000 mg | INTRAMUSCULAR | Status: DC | PRN

## 2018-07-08 MED ORDER — MIDAZOLAM HCL (PF) 10 MG/2ML IJ SOLN
INTRAMUSCULAR | Status: AC
  Filled 2018-07-08: qty 2

## 2018-07-08 MED ORDER — FENTANYL CITRATE (PF) 250 MCG/5ML IJ SOLN
INTRAMUSCULAR | Status: DC | PRN
  Administered 2018-07-08: 150 ug via INTRAVENOUS
  Administered 2018-07-08 (×2): 250 ug via INTRAVENOUS

## 2018-07-08 MED ORDER — SODIUM CHLORIDE 0.9 % IV SOLN: 250.0000 mL | INTRAVENOUS | Status: DC

## 2018-07-08 MED ORDER — ACETAMINOPHEN 160 MG/5ML PO SOLN: 1000.0000 mg | Freq: Four times a day (QID) | ORAL | Status: DC

## 2018-07-08 MED ORDER — ASPIRIN EC 325 MG PO TBEC
325.0000 mg | DELAYED_RELEASE_TABLET | Freq: Every day | ORAL | Status: DC
  Administered 2018-07-09: 325 mg via ORAL
  Filled 2018-07-08: qty 1

## 2018-07-08 MED ORDER — BISACODYL 5 MG PO TBEC
10.0000 mg | DELAYED_RELEASE_TABLET | Freq: Every day | ORAL | Status: DC
  Administered 2018-07-09 – 2018-07-11 (×3): 10 mg via ORAL
  Filled 2018-07-08 (×3): qty 2

## 2018-07-08 MED ORDER — NITROGLYCERIN IN D5W 200-5 MCG/ML-% IV SOLN: 0.0000 ug/min | INTRAVENOUS | Status: DC

## 2018-07-08 MED ORDER — ROCURONIUM BROMIDE 10 MG/ML (PF) SYRINGE
PREFILLED_SYRINGE | INTRAVENOUS | Status: AC
  Filled 2018-07-08: qty 20

## 2018-07-08 MED ORDER — ONDANSETRON HCL 4 MG/2ML IJ SOLN
4.0000 mg | Freq: Four times a day (QID) | INTRAMUSCULAR | Status: DC | PRN
  Administered 2018-07-10: 4 mg via INTRAVENOUS
  Filled 2018-07-08: qty 2

## 2018-07-08 MED ORDER — ASPIRIN 81 MG PO CHEW: 324.0000 mg | CHEWABLE_TABLET | Freq: Every day | ORAL | Status: DC

## 2018-07-08 MED ORDER — FENTANYL CITRATE (PF) 250 MCG/5ML IJ SOLN
INTRAMUSCULAR | Status: AC
  Filled 2018-07-08: qty 5

## 2018-07-08 MED ORDER — DEXAMETHASONE SODIUM PHOSPHATE 10 MG/ML IJ SOLN
INTRAMUSCULAR | Status: AC
  Filled 2018-07-08: qty 1

## 2018-07-08 MED ORDER — BISACODYL 10 MG RE SUPP: 10.0000 mg | Freq: Every day | RECTAL | Status: DC

## 2018-07-08 MED ORDER — SODIUM CHLORIDE 0.9 % IV SOLN: INTRAVENOUS | Status: DC

## 2018-07-08 MED ORDER — PROPOFOL 10 MG/ML IV BOLUS
INTRAVENOUS | Status: AC
  Filled 2018-07-08: qty 20

## 2018-07-08 MED ORDER — ONDANSETRON HCL 4 MG/2ML IJ SOLN
INTRAMUSCULAR | Status: AC
  Filled 2018-07-08: qty 2

## 2018-07-08 MED ORDER — LACTATED RINGERS IV SOLN
INTRAVENOUS | Status: DC | PRN
  Administered 2018-07-08: 08:00:00 via INTRAVENOUS

## 2018-07-08 MED ORDER — ONDANSETRON HCL 4 MG/2ML IJ SOLN
INTRAMUSCULAR | Status: DC | PRN
  Administered 2018-07-08: 4 mg via INTRAVENOUS

## 2018-07-08 MED ORDER — PROPOFOL 10 MG/ML IV BOLUS
INTRAVENOUS | Status: DC | PRN
  Administered 2018-07-08: 150 mg via INTRAVENOUS

## 2018-07-08 MED ORDER — PROTAMINE SULFATE 10 MG/ML IV SOLN
INTRAVENOUS | Status: DC | PRN
  Administered 2018-07-08 (×3): 50 mg via INTRAVENOUS

## 2018-07-08 SURGICAL SUPPLY — 106 items
ADAPTER CARDIO PERF ANTE/RETRO (ADAPTER) ×3 IMPLANT
BAG DECANTER FOR FLEXI CONT (MISCELLANEOUS) ×6 IMPLANT
BLADE STERNUM SYSTEM 6 (BLADE) ×3 IMPLANT
BLADE SURG 11 STRL SS (BLADE) ×3 IMPLANT
CANISTER SUCT 3000ML PPV (MISCELLANEOUS) ×6 IMPLANT
CANNULA FEM VENOUS REMOTE 22FR (CANNULA) ×3 IMPLANT
CANNULA FEMORAL ART 14 SM (MISCELLANEOUS) ×3 IMPLANT
CANNULA GUNDRY RCSP 15FR (MISCELLANEOUS) ×3 IMPLANT
CANNULA OPTISITE PERFUSION 16F (CANNULA) IMPLANT
CANNULA OPTISITE PERFUSION 18F (CANNULA) ×3 IMPLANT
CANNULA SUMP PERICARDIAL (CANNULA) ×6 IMPLANT
CATH CPB KIT OWEN (MISCELLANEOUS) IMPLANT
CATH KIT ON Q 5IN SLV (PAIN MANAGEMENT) ×3 IMPLANT
CELLS DAT CNTRL 66122 CELL SVR (MISCELLANEOUS) ×2 IMPLANT
CONN ST 1/4X3/8  BEN (MISCELLANEOUS) ×2
CONN ST 1/4X3/8 BEN (MISCELLANEOUS) ×4 IMPLANT
CONNECTOR 1/2X3/8X1/2 3 WAY (MISCELLANEOUS) ×1
CONNECTOR 1/2X3/8X1/2 3WAY (MISCELLANEOUS) ×2 IMPLANT
CONT SPEC 4OZ CLIKSEAL STRL BL (MISCELLANEOUS) ×3 IMPLANT
COVER BACK TABLE 24X17X13 BIG (DRAPES) ×3 IMPLANT
COVER WAND RF STERILE (DRAPES) IMPLANT
CRADLE DONUT ADULT HEAD (MISCELLANEOUS) ×3 IMPLANT
DERMABOND ADVANCED (GAUZE/BANDAGES/DRESSINGS) ×1
DERMABOND ADVANCED .7 DNX12 (GAUZE/BANDAGES/DRESSINGS) ×2 IMPLANT
DEVICE CLOSURE PERCLS PRGLD 6F (VASCULAR PRODUCTS) ×8 IMPLANT
DEVICE PMI PUNCTURE CLOSURE (MISCELLANEOUS) ×3 IMPLANT
DEVICE SUT CK QUICK LOAD INDV (Prosthesis & Implant Heart) ×12 IMPLANT
DEVICE SUT CK QUICK LOAD MINI (Prosthesis & Implant Heart) ×3 IMPLANT
DEVICE TROCAR PUNCTURE CLOSURE (ENDOMECHANICALS) ×3 IMPLANT
DRAIN CHANNEL 28F RND 3/8 FF (WOUND CARE) ×6 IMPLANT
DRAPE BILATERAL SPLIT (DRAPES) ×3 IMPLANT
DRAPE C-ARM 42X72 X-RAY (DRAPES) ×3 IMPLANT
DRAPE CV SPLIT W-CLR ANES SCRN (DRAPES) ×3 IMPLANT
DRAPE INCISE IOBAN 66X45 STRL (DRAPES) ×6 IMPLANT
DRAPE SLUSH/WARMER DISC (DRAPES) ×3 IMPLANT
DRSG AQUACEL AG ADV 3.5X10 (GAUZE/BANDAGES/DRESSINGS) ×3 IMPLANT
DRSG COVADERM 4X8 (GAUZE/BANDAGES/DRESSINGS) IMPLANT
ELECT BLADE 6.5 EXT (BLADE) ×3 IMPLANT
ELECT REM PT RETURN 9FT ADLT (ELECTROSURGICAL) ×6
ELECTRODE REM PT RTRN 9FT ADLT (ELECTROSURGICAL) ×4 IMPLANT
FELT TEFLON 1X6 (MISCELLANEOUS) ×3 IMPLANT
FEMORAL VENOUS CANN RAP (CANNULA) IMPLANT
GAUZE SPONGE 4X4 12PLY STRL LF (GAUZE/BANDAGES/DRESSINGS) ×3 IMPLANT
GLOVE BIO SURGEON STRL SZ 6 (GLOVE) ×6 IMPLANT
GLOVE BIO SURGEON STRL SZ 6.5 (GLOVE) ×6 IMPLANT
GLOVE BIOGEL PI IND STRL 6 (GLOVE) ×6 IMPLANT
GLOVE BIOGEL PI IND STRL 6.5 (GLOVE) ×6 IMPLANT
GLOVE BIOGEL PI INDICATOR 6 (GLOVE) ×3
GLOVE BIOGEL PI INDICATOR 6.5 (GLOVE) ×3
GLOVE ORTHO TXT STRL SZ7.5 (GLOVE) ×9 IMPLANT
GOWN STRL REUS W/ TWL LRG LVL3 (GOWN DISPOSABLE) ×14 IMPLANT
GOWN STRL REUS W/TWL LRG LVL3 (GOWN DISPOSABLE) ×7
KIT BASIN OR (CUSTOM PROCEDURE TRAY) ×3 IMPLANT
KIT DILATOR VASC 18G NDL (KITS) ×3 IMPLANT
KIT DRAINAGE VACCUM ASSIST (KITS) ×3 IMPLANT
KIT SUCTION CATH 14FR (SUCTIONS) ×3 IMPLANT
KIT SUT CK MINI COMBO 4X17 (Prosthesis & Implant Heart) ×3 IMPLANT
KIT TURNOVER KIT B (KITS) ×3 IMPLANT
LEAD PACING MYOCARDI (MISCELLANEOUS) ×6 IMPLANT
LINE VENT (MISCELLANEOUS) ×3 IMPLANT
NEEDLE AORTIC ROOT 14G 7F (CATHETERS) ×3 IMPLANT
NS IRRIG 1000ML POUR BTL (IV SOLUTION) ×12 IMPLANT
PACK E MIN INVASIVE VALVE (SUTURE) ×3 IMPLANT
PACK OPEN HEART (CUSTOM PROCEDURE TRAY) ×3 IMPLANT
PAD ARMBOARD 7.5X6 YLW CONV (MISCELLANEOUS) ×6 IMPLANT
PAD ELECT DEFIB RADIOL ZOLL (MISCELLANEOUS) ×3 IMPLANT
PERCLOSE PROGLIDE 6F (VASCULAR PRODUCTS) ×12
RING MITRAL MEMO 4D 30 (Prosthesis & Implant Heart) ×3 IMPLANT
RTRCTR WOUND ALEXIS 18CM MED (MISCELLANEOUS) ×3
SET CANNULATION TOURNIQUET (MISCELLANEOUS) ×3 IMPLANT
SET CARDIOPLEGIA MPS 5001102 (MISCELLANEOUS) ×3 IMPLANT
SET IRRIG TUBING LAPAROSCOPIC (IRRIGATION / IRRIGATOR) ×3 IMPLANT
SET MICROPUNCTURE 5F STIFF (MISCELLANEOUS) ×3 IMPLANT
SHEATH PINNACLE 6F 10CM (SHEATH) ×3 IMPLANT
SHEATH PINNACLE 8F 10CM (SHEATH) ×6 IMPLANT
SIZER CHORD-X CHORDAL CXCS (SIZER) ×3 IMPLANT
SOLUTION ANTI FOG 6CC (MISCELLANEOUS) ×3 IMPLANT
SUT BONE WAX W31G (SUTURE) ×3 IMPLANT
SUT ETHIBOND (SUTURE) ×6 IMPLANT
SUT ETHIBOND 2 0 SH (SUTURE) ×3 IMPLANT
SUT ETHIBOND 2-0 RB-1 WHT (SUTURE) ×6 IMPLANT
SUT ETHIBOND X763 2 0 SH 1 (SUTURE) ×3 IMPLANT
SUT GORETEX CV 4 TH 22 36 (SUTURE) IMPLANT
SUT GORETEX CV-5THC-13 36IN (SUTURE) ×18 IMPLANT
SUT GORETEX CV4 TH-18 (SUTURE) IMPLANT
SUT PROLENE 3 0 SH1 36 (SUTURE) ×15 IMPLANT
SUT PTFE CHORD X 20MM (SUTURE) ×3 IMPLANT
SUT SILK 2 0 SH CR/8 (SUTURE) IMPLANT
SUT SILK 3 0 SH CR/8 (SUTURE) IMPLANT
SUT VIC AB 2-0 CTX 36 (SUTURE) IMPLANT
SUT VIC AB 3-0 SH 8-18 (SUTURE) IMPLANT
SUT VICRYL 2 TP 1 (SUTURE) IMPLANT
SYR 10ML LL (SYRINGE) IMPLANT
SYSTEM SAHARA CHEST DRAIN ATS (WOUND CARE) ×3 IMPLANT
TAPE CLOTH SURG 4X10 WHT LF (GAUZE/BANDAGES/DRESSINGS) ×3 IMPLANT
TOWEL GREEN STERILE (TOWEL DISPOSABLE) ×3 IMPLANT
TOWEL GREEN STERILE FF (TOWEL DISPOSABLE) ×3 IMPLANT
TRAY FOLEY SLVR 16FR TEMP STAT (SET/KITS/TRAYS/PACK) IMPLANT
TROCAR XCEL BLADELESS 5X75MML (TROCAR) ×3 IMPLANT
TROCAR XCEL NON-BLD 11X100MML (ENDOMECHANICALS) ×6 IMPLANT
TUBE SUCT INTRACARD DLP 20F (MISCELLANEOUS) ×3 IMPLANT
TUNNELER SHEATH ON-Q 11GX8 DSP (PAIN MANAGEMENT) ×3 IMPLANT
UNDERPAD 30X30 (UNDERPADS AND DIAPERS) ×3 IMPLANT
WATER STERILE IRR 1000ML POUR (IV SOLUTION) ×6 IMPLANT
WIRE .035 3MM-J 145CM (WIRE) ×3 IMPLANT
WIRE EMERALD 3MM-J .035X150CM (WIRE) ×3 IMPLANT

## 2018-07-08 NOTE — Op Note (Signed)
CARDIOTHORACIC SURGERY OPERATIVE NOTE  Date of Procedure:  07/08/2018  Preoperative Diagnosis: Severe Mitral Regurgitation  Postoperative Diagnosis: Same  Procedure:    Minimally-Invasive Mitral Valve Repair  Complex valvuloplasty including triangular resection of P2 segment of posterior leaflet  Artificial Gore-tex neochord placement x6  Sorin Memo 4D Ring Annuloplasty (size 12mm, catalog # [id number], serial # [id number])    Surgeon: Valentina Gu. Roxy Manns, MD  Assistant: Enid Cutter, PA-C  Anesthesia: Albertha Ghee, MD  Operative Findings:  Fibroelastic deficiency type myxomatous degenerative disease  Multiple elongated chordae tendinae  Type II dysfunction with severe mitral regurgitation  Normal left ventricular systolic function  No residual mitral regurgitation after successful valve repair                     BRIEF CLINICAL NOTE AND INDICATIONS FOR SURGERY  Patient is a 68 year old male with history of hyperlipidemia and prostate cancer status post prostatectomy who has been referred for surgical consultation to discuss treatment options for management of recently discovered mitral valve prolapse with severe mitral regurgitation.  Patient states that he has been healthy all of his life.He was recently noted to have a heart murmur on physical exam by his primary care physician. An echocardiogram was performed documenting the presence of mitral valve prolapse with severe mitral regurgitation. The patient was referred to Dr. Percival Spanish and underwent transesophageal echocardiogram April 06, 2018 confirming the presence of mitral valve prolapse with moderate to severe mitral regurgitation. Cardiothoracic surgical consultation was requested. The patient has been seen in consultation and counseled at length regarding the indications, risks and potential benefits of surgery.  All questions have been answered, and the patient provides full informed consent for the  operation as described.    DETAILS OF THE OPERATIVE PROCEDURE  Preparation:  The patient is brought to the operating room on the above mentioned date and central monitoring was established by the anesthesia team including placement of Swan-Ganz catheter through the left internal jugular vein.  A radial arterial line is placed. The patient is placed in the supine position on the operating table.  Intravenous antibiotics are administered. General endotracheal anesthesia is induced uneventfully. The patient is initially intubated using a dual lumen endotracheal tube.  A Foley catheter is placed.  Baseline transesophageal echocardiogram was performed.  Findings were notable for myxomatous degenerative disease of the mitral valve with severe prolapse involving the posterior leaflet.  There was severe mitral regurgitation.  There was normal left ventricular size and systolic function.  No other significant abnormalities were noted.  A soft roll is placed behind the patient's left scapula and the neck gently extended and turned to the left.   The patient's right neck, chest, abdomen, both groins, and both lower extremities are prepared and draped in a sterile manner. A time out procedure is performed.   Percutaneous Vascular Access:  Percutaneous arterial and venous access were obtained on the right side.  Using ultrasound guidance the right common femoral vein was cannulated using the Seldinger technique and a pair of Perclose vascular closure devises were placed, after which time an 8 French sheath inserted.  The right common femoral artery was cannulated using a micropuncture wire and sheath.  A pair of Perclose vascular closure devices were placed at opposing 30 degree angles in the femoral artery, and a 8 French sheath inserted.  The right internal jugular vein was cannulated  using ultrasound guidance and an 8 French sheath inserted.     Surgical Approach:  A  right miniature anterolateral  thoracotomy incision is performed. The incision is placed just lateral to and superior to the right nipple. The pectoralis major muscle is retracted medially and completely preserved. The right pleural space is entered through the 4th intercostal space. A soft tissue retractor is placed.  Two 11 mm ports are placed through separate stab incisions inferiorly. The right pleural space is insufflated continuously with carbon dioxide gas through the posterior port during the remainder of the operation.  A pledgeted sutures placed through the dome of the right hemidiaphragm and retracted inferiorly to facilitate exposure.  A longitudinal incision is made in the pericardium 3 cm anterior to the phrenic nerve and silk traction sutures are placed on either side of the incision for exposure.   Extracorporeal Cardiopulmonary Bypass and Myocardial Protection:  The patient was heparinized systemically.  The right common femoral vein is cannulated through the venous sheath and a guidewire advanced into the right atrium using TEE guidance.  The femoral vein cannulated using a 22 Fr long femoral venous cannula.  The right common femoral artery is cannulated through the arterial sheath and a guidewire advanced into the descending thoracic aorta using TEE guidance.  Femoral artery is cannulated with a 18 French femoral arterial cannula.  The right internal jugular vein is cannulated through the venous sheath and a guidewire advanced into the right atrium.  The internal jugular vein is cannulated using a 14 French pediatric femoral venous cannula.   Adequate heparinization is verified.   The entire pre-bypass portion of the operation was notable for stable hemodynamics.  Cardiopulmonary bypass was begun.  Vacuum assist venous drainage is utilized. The incision in the pericardium is extended in both directions. Venous drainage and exposure are notably excellent. Attempts to place a retrograde cardioplegia cannula were  unsuccessful.  An antegrade cardioplegia cannula is placed in the ascending aorta.    The patient is cooled to 32C systemic temperature.  The aortic cross clamp is applied and cardioplegia is delivered initially in an antegrade fashion through the aortic root using modified [place] cold blood cardioplegia ([facility] blood cardioplegia protocol).   The initial cardioplegic arrest is rapid with early diastolic arrest.  Myocardial protection was felt to be excellent.   Mitral Valve Repair:  A left atriotomy incision was performed through the interatrial groove and extended partially across the back wall of the left atrium after opening the oblique sinus inferiorly.  The mitral valve is exposed using a self-retaining retractor.  The mitral valve was inspected and notable for fibroelastic deficiency type myxomatous degenerative disease.  There were multiple elongated chordae tendinae involving the middle scallop (P2) of the posterior leaflet.  There were no ruptured cords.  There was severe prolapse involving the P2 segment.  Interrupted 2-0 Ethibond horizontal mattress sutures are placed circumferentially around the entire mitral valve annulus. The sutures will ultimately be utilized for ring annuloplasty, and at this juncture there are utilized to suspend the valve symmetrically.  The P2 segment of the posterior leaflet was repaired using a simple triangular resection.  Approximately 20% of the total surface area of P2 was resected.  The intervening vertical defect in the leaflet was closed using simple interrupted everting CV 5 Gore-Tex suture.  Artificial neochord placement was performed using Chord-X multi-strand CV-4 Goretex pre-measured loops.  The appropriate cord length was measured from corresponding normal length primary cords from the P1 segment of the posterior leaflet. The papillary muscle suture of the Chord-X multi-strand suture was placed through the  head of the posterior papillary muscle  in a horizontal mattress fashion and tied over Teflon felt pledgets. Each of the three pre-measured loops were then reimplanted into the free margin of the P2 segment of the posterior leaflet.    The valve was tested with saline and appeared competent even without ring annuloplasty complete. The valve was sized to a 30 mm annuloplasty ring, based upon the transverse distance between the left and right commissures and the height of the anterior leaflet, corresponding to a size just slightly larger than the overall surface area of the anterior leaflet.  A Sorin Memo 4D annuloplasty ring (size 30 mm, catalog #[id number], serial #[id number]) was secured in place uneventfully. All ring sutures were secured using a Cor-knot device.    The valve was tested with saline and appeared competent. There is no residual leak. There was a broad, symmetrical line of coaptation of the anterior and posterior leaflet which was confirmed using the blue ink test.  Rewarming is begun.   Procedure Completion:  The atriotomy was closed using a 2-layer closure of running 3-0 Prolene suture after placing a sump drain across the mitral valve to serve as a left ventricular vent.  One final dose of warm retrograde "reanimation dose" cardioplegia was administered through the aortic root.  The aortic cross clamp was removed after a total cross clamp time of 104 minutes.  Epicardial pacing wires are fixed to the inferior wall of the right ventricule and to the right atrial appendage. The patient is rewarmed to 37C temperature. The left ventricular vent is removed.  The patient is ventilated and flow volumes turndown while the mitral valve repair is inspected using transesophageal echocardiogram. The valve repair appears intact with no residual leak. The antegrade cardioplegia cannula is now removed. The patient is weaned and disconnected from cardiopulmonary bypass.  The patient's rhythm at separation from bypass was AV paced.  The patient was  weaned from bypass without any inotropic support. Total cardiopulmonary bypass time for the operation was 146 minutes.  Followup transesophageal echocardiogram performed after separation from bypass revealed a well-seated annuloplasty ring in the mitral position with a normal functioning mitral valve. There was no residual leak.  Left ventricular function was unchanged from preoperatively.  The mean gradient across the mitral valve was estimated to be 3 mmHg.  The femoral arterial and venous cannulae were removed uneventfully. There was a palpable pulse in the distal right common femoral artery after removal of the cannula. Protamine was administered to reverse the anticoagulation. The right internal jugular cannula was removed and manual pressure held on the neck for 15 minutes.  Single lung ventilation was begun. The atriotomy closure was inspected for hemostasis. The pericardial sac was drained using a 28 French Bard drain placed through the anterior port incision.  The right pleural space is irrigated with saline solution and inspected for hemostasis.   The On-Q pain management system is utilized for postoperative analgesia.  A single lumen catheter is passed through the subcutaneous tissues from the anterior chest wall to the posterior port incision.  The catheter was then passed through the port incision into the pleural space and tunneled into the subpleural space posteriorly to cover the second through the sixth intercostal nerve roots.  The catheter was flushed with 0.5% bupivacaine solution and ultimately connected to a continuous infusion pump.  The right pleural space was drained using a 28 French Bard drain placed through the posterior port incision. The miniature thoracotomy incision was closed in  multiple layers in routine fashion. The right groin incision was inspected for hemostasis and closed in multiple layers in routine fashion.  The post-bypass portion of the operation was notable for  stable rhythm and hemodynamics.  No blood products were administered during the operation.   Disposition:  The patient tolerated the procedure well.  The patient was extubated in the operating room and subsequently transported to the surgical intensive care unit in stable condition. There were no intraoperative complications. All sponge instrument and needle counts are verified correct at completion of the operation.     Valentina Gu. Roxy Manns MD 07/08/2018 1:57 PM

## 2018-07-08 NOTE — Anesthesia Procedure Notes (Signed)
Central Venous Catheter Insertion Performed by: Duane Boston, MD, anesthesiologist Start/End6/04/2018 6:42 AM, 07/08/2018 6:52 AM Patient location: Pre-op. Preanesthetic checklist: patient identified, IV checked, site marked, risks and benefits discussed, surgical consent, monitors and equipment checked, pre-op evaluation, timeout performed and anesthesia consent Position: Trendelenburg Lidocaine 1% used for infiltration and patient sedated Hand hygiene performed , maximum sterile barriers used  and Seldinger technique used Catheter size: 8.5 Fr Total catheter length 8. PA cath was placed.Sheath introducer Swan type:thermodilution PA Cath depth:50 Procedure performed using ultrasound guided technique. Ultrasound Notes:anatomy identified, needle tip was noted to be adjacent to the nerve/plexus identified, no ultrasound evidence of intravascular and/or intraneural injection and image(s) printed for medical record Attempts: 1 Following insertion, line sutured and dressing applied. Post procedure assessment: free fluid flow, blood return through all ports and no air  Patient tolerated the procedure well with no immediate complications.

## 2018-07-08 NOTE — Interval H&P Note (Signed)
History and Physical Interval Note:  07/08/2018 5:82 AM  Brendan [name]  has presented today for surgery, with the diagnosis of MR.  The various methods of treatment have been discussed with the patient and family. After consideration of risks, benefits and other options for treatment, the patient has consented to  Procedure(s) with comments: B[place]te (MVR) (Right) - GLUTARALDEHYDE TRANSESOPHAGEAL ECHOCARDIOGRAM (TEE) (N/A) as a surgical intervention.  The patient's history has been reviewed, patient examined, no change in status, stable for surgery.  I have reviewed the patient's chart and labs.  Questions were answered to the patient's satisfaction.     [name]

## 2018-07-08 NOTE — Brief Op Note (Addendum)
07/08/2018  1:20 PM  PATIENT:  Brendan [name]  68 y.o. male  PRE-OPERATIVE DIAGNOSIS:  MR  POST-OPERATIVE DIAGNOSIS:  MR  PROCEDURE:  Procedure(s) with comments: MINIMALLY INVASIVE MITRAL VALVE REPAIR (MVR)  -Quadrangular resection of prolapsed portion of the posterior leaflet. -Posterior cord implant x 3 -MV annuloplasty with a 62mm Memo ring.  TRANSESOPHAGEAL ECHOCARDIOGRAM (TEE) (N/A)  SURGEON:  Surgeon(s) and Role:    * [name], MD - Primary  PHYSICIAN ASSISTANT: Roddenberry    ANESTHESIA:   general  EBL:  650 mL  BLOOD ADMINISTERED:none  DRAINS: Blake drain to the mediastinum  LOCAL MEDICATIONS USED:  OnQ Bupivicaine  SPECIMEN:  Source of Specimen:  Portion of posterior mitral valve leaflet  DISPOSITION OF SPECIMEN:  PATHOLOGY  COUNTS:  YES  DICTATION: .Dragon Dictation  PLAN OF CARE: Admit to inpatient   PATIENT DISPOSITION:  ICU - extubated and stable.   Delay start of Pharmacological VTE agent (>24hrs) due to surgical blood loss or risk of bleeding: yes

## 2018-07-08 NOTE — Progress Notes (Signed)
Patient ID: Brendan [name], male   DOB: [birth year], 68 y.o.   MRN: [id number]   TCTS Evening Rounds:   Hemodynamically stable  CI = 2.2  Extubated in OR  Urine output good  CT output low  CBC    Component Value Date/Time   WBC 16.4 (H) 07/08/2018 1508   RBC 4.13 (L) 07/08/2018 1508   HGB 12.2 (L) 07/08/2018 1635   HGB 14.8 03/30/2018 0915   HCT 36.0 (L) 07/08/2018 1635   HCT 43.1 03/30/2018 0915   PLT 127 (L) 07/08/2018 1508   PLT 162 03/30/2018 0915   MCV 90.3 07/08/2018 1508   MCV 88 03/30/2018 0915   MCH 30.0 07/08/2018 1508   MCHC 33.2 07/08/2018 1508   RDW 12.8 07/08/2018 1508   RDW 12.9 03/30/2018 0915   LYMPHSABS 1.3 07/22/2017 1025   MONOABS 0.4 07/22/2017 1025   EOSABS 0.2 07/22/2017 1025   BASOSABS 0.1 07/22/2017 1025     BMET    Component Value Date/Time   NA 141 07/08/2018 1635   NA 142 03/30/2018 0915   K 4.1 07/08/2018 1635   CL 109 07/03/2018 1144   CO2 21 (L) 07/03/2018 1144   GLUCOSE 113 (H) 07/08/2018 1328   BUN 14 07/03/2018 1144   BUN 11 03/30/2018 0915   CREATININE 0.89 07/03/2018 1144   CALCIUM 9.4 07/03/2018 1144   GFRNONAA >60 07/03/2018 1144   GFRAA >60 07/03/2018 1144     A/P:  Stable postop course. Continue current plans

## 2018-07-08 NOTE — Transfer of Care (Signed)
Immediate Anesthesia Transfer of Care Note  Patient: Brendan [name]  Procedure(s) Performed: MINIMALLY INVASIVE MITRAL VALVE REPAIR (MVR) (Right Chest) TRANSESOPHAGEAL ECHOCARDIOGRAM (TEE) (N/A )  Patient Location: ICU  Anesthesia Type:General  Level of Consciousness: drowsy  Airway & Oxygen Therapy: Patient Spontanous Breathing and Patient connected to face mask oxygen  Post-op Assessment: Report given to RN, Post -op Vital signs reviewed and stable and Patient moving all extremities  Post vital signs: Reviewed and stable  Last Vitals:  Vitals Value Taken Time  BP    Temp 35.7 C 07/08/2018  3:10 PM  Pulse 80 07/08/2018  3:10 PM  Resp 16 07/08/2018  3:10 PM  SpO2 100 % 07/08/2018  3:10 PM  Vitals shown include unvalidated device data.  Last Pain:  Vitals:   07/08/18 0657  TempSrc:   PainSc: 0-No pain         Complications: No apparent anesthesia complications

## 2018-07-08 NOTE — Anesthesia Procedure Notes (Signed)
Procedure Name: Intubation Date/Time: 07/08/2018 8:49 AM Performed by: Maryann Mccall T, CRNA Pre-anesthesia Checklist: Patient identified, Emergency Drugs available, Suction available and Patient being monitored Patient Re-evaluated:Patient Re-evaluated prior to induction Oxygen Delivery Method: Circle system utilized Preoxygenation: Pre-oxygenation with 100% oxygen Induction Type: IV induction and Rapid sequence Laryngoscope Size: Miller and 3 Grade View: Grade I Endobronchial tube: Left, Double lumen EBT, EBT position confirmed by auscultation and EBT position confirmed by fiberoptic bronchoscope and 39 Fr Number of attempts: 1 Airway Equipment and Method: Patient positioned with wedge pillow and Stylet Placement Confirmation: ETT inserted through vocal cords under direct vision,  positive ETCO2 and breath sounds checked- equal and bilateral Tube secured with: Tape Dental Injury: Teeth and Oropharynx as per pre-operative assessment

## 2018-07-08 NOTE — Anesthesia Procedure Notes (Signed)
Arterial Line Insertion Start/End6/04/2018 7:15 AM, 07/08/2018 7:30 AM Performed by: Lavell Luster, CRNA, CRNA  Preanesthetic checklist: patient identified, IV checked, risks and benefits discussed, surgical consent, monitors and equipment checked, pre-op evaluation and timeout performed Lidocaine 1% used for infiltration and patient sedated Right, radial was placed Catheter size: 20 G Hand hygiene performed , maximum sterile barriers used  and Seldinger technique used Allen's test indicative of satisfactory collateral circulation Attempts: 1 Procedure performed without using ultrasound guided technique. Following insertion, Biopatch and dressing applied. Post procedure assessment: normal  Patient tolerated the procedure well with no immediate complications.

## 2018-07-09 ENCOUNTER — Encounter (HOSPITAL_COMMUNITY): Payer: Self-pay | Admitting: Thoracic Surgery (Cardiothoracic Vascular Surgery)

## 2018-07-09 ENCOUNTER — Inpatient Hospital Stay (HOSPITAL_COMMUNITY): Payer: Medicare Other

## 2018-07-09 LAB — CBC
HCT: 36.5 % — ABNORMAL LOW (ref 39.0–52.0)
HCT: 36.3 % — ABNORMAL LOW (ref 39.0–52.0)
Hemoglobin: 12 g/dL — ABNORMAL LOW (ref 13.0–17.0)
Hemoglobin: 12.1 g/dL — ABNORMAL LOW (ref 13.0–17.0)
MCH: 29.6 pg (ref 26.0–34.0)
MCH: 30.2 pg (ref 26.0–34.0)
MCHC: 32.9 g/dL (ref 30.0–36.0)
MCHC: 33.3 g/dL (ref 30.0–36.0)
MCV: 89.9 fL (ref 80.0–100.0)
MCV: 90.5 fL (ref 80.0–100.0)
Platelets: 121 10*3/uL — ABNORMAL LOW (ref 150–400)
Platelets: 123 10*3/uL — ABNORMAL LOW (ref 150–400)
RBC: 4.06 MIL/uL — ABNORMAL LOW (ref 4.22–5.81)
RBC: 4.01 MIL/uL — ABNORMAL LOW (ref 4.22–5.81)
RDW: 12.8 % (ref 11.5–15.5)
RDW: 13.1 % (ref 11.5–15.5)
WBC: 13 10*3/uL — ABNORMAL HIGH (ref 4.0–10.5)
WBC: 14.5 10*3/uL — ABNORMAL HIGH (ref 4.0–10.5)
nRBC: 0 % (ref 0.0–0.2)
nRBC: 0 % (ref 0.0–0.2)

## 2018-07-09 LAB — BASIC METABOLIC PANEL
Anion gap: 8 (ref 5–15)
Anion gap: 5 (ref 5–15)
BUN: 10 mg/dL (ref 8–23)
BUN: 12 mg/dL (ref 8–23)
CO2: 22 mmol/L (ref 22–32)
CO2: 28 mmol/L (ref 22–32)
Calcium: 7.6 mg/dL — ABNORMAL LOW (ref 8.9–10.3)
Calcium: 8 mg/dL — ABNORMAL LOW (ref 8.9–10.3)
Chloride: 107 mmol/L (ref 98–111)
Chloride: 103 mmol/L (ref 98–111)
Creatinine, Ser: 0.95 mg/dL (ref 0.61–1.24)
Creatinine, Ser: 0.85 mg/dL (ref 0.61–1.24)
GFR calc Af Amer: >60 mL/min (ref >60)
GFR calc Af Amer: >60 mL/min (ref >60)
GFR calc non Af Amer: >60 mL/min (ref >60)
GFR calc non Af Amer: >60 mL/min (ref >60)
Glucose, Bld: 128 mg/dL — ABNORMAL HIGH (ref 70–99)
Glucose, Bld: 138 mg/dL — ABNORMAL HIGH (ref 70–99)
Potassium: 4.4 mmol/L (ref 3.5–5.1)
Potassium: 4.3 mmol/L (ref 3.5–5.1)
Sodium: 137 mmol/L (ref 135–145)
Sodium: 136 mmol/L (ref 135–145)

## 2018-07-09 LAB — POCT I-STAT 4, (NA,K, GLUC, HGB,HCT)
Glucose, Bld: 114 mg/dL — ABNORMAL HIGH (ref 70–99)
HCT: 36 % — ABNORMAL LOW (ref 39.0–52.0)
Hemoglobin: 12.2 g/dL — ABNORMAL LOW (ref 13.0–17.0)
Potassium: 4.1 mmol/L (ref 3.5–5.1)
Sodium: 141 mmol/L (ref 135–145)

## 2018-07-09 LAB — GLUCOSE, CAPILLARY
Glucose-Capillary: 116 mg/dL — ABNORMAL HIGH (ref 70–99)
Glucose-Capillary: 118 mg/dL — ABNORMAL HIGH (ref 70–99)
Glucose-Capillary: 127 mg/dL — ABNORMAL HIGH (ref 70–99)
Glucose-Capillary: 117 mg/dL — ABNORMAL HIGH (ref 70–99)
Glucose-Capillary: 83 mg/dL (ref 70–99)

## 2018-07-09 LAB — MAGNESIUM
Magnesium: 2.4 mg/dL (ref 1.7–2.4)
Magnesium: 2.3 mg/dL (ref 1.7–2.4)

## 2018-07-09 MED ORDER — ENOXAPARIN SODIUM 30 MG/0.3ML ~~LOC~~ SOLN
30.0000 mg | Freq: Every day | SUBCUTANEOUS | Status: DC
  Administered 2018-07-10 – 2018-07-12 (×3): 30 mg via SUBCUTANEOUS
  Filled 2018-07-09 (×3): qty 0.3

## 2018-07-09 MED ORDER — WARFARIN SODIUM 2.5 MG PO TABS
2.5000 mg | ORAL_TABLET | Freq: Every day | ORAL | Status: DC
  Administered 2018-07-09 – 2018-07-11 (×2): 2.5 mg via ORAL
  Filled 2018-07-09 (×2): qty 1

## 2018-07-09 MED ORDER — WARFARIN - PHYSICIAN DOSING INPATIENT
Freq: Every day | Status: DC
  Administered 2018-07-09 – 2018-07-12 (×3)

## 2018-07-09 MED ORDER — FUROSEMIDE 10 MG/ML IJ SOLN
20.0000 mg | Freq: Two times a day (BID) | INTRAMUSCULAR | Status: DC
  Administered 2018-07-09 – 2018-07-10 (×3): 20 mg via INTRAVENOUS
  Filled 2018-07-09 (×3): qty 2

## 2018-07-09 NOTE — Discharge Summary (Addendum)
Physician Discharge Summary  Patient ID: Brendan [name] MRN: [id number] DOB/AGE: [birth year] 70 y.o.  Admit date: 07/08/2018 Discharge date: 07/13/2018  Admission Diagnoses:  Mitral Valve Prolapse  Nonrheumatic mitral valve regurgitation Gastroesophageal reflux disease Dyslipidemia History of prostate cancer  Discharge Diagnoses:  Principal Problem:   S/P minimally invasive mitral valve repair Active Problems:   MVP (mitral valve prolapse)   Nonrheumatic mitral valve regurgitation Gastroesophageal reflux disease Dyslipidemia History of prostate cancer Expected acute blood loss anemia Post-op second degree type I heart block with resolution    Discharged Condition: good  CARDIOTHORACIC SURGERY HISTORY AND PHYSICAL EXAM  Referring Provider isHochrein, James, MD Primary Cardiologist isNo primary care provider on file. PCP isPlotnikov, [name], MD      Chief Complaint  Patient presents with  . Mitral Regurgitation    ECHO 03/17/18/TEE 04/06/18    HPI:  Patient is a 68 year old male with history of hyperlipidemia and prostate cancer status post prostatectomy who has been referred for surgical consultation to discuss treatment options for management of recently discovered mitral valve prolapse with severe mitral regurgitation.  Patient states that he has been healthy all of his life.He was recently noted to have a heart murmur on physical exam by his primary care physician. An echocardiogram was performed documenting the presence of mitral valve prolapse with severe mitral regurgitation. The patient was referred to Dr. Percival Spanish and underwent transesophageal echocardiogram April 06, 2018 confirming the presence of mitral valve prolapse with moderate to severe mitral regurgitation. Cardiothoracic surgical consultation was requested. The patient's initial consultation visit was postponed because of the ongoing COVID-19 pandemic. He was telephone recently and  requested face-to-face office visit because of increasing concerns relating to his underlying heart condition.  Patient is married and lives with his wife here in [place]. 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 He has remained reasonably active physically all of his adult life although he does not exercise on a regular basis. He does report symptoms of exertional shortness of breath that occur only with more strenuous physical exertion such as walking at a brisk pace or walking up a hill. He has recently discovered mild tendency to get short of breath when he lays flat in bed. He nowprops himself up on 1 or 2 pillows. He states that he occasionally wakes up at night feeling like he is gasping for his breath. He has not had any chest pain or chest tightness. He denies any lower extremity edema. He reports no palpitations, dizzy spells, or syncope. He denies any recent fevers or cough. He has not been around any persons with known or suspected COVID-19 infection.  Mitral valve repair was discussed with the patient in detail by Dr. Roxy Manns and the patient elected to proceed with surgery.   Hospital Course:  Mr. Boomhower was admitted for same-day surgery and after usual pre-operative preparation, was taken to the operating room where minimally invasive mitral valve repair was performed as detailed in the operative note below.  He tolerated the procedure well and separated from cardiopulmonary bypass without any inotropic agents.  He was awakened in the OR and extubated prior to transfer to the cardiovascular ICU where he remained hemodynamically stable. The PA catheter was removed on post-op day 1 and he was mobilized.  He was in a NSR.  He was felt surgically stable for transfer from the ICU to 4E for further convalescence on 07/10/2018. Chest tube output decreased and they were removed on  06/06. Follow up chest x ray showed no unexpected changes. . He  had bradycardia after midnight on 06/06 so he was put on backup VVI at 58. He briefly required pacing the afternoon of 06/06. He had no further bradycardia so epicardial pacing wires were removed on 06/08. Also, Lopressor was stopped on 06/07 due to bradycardia but his HR had improved to the 90's by the following day so a decision was made to resume metoprolol at 12.5mg  po BID. He was anticoagulated with Coumadin with dosing in the hospital of  2.5mg -2.5mg -5mg .  Plan to discharge on 5mg  PO daily.   Significant Diagnostic Studies:  ECHOCARDIOGRAM REPORT     Patient Name: JONANTHAN [name] Date of Exam: 03/17/2018 Medical Rec #: [id number] Height: 69.0 in Accession #: [id number] Weight: 171.0 lb Date of Birth: Aug 30, 1950 BSA: 1.93 m Patient Age: 68 years BP: 132/82 mmHg Patient Gender: M HR: 73 bpm. Exam Location: [facility]   Procedure: 2D Echo, 3D Echo, Cardiac Doppler and Color Doppler  Indications: R01.1 Murmur  History: Patient has no prior history of Echocardiogram examinations. Signs/Symptoms: Murmur; Risk Factors: Family History of Coronary Artery Disease and Dyslipidemia.  Sonographer: Deliah Boston RDCS Referring Phys: [place]   1. The left ventricle has normal systolic function of 24-23%. The cavity size was normal. Left ventricular diastolic Doppler parameters are consistent with pseudonormal Elevated left ventricular end-diastolic pressure. 2. The right ventricle has normal systolic function. The cavity was normal. There is no increase in right ventricular wall thickness. 3. Left atrial size was mildly dilated. 4. Right atrial size was mildly dilated. 5. Severe mitral valve prolapse. 6. 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 There is moderate thickening. Mitral  valve regurgitation is severe by color flow Doppler. The MR jet is anteriorly-directed. 7. The tricuspid valve is normal in structure. 8. The aortic valve is tricuspid There is mild thickening of the aortic valve. 9. The pulmonic valve was normal in structure. 10. There is bi-leaflet mitral valve prolapse posterior > anterior with anteriorly directed jet of mitral regurgitation that is severe. A TEE Is recommended.  FINDINGS Left Ventricle: The left ventricle has normal systolic function of 53-61%. The cavity size was normal. There is no increase in left ventricular wall thickness. Left ventricular diastolic Doppler parameters are consistent with pseudonormal (grade II)  Elevated left ventricular end-diastolic pressure Right Ventricle: The right ventricle has normal systolic function. The cavity was normal. There is no increase in right ventricular wall thickness. Left Atrium: left atrial size was mildly dilated Right Atrium: right atrial size was mildly dilated Interatrial Septum: No atrial level shunt detected by color flow Doppler. Pericardium: There is no evidence of pericardial effusion. Mitral Valve: The mitral valve is myxomatous. There is moderate thickening. Mitral valve regurgitation is severe by color flow Doppler. The MR jet is anteriorly-directed. There is severe holosystolic prolapse of of both mitral leaflets of the mitral  valve. Tricuspid Valve: The tricuspid valve is normal in structure. Tricuspid valve regurgitation is mild by color flow Doppler. Aortic Valve: The aortic valve is tricuspid There is mild thickening of the aortic valve. Aortic valve regurgitation was not visualized by color flow Doppler. Pulmonic Valve: The pulmonic valve was normal in structure. Pulmonic valve regurgitation is not visualized by color flow Doppler. Venous: The inferior vena cava is normal in size with greater than 50% respiratory variability. Additional Findings: There is bi-leaflet mitral  valve prolapse posterior > anterior with anteriorly directed jet of  mitral regurgitation that is severe. A TEE Is recommended.  LEFT VENTRICLE PLAX 2D (Teich) LV EF: 58.3 % Diastology LVIDd: 4.90 cm LV e' lateral: 11.70 cm/s LVIDs: 3.39 cm LV E/e' lateral: 12.3 LV PW: 0.81 cm LV e' medial: 13.10 cm/s LV IVS: 0.91 cm LV E/e' medial: 11.0 LVOT diam: 2.00 cm LV SV: 66 ml LVOT Area: 3.14 cm  RIGHT VENTRICLE RV S prime: 13.10 cm/s TAPSE (M-mode): 2.9 cm RVSP: 21.9 mmHg  LEFT ATRIUM Index RIGHT ATRIUM Index LA diam: 3.70 cm 1.91 cm/m RA Pressure: 3 mmHg LA Vol (A2C): 60.9 ml 31.51 ml/m RA Area: 18.60 cm LA Vol (A4C): 78.0 ml 40.35 ml/m RA Volume: 51.70 ml 26.75 ml/m LA Biplane Vol: 69.9 ml 36.16 ml/m AORTIC VALVE LVOT Vmax: 115.00 cm/s LVOT Vmean: 65.450 cm/s LVOT VTI: 0.204 m  AORTA Ao Root diam: 3.35 cm Ao Asc diam: 3.00 cm  MITRAL VALVE TR Peak grad: 18.9 mmHg MV Area (PHT): 3.06 cm TR Vmax: 250.00 cm/s MV Peak grad: 8.9 mmHg RVSP: 21.9 mmHg MV Mean grad: 3.0 mmHg MV Vmax: 1.49 m/s MV Vmean: 83.5 cm/s MV VTI: 0.36 m MV PHT: 72 msec MV Decel Time: 187 msec MR Peak grad: 78.9 mmHg MR Mean grad: 47.0 mmHg MR Vmax: 444.00 cm/s MR Vmean: 320.0 cm/s MR PISA: 12.32 MV E velocity: 144.00 cm/s MV A velocity: 109.00 cm/s MV E/A ratio: 1.32   Ena Dawley MD Electronically signed by Ena Dawley MD Signature Date/Time: 03/17/2018/5:04:46 PM      TRANSESOPHOGEAL ECHO REPORT   Patient Name: [name] Date of Exam: 04/06/2018 Medical Rec #: [id number] Height: 69.0 in Accession #: [id number] Weight: 171.0 lb Date of Birth: 08-Dec-1950 BSA: 1.93 m Patient Age: 60 years  BP: 110/53 mmHg Patient Gender: M HR: 63 bpm. Exam Location: Inpatient   Procedure: Transesophageal Echo  Indications: MR, MVP  History: Patient has prior history of Echocardiogram examinations, most recent 03/17/2018. Mitral Valve Disease and Mitral Valve Prolapse; Signs/Symptoms: Fever; Risk Factors: Dyslipidemia.  Sonographer: Mikki Santee RDCS (AE) Referring Phys: [place] Diagnosing Phys: Sanda Klein MD    PROCEDURE: The patient developed no complications during the procedure.  IMPRESSIONS   1. The left ventricle has normal systolic function, with an ejection fraction of 60-65%. The cavity size was normal. Left ventricular diastolic parameters were normal. No evidence of left ventricular regional wall motion abnormalities. 2. 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 There is particularly severe prolapse of the lateral scallop of the posterior leaflet (P1), moderate prolapse of the middle scallop of the posterior leaflet (P2). 3. The right ventricle has normal systolic function. The wall thickness and cavity size was normal. 4. Right ventricular systolic pressure is normal with an estimated pressure of 26 mmHg. 5. Moderate to severe mitral valve regurgitation. The jet is eccentric, anteriorly and medially directed. 6. Trivial aortic valve regurgitation. 7. The aortic root, ascending aorta, aortic arch and descending aorta are normal in size and structure.  FINDINGS Left Ventricle: The left ventricle has normal systolic function, with an ejection fraction of 60-65%. The cavity size was normal. There is no increase in left ventricular wall thickness. Left ventricular diastolic parameters were normal. No evidence of  left ventricular regional wall motion abnormalities. Right Ventricle: The right ventricle has normal systolic function.  The cavity size was normal. There is no increase in right ventricular wall thickness. Right ventricular systolic pressure is normal with an estimated pressure of 26.2 mmHg. Left Atrium: Left atrial size was  normal in size. Right Atrium: Right atrial size was normal in size. Right atrial pressure is estimated at 3 mmHg. Interatrial Septum: No atrial level shunt detected by color flow Doppler. Pericardium: There is no evidence of pericardial effusion. Mitral Valve: The mitral valve is myxomatous. Mild thickening of the mitral valve leaflet. Mitral valve regurgitation is moderate to severe by color flow Doppler. The MR jet is eccentric anteriorly and medially directed. No evidence of mitral valve  stenosis. There is severe mitral valve regurgitation by PISA measuring 8.34. There is severe prolapse of of both mitral leaflets of the mitral valve.There is particularly severe prolapse of the lateral scallop of the posterior leaflet (P1), moderately  severe prolapse of the middle scallop (P2). ERO area 0.34 cm sq, regurgitant volume 61 mL, regurgitant fraction 51% by PISA method. Regurgitant volume 40 mL by continuity equation. Vena contracta 5 mm. There is no flow reversal in either right or left  pulmonary veins. There is no mitral stenosis, the valve area is 6 cm sq. by planimetry. Tricuspid Valve: The tricuspid valve was normal in structure. Tricuspid valve regurgitation is mild by color flow Doppler. Aortic Valve: The aortic valve is normal in structure. Aortic valve regurgitation is trivial by color flow Doppler. Pulmonic Valve: The pulmonic valve was normal in structure. Pulmonic valve regurgitation is trivial by color flow Doppler. Aorta: The aortic root, ascending aorta and aortic arch are normal in size and structure.  LEFT VENTRICLE PLAX 2D (Teich) LV EF: 71.0 % LVIDd: 5.60 cm LVIDs: 3.31 cm LVOT diam: 2.05 cm LV SV: 109 ml LVOT Area: 3.30 cm   RIGHT VENTRICLE RVSP: 26.2 mmHg  RIGHT ATRIUM RA Pressure: 3 mmHg AORTIC VALVE LVOT Vmax: 105.00 cm/s LVOT Vmean: 68.700 cm/s LVOT VTI: 0.183 m  MITRAL VALVE TRICUSPID VALVE MV Area (planimetry): 6.00 cm TR Peak grad: 23.2 mmHg MV VTI: 0.17 m TR Vmax: 241.00 cm/s MR Peak grad: 127.5 mmHg RVSP: 26.2 mmHg MR Mean grad: 98.0 mmHg MR Vmax: 564.67 cm/s MR Vmean: 464.0 cm/s MR PISA: ERO 0.34 cm2 MR Vena Contracta: 0.50 cm   Mihai Croitoru MD Electronically signed by Sanda Klein MD Signature Date/Time: 04/06/2018/2:04:31 PM   RIGHT/LEFT HEART CATH AND CORONARY ANGIOGRAPHY  Conclusion   1. Widely patent coronary arteries with minimal irregularity of the mid LAD, otherwise angiographically normal vessels 2. Normal cardiac hemodynamics with the exception of prominent V waves in the pulmonary wedge position consistent with hemodynamically significant mitral insufficiency  Recommendations: Continue evaluation for mitral valve repair  Indications   Severe mitral insufficiency [I34.0 (ICD-10-CM)]  Procedural Details   Technical Details INDICATION: Severe mitral regurgitation/preoperative cath study in preparation for mitral valve repair  PROCEDURAL DETAILS: There was an indwelling IV in a right antecubital vein. Using normal sterile technique, the IV was changed out for a 5 Fr brachial sheath over a 0.018 inch wire. The right wrist was then prepped, draped, and anesthetized with 1% lidocaine. Using the modified Seldinger technique a 5/6 French Slender sheath was placed in the right radial artery. Intra-arterial verapamil was administered through the radial artery sheath. IV heparin was administered after a JR4 catheter was advanced into the central aorta. A Swan-Ganz catheter was used for the right heart catheterization. Standard protocol was followed for  recording of right heart pressures and sampling of oxygen saturations. Fick cardiac output was calculated. Standard Judkins catheters were used for selective coronary angiography. LV pressure is recorded and an aortic valve pullback is performed. There were no immediate procedural complications.  The patient was transferred to the post catheterization recovery area for further monitoring.    Estimated blood loss <50 mL.   During this procedure medications were administered to achieve and maintain moderate conscious sedation while the patient's heart rate, blood pressure, and oxygen saturation were continuously monitored and I was present face-to-face 100% of this time.  Medications  (Filter: Administrations occurring from 06/17/18 0814 to 06/17/18 0916)          Medication Rate/Dose/Volume Action  Date Time   fentaNYL (SUBLIMAZE) injection (mcg) 25 mcg Given 06/17/18 0828   Total dose as of 07/06/18 1216        25 mcg        midazolam (VERSED) injection (mg) 2 mg Given 06/17/18 0828   Total dose as of 07/06/18 1216        2 mg        lidocaine (PF) (XYLOCAINE) 1 % injection (mL) 2 mL Given 06/17/18 0843   Total dose as of 07/06/18 1216 2 mL Given 0845   4 mL        Radial Cocktail/Verapamil only (mL) 10 mL Given 06/17/18 0845   Total dose as of 07/06/18 1216        10 mL        Heparin (Porcine) in NaCl 1000-0.9 UT/500ML-% SOLN (mL) 500 mL Given 06/17/18 0852   Total dose as of 07/06/18 1216 500 mL Given 0852   1,000 mL        heparin injection (Units) 3,500 Units Given 06/17/18 0855   Total dose as of 07/06/18 1216        3,500 Units        iohexol (OMNIPAQUE) 350 MG/ML injection (mL) 40 mL Given 06/17/18 0907   Total dose as of 07/06/18 1216        40 mL        Sedation Time   Sedation Time Physician-1: 37 minutes 56 seconds  Coronary Findings   Diagnostic  Dominance: Co-dominant  Left  Anterior Descending  The vessel exhibits minimal luminal irregularities.  First Septal Branch  The LAD is widely patent throughout its course. The diagonal branches are patent with no obstruction. The LAD reaches the LV apex. There is minimal irregularity in the midportion of the vessel.  Left Circumflex  Vessel is angiographically normal. The circumflex is a large vessel with no obstructive disease. The vessel is codominant with the RCA.  Right Coronary Artery  Vessel is small. Vessel is angiographically normal. Small, codominant vessel with no significant obstruction.  Intervention   No interventions have been documented.  Right Heart   Right Heart Pressures Hemodynamic findings consistent with mitral valve regurgitation. LV EDP is normal.  Coronary Diagrams   Diagnostic  Dominance: Co-dominant   Intervention   Implants    No implant documentation for this case.  Syngo Images   Show images for CARDIAC CATHETERIZATION  Images on Long Term Storage   Show images for Dever, Sabien Umland "[name]"   Link to Procedure Log   Procedure Log    Hemo Data    Most Recent Value  Fick Cardiac Output 6.04 L/min  Fick Cardiac Output Index 3.17 (L/min)/BSA  RA A Wave 6 mmHg  RA V Wave 2 mmHg  RA Mean 2 mmHg  RV Systolic Pressure 24 mmHg  RV Diastolic Pressure 0 mmHg  RV EDP 5 mmHg  PA Systolic Pressure 23 mmHg  PA Diastolic Pressure 7 mmHg  PA Mean 14 mmHg  PW A Wave 13 mmHg  PW V Wave 18 mmHg  PW Mean 9 mmHg  AO Systolic Pressure 673 mmHg  AO Diastolic Pressure 62 mmHg  AO Mean 85 mmHg  LV Systolic Pressure 419 mmHg  LV Diastolic Pressure 1 mmHg  LV EDP 12 mmHg  AOp Systolic Pressure 379 mmHg  AOp Diastolic Pressure 62 mmHg  AOp Mean Pressure 85 mmHg  LVp Systolic Pressure 024 mmHg  LVp Diastolic Pressure 0 mmHg  LVp EDP Pressure 9 mmHg  QP/QS 1  TPVR Index 4.41 HRUI  TSVR Index 27.44 HRUI  PVR SVR Ratio 0.06  TPVR/TSVR Ratio 0.16      CT  ANGIOGRAPHY CHEST, ABDOMEN AND PELVIS  TECHNIQUE: Multidetector CT imaging through the chest, abdomen and pelvis was performed using the standard protocol during bolus administration of intravenous contrast. Multiplanar reconstructed images and MIPs were obtained and reviewed to evaluate the vascular anatomy.  CONTRAST: 4mL ISOVUE-370 IOPAMIDOL (ISOVUE-370) INJECTION 76%  COMPARISON: Chest radiograph April 11, 2009  FINDINGS: CTA CHEST FINDINGS  Cardiovascular: There is no thoracic aortic aneurysm or dissection. No appreciable thoracic aortic atherosclerotic changes noted. Visualized great vessels appear normal. There is no demonstrable pulmonary embolus. There is no evident pericardial effusion or pericardial thickening. The overall cardiac chamber sizes appear within normal limits. No mitral valve calcification evident.  Mediastinum/Nodes: Thyroid appears unremarkable. There is no evident thoracic adenopathy. No esophageal lesions are appreciable.  Lungs/Pleura: There is no evident edema or consolidation. On axial slice 73 series 5, there is a 5 mm nodular opacity arising from the posterior aspect of the superior segment of the right lower lobe. On axial slice 097 series 5, there is a nodular opacity in the anterior segment of the right upper lobe measuring 7 x 6 mm. No evident pleural effusion or pleural thickening.  Musculoskeletal: There is degenerative change in the thoracic spine. There are no blastic or lytic bone lesions. There is no evident chest wall lesion. There is a small foramen of Bochdalek hernia on the left posteriorly containing only fat.  Review of the MIP images confirms the above findings.  CTA ABDOMEN AND PELVIS FINDINGS  VASCULAR  Aorta: There is no abdominal aortic aneurysm or dissection. No appreciable abdominal aortic atherosclerotic change.  Celiac: The celiac artery is mildly tortuous. The celiac artery and its branches appear  widely patent without appreciable atherosclerotic plaque. No aneurysm or dissection involving the celiac artery and its branches.  SMA: Superior mesenteric artery and its branches are widely patent. No aneurysm or dissection evident.  Renals: There is a single renal artery on the left. There are 2 renal arteries arising from the aorta. The renal arteries and their branches appear widely patent in all segments. No aneurysm or dissection. No evident fibromuscular dysplasia.  IMA: Inferior mesenteric artery and its branches appear widely patent. No aneurysm or dissection.  Inflow: The pelvic arterial vessels are widely patent throughout their respective courses. No aneurysm or dissection is evident. Proximal superficial and profunda femoral artery branches are widely patent without aneurysm or dissection.  Veins: No obvious venous abnormality within the limitations of this arterial phase study.  Review of the MIP images confirms the above findings.  NON-VASCULAR  Hepatobiliary: There is a benign-appearing lesion in the posterior segment of the right lobe of the liver near the dome measuring 2.6 x 2.0 cm. It has attenuation values slightly higher than is expected with a cyst. There is slight increase in attenuation in this lesion on delayed imaging. It  does not have typical hemangioma appearance. No other focal liver lesions are evident. Gallbladder wall is not appreciably thickened. No biliary duct dilatation evident.  Pancreas: There is no pancreatic mass or inflammatory focus.  Spleen: No splenic lesions are evident.  Adrenals/Urinary Tract: Adrenals bilaterally appear unremarkable. Kidneys bilaterally show no evident mass or hydronephrosis on either side. There is no demonstrable renal or ureteral calculus on either side. Urinary bladder is midline with wall thickness within normal limits.  Stomach/Bowel: There is no appreciable bowel wall or mesenteric  thickening. There is moderate stool throughout the colon. No evident bowel obstruction. Terminal ileum appears unremarkable.  Lymphatic: No evident adenopathy in the abdomen or pelvis.  Reproductive: Prostate absent. No evident pelvic mass.  Other: Appendix appears unremarkable. No demonstrable abscess or ascites in the abdomen or pelvis.  Musculoskeletal: There is disc degeneration at L3-4. No blastic or lytic bone lesions are evident. No intramuscular or abdominal wall lesions are appreciable.  Review of the MIP images confirms the above findings.  IMPRESSION: CT angiogram chest:  1. No thoracic aortic aneurysm or dissection. No appreciable aortic atherosclerosis.  2. No demonstrable pulmonary embolus.  3. Nodular opacities in the right lung, largest measuring 7 x 6 mm. Non-contrast chest CT at 6-12 months is recommended. If the nodule is stable at time of repeat CT, then future CT at 18-24 months (from today's scan) is considered optional for low-risk patients, but is recommended for high-risk patients. This recommendation follows the consensus statement: Guidelines for Management of Incidental Pulmonary Nodules Detected on CT Images: From the Fleischner Society 2017; Radiology 2017; 284:228-243.  4. No demonstrable thoracic adenopathy.  CT angiogram abdomen; CT angiogram pelvis:  1. No appreciable aneurysm or dissection involving the aorta, major mesenteric, and major pelvic arterial vessels. No appreciable atherosclerotic plaque noted in these vessels. No stenosis evident in these vessels.  2. Benign-appearing lesion in the right lobe of the liver near the dome posteriorly. This lesion does not represent a typical hemangioma. If further imaging is felt to be advisable, pre and serial post-contrast MR or CT of the liver could be helpful to further evaluate.  3. Prostate absent.  4. No bowel obstruction. No abscess in the abdomen or pelvis.  Appendix appears normal.  5. No evident renal or ureteral calculus. No hydronephrosis. Urinary bladder wall thickness normal.   Electronically Signed By: Lowella Grip III M.D. On: 07/03/2018 08:43  Treatments: Surgery  CARDIOTHORACIC SURGERY OPERATIVE NOTE  Date of Procedure:                07/08/2018  Preoperative Diagnosis:      Severe Mitral Regurgitation  Postoperative Diagnosis:    Same  Procedure:        Minimally-Invasive Mitral Valve Repair             Complex valvuloplasty including triangular resection of P2 segment of posterior leaflet             Artificial Gore-tex neochord placement x6             Sorin Memo 4D Ring Annuloplasty (size 11mm, catalog # [id number], serial # [id number])               Surgeon:        Valentina Gu. Roxy Manns, MD  Assistant:       Enid Cutter, PA-C  Anesthesia:    Albertha Ghee, MD  Operative Findings: ? Fibroelastic deficiency type myxomatous degenerative disease ? Multiple elongated chordae tendinae ? Type II  dysfunction with severe mitral regurgitation ? Normal left ventricular systolic function ? No residual mitral regurgitation after successful valve repair                     BRIEF CLINICAL NOTE AND INDICATIONS FOR SURGERY  Patient is a 67 year old male with history of hyperlipidemia and prostate cancer status post prostatectomy who has been referred for surgical consultation to discuss treatment options for management of recently discovered mitral valve prolapse with severe mitral regurgitation.  Patient states that he has been healthy all of his life.He was recently noted to have a heart murmur on physical exam by his primary care physician. An echocardiogram was performed documenting the presence of mitral valve prolapse with severe mitral regurgitation. The patient was referred to Dr. Percival Spanish and underwent transesophageal echocardiogram April 06, 2018 confirming the  presence of mitral valve prolapse with moderate to severe mitral regurgitation. Cardiothoracic surgical consultation was requested. The patient has been seen in consultation and counseled at length regarding the indications, risks and potential benefits of surgery.  All questions have been answered, and the patient provides full informed consent for the operation as described.   Discharge Exam: Blood pressure 138/73, pulse 91, temperature 97.7 F (36.5 C), temperature source Oral, resp. rate 19, height 5\' 9"  (1.753 m), weight 73.8 kg, SpO2 99 %.  General appearance: alert, cooperative and no distress Neurologic: intact Heart: regular rate and rhythm Lungs: clear to auscultation bilaterally Abdomen: soft and non-tender Wound: Right chest incision intact and dry. Right groin vascular access site has minor bruising, no evidence of hematoma.   Disposition:   Discharge Instructions    Amb Referral to Cardiac Rehabilitation   Complete by:  As directed    Diagnosis:  Valve Repair   Valve:  Mitral Comment - minimally   After initial evaluation and assessments completed: Virtual Based Care may be provided alone or in conjunction with Phase 2 Cardiac Rehab based on patient barriers.:  Yes     Allergies as of 07/13/2018      Reactions   Hydrocodone Other (See Comments)   chills      Medication List    TAKE these medications   acetaminophen 500 MG tablet Commonly known as:  TYLENOL Take 500 mg by mouth every 6 (six) hours as needed (pain.).   aspirin 81 MG EC tablet Take 1 tablet (81 mg total) by mouth daily.   chlorpheniramine 4 MG tablet Commonly known as:  CHLOR-TRIMETON Take 4 mg by mouth daily.   glycopyrrolate 2 MG tablet Commonly known as:  ROBINUL Take 1 tablet (2 mg total) by mouth daily.   lansoprazole 30 MG capsule Commonly known as:  PREVACID Take 30 mg by mouth daily before breakfast.   lovastatin 20 MG tablet Commonly known as:  MEVACOR Take 1 tablet (20 mg  total) by mouth daily. What changed:  when to take this   metoprolol tartrate 25 MG tablet Commonly known as:  LOPRESSOR Take 0.5 tablets (12.5 mg total) by mouth 2 (two) times daily.   traMADol 50 MG tablet Commonly known as:  ULTRAM Take 1-2 tablets (50-100 mg total) by mouth every 4 (four) hours as needed for up to 5 days for moderate pain.   warfarin 5 MG tablet Commonly known as:  COUMADIN Take 1 tablet (5 mg total) by mouth daily at 6 PM. Or as instructed by the [facility].      Follow-up Information    [name], MD.  Go on 07/29/2018.   Specialty:  Cardiothoracic Surgery Why:  PA/LAT CXR to be taken (at [facility] which is in the same building as Dr. Guy Sandifer office) on 06/24 at 1:30 pm;Appointment time is at 2:00 pm Contact information: [address] [place] [address] [place] Office Follow up.   Specialty:  Cardiology Why:  You have an appointment for a PT / INR blood test at the [facility] on Thursday 07/16/18 at 10:30am.  Contact information: [address], [address] [place]       Lendon Colonel, NP Follow up on 07/28/2018.   Specialties:  Nurse Practitioner, Radiology, Cardiology Why:  You have been scheduled for a virtual (telephone) office visit with your cardiology team on Tuesday 07/28/18 at 1:30. Contact information: [address] [place] [address] [phone]          The patient has been discharged on:   1.Beta Blocker:  Yes [ x  ]                              No   [   ]                              If No, reason:  2.Ace Inhibitor/ARB: Yes [   ]                                     No  [ x   ]                                     If No, reason: Not indicated  3.Statin:   Yes [ x  ]                  No  [   ]                  If No, reason:  4.Ecasa:  Yes  [ x  ]                  No   [   ]                   If No, reason:    Signed: Antony Odea PA-C 07/13/2018, 9:42 AM

## 2018-07-09 NOTE — Progress Notes (Signed)
Patient ID: Brendan [name], male   DOB: [birth year], 68 y.o.   MRN: [id number] EVENING ROUNDS NOTE :     [address]       [place] [address]             [phone]                 1 Day Post-Op Procedure(s) (LRB): MINIMALLY INVASIVE MITRAL VALVE REPAIR (MVR) (Right) TRANSESOPHAGEAL ECHOCARDIOGRAM (TEE) (N/A)  Total Length of Stay:  LOS: 1 day  BP 130/64   Pulse 64   Temp 98.3 F (36.8 C) (Oral)   Resp 20   Ht 5\' 9"  (1.753 m)   Wt 78.5 kg   SpO2 94%   BMI 25.56 kg/m   .Intake/Output      06/03 0701 - 06/04 0700 06/04 0701 - 06/05 0700   P.O.  240   I.V. (mL/kg) 4047.2 (51.6) 108.1 (1.4)   IV Piggyback 1290.3    Total Intake(mL/kg) 5337.5 (68) 348.1 (4.4)   Urine (mL/kg/hr) 5800 (3.1) 625 (0.8)   Blood 650    Chest Tube 340 180   Total Output 6790 805   Net -1452.5 -456.9          . sodium chloride 1 mL/hr at 07/09/18 0554  . cefUROXime (ZINACEF)  IV 1.5 g (07/09/18 0551)  . lactated ringers 10 mL/hr at 07/08/18 1700  . lactated ringers       Lab Results  Component Value Date   WBC 14.5 (H) 07/09/2018   HGB 12.1 (L) 07/09/2018   HCT 36.3 (L) 07/09/2018   PLT 123 (L) 07/09/2018   GLUCOSE 138 (H) 07/09/2018   CHOL 149 07/22/2017   TRIG 81.0 07/22/2017   HDL 55.20 07/22/2017   LDLCALC 77 07/22/2017   ALT 14 07/03/2018   AST 20 07/03/2018   NA 136 07/09/2018   K 4.3 07/09/2018   CL 103 07/09/2018   CREATININE 0.85 07/09/2018   BUN 12 07/09/2018   CO2 28 07/09/2018   TSH 1.02 07/22/2017   PSA 0.01 (L) 07/22/2017   INR 1.4 (H) 07/08/2018   HGBA1C 5.1 07/03/2018    Stable day   Grace Isaac MD  Beeper [phone] Office [phone] 07/09/2018 5:32 PM

## 2018-07-09 NOTE — Plan of Care (Signed)
Patient alert, and oriented.  Monitoring as per protocol.  States pain is mainly in axillary region on right side.  Performed ISP without minimal difficulty, attaining [phone].  Changed to [place] 3L.  Tolerating well.

## 2018-07-09 NOTE — Progress Notes (Addendum)
TCTS DAILY ICU PROGRESS NOTE                   [address]            [place] [address]          [phone]   1 Day Post-Op Procedure(s) (LRB): MINIMALLY INVASIVE MITRAL VALVE REPAIR (MVR) (Right) TRANSESOPHAGEAL ECHOCARDIOGRAM (TEE) (N/A)  Total Length of Stay:  LOS: 1 day   Subjective: Awake and alert.  Sitting up in bed having clear liquid breakfast.  Says he "feels much better".  Michela Pitcher he is only taking Tylenol for pain this morning and this has been sufficient.  Objective: Vital signs in last 24 hours: Temp:  [96.3 F (35.7 C)-99.9 F (37.7 C)] 99.3 F (37.4 C) (06/04 0700) Pulse Rate:  [61-80] 62 (06/04 0700) Cardiac Rhythm: Atrial paced (06/03 1927) Resp:  [6-25] 17 (06/04 0700) BP: (104-132)/(60-79) 126/68 (06/04 0700) SpO2:  [96 %-100 %] 100 % (06/04 0700) Arterial Line BP: (121-156)/(50-84) 148/57 (06/04 0700) FiO2 (%):  [45 %-50 %] 45 % (06/03 1637) Weight:  [78.5 kg] 78.5 kg (06/04 0500)  Filed Weights   07/08/18 0642 07/09/18 0500  Weight: 77.1 kg 78.5 kg    Weight change: 1.389 kg   Hemodynamic parameters for last 24 hours: PAP: (19-33)/(2-12) 26/7 CO:  [3.7 L/min-4.3 L/min] 4 L/min CI:  [1.9 L/min/m2-2.2 L/min/m2] 2.1 L/min/m2  Intake/Output from previous day: 06/03 0701 - 06/04 0700 In: 5337.5 [I.V.:4047.2; IV Piggyback:1290.3] Out: 6790 [Urine:5800; Blood:650; Chest Tube:340]  Intake/Output this shift: No intake/output data recorded.  Current Meds: Scheduled Meds: . acetaminophen  1,000 mg Oral Q6H  . aspirin EC  325 mg Oral Daily  . bisacodyl  10 mg Oral Daily   Or  . bisacodyl  10 mg Rectal Daily  . Chlorhexidine Gluconate Cloth  6 each Topical Daily  . docusate sodium  200 mg Oral Daily  . [START ON 07/10/2018] enoxaparin (LOVENOX) injection  30 mg Subcutaneous QHS  . insulin aspart  0-24 Units Subcutaneous Q4H  . mouth rinse  15 mL Mouth Rinse BID  . [START ON 07/10/2018] pantoprazole  40 mg Oral Daily  . sodium  chloride flush  10-40 mL Intracatheter Q12H  . warfarin  2.5 mg Oral q1800  . Warfarin - Physician Dosing Inpatient   Does not apply q1800   Continuous Infusions: . sodium chloride 1 mL/hr at 07/09/18 0554  . albumin human 12.5 g (07/08/18 2006)  . cefUROXime (ZINACEF)  IV 1.5 g (07/09/18 0551)  . lactated ringers 10 mL/hr at 07/08/18 1700  . lactated ringers     PRN Meds:.albumin human, metoprolol tartrate, morphine injection, ondansetron (ZOFRAN) IV, oxyCODONE, sodium chloride flush, traMADol  General appearance: alert, cooperative and no distress Neurologic: intact Heart: regular rate and rhythm Lungs: clear to auscultation bilaterally. Chest: Mediastinum and right pleural tube were secured.  Chest tube drainage 340 mls since surgery but is tapering off  Abdomen: Soft and nontender Wound: Right chest incision is covered with surgical dressing remains dry and intact.  Lab Results: CBC: Recent Labs    07/08/18 2100 07/08/18 2105 07/09/18 0325  WBC 14.3*  --  13.0*  HGB 12.9* 12.2* 12.0*  HCT 38.7* 36.0* 36.5*  PLT 115*  --  121*   BMET:  Recent Labs    07/08/18 2100 07/08/18 2105 07/09/18 0325  NA  --  141 137  K  --  4.1 4.4  CL  --   --  107  CO2  --   --  22  GLUCOSE  --  114* 128*  BUN  --   --  10  CREATININE 0.82  --  0.95  CALCIUM  --   --  7.6*    CMET: Lab Results  Component Value Date   WBC 13.0 (H) 07/09/2018   HGB 12.0 (L) 07/09/2018   HCT 36.5 (L) 07/09/2018   PLT 121 (L) 07/09/2018   GLUCOSE 128 (H) 07/09/2018   CHOL 149 07/22/2017   TRIG 81.0 07/22/2017   HDL 55.20 07/22/2017   LDLCALC 77 07/22/2017   ALT 14 07/03/2018   AST 20 07/03/2018   NA 137 07/09/2018   K 4.4 07/09/2018   CL 107 07/09/2018   CREATININE 0.95 07/09/2018   BUN 10 07/09/2018   CO2 22 07/09/2018   TSH 1.02 07/22/2017   PSA 0.01 (L) 07/22/2017   INR 1.4 (H) 07/08/2018   HGBA1C 5.1 07/03/2018      PT/INR:  Recent Labs    07/08/18 1508  LABPROT 17.3*  INR  1.4*   Radiology: Dg Chest Port 1 View  Result Date: 07/09/2018 CLINICAL DATA:  Follow-up chest tube EXAM: PORTABLE CHEST 1 VIEW COMPARISON:  07/08/2018 FINDINGS: Postsurgical changes are again seen. Swan-Ganz catheter is noted within the right pulmonary outflow tract. Right thoracostomy tube and pericardial drain are noted in satisfactory position. No definitive pneumothorax is seen. Minimal basilar atelectasis is noted. IMPRESSION: Minimal bibasilar atelectasis. Tubes and lines as described. Electronically Signed   By: Inez Catalina M.D.   On: 07/09/2018 07:09   Dg Chest Port 1 View  Result Date: 07/08/2018 CLINICAL DATA:  Atelectasis. EXAM: PORTABLE CHEST 1 VIEW COMPARISON:  Chest x-ray dated Jul 03, 2018. FINDINGS: New left internal jugular Swan-Ganz catheter with tip in right pulmonary artery. New right chest tube and pericardial drain. Interval mitral valve replacement. Stable cardiomediastinal silhouette. Normal pulmonary vascularity. Linear atelectasis at the right lung base. Trace left pleural effusion. No pneumothorax. No acute osseous abnormality. IMPRESSION: 1. Interval mitral valve replacement. Mild right basilar atelectasis and trace left pleural effusion. Electronically Signed   By: Titus Dubin M.D.   On: 07/08/2018 15:46     Assessment/Plan: S/P Procedure(s) (LRB): MINIMALLY INVASIVE MITRAL VALVE REPAIR (MVR) (Right) TRANSESOPHAGEAL ECHOCARDIOGRAM (TEE) (N/A)  -Postop day 1 mini mitral valve repair for severe mitral insufficiency.  Patient was extubated operating room immediately postop.  He has remained stable from hemodynamic and respiratory standpoint.  Will discontinue the PA catheter this morning and mobilize.  Leave chest tubes today.  -Expected acute blood loss anemia and thrombocytopenia.  Minimal ongoing losses.  Continue to monitor labs.  -Endo-diabetic prior to surgery.  Continue sliding scale coverage as required.  -Gastroesophageal reflux disease-continue PPI   -DVT prophylaxis-continue SCDs.  Plan to add enoxaparin later.   Antony Odea, PA-C [phone] 07/09/2018 7:45 AM  I have seen and examined the patient and agree with the assessment and plan as outlined.  Doing well POD1.  Maintaining NSR w/ stable hemodynamics and breathing comfortably w/ O2 sats 100%, CXR clear.  Minimal pain.  Mobilize.  D/C lines.  Diuresis. Start Coumadin.  [name], MD 07/09/2018 8:11 AM

## 2018-07-10 ENCOUNTER — Inpatient Hospital Stay (HOSPITAL_COMMUNITY): Payer: Medicare Other

## 2018-07-10 LAB — BLOOD GAS, ARTERIAL
Acid-base deficit: 1.1 mmol/L (ref 0.0–2.0)
Bicarbonate: 23.5 mmol/L (ref 20.0–28.0)
Drawn by: 252037
O2 Content: 3 L/min
O2 Saturation: 99.2 %
Patient temperature: 98.6
pCO2 arterial: 41.8 mmHg (ref 32.0–48.0)
pH, Arterial: 7.368 (ref 7.350–7.450)
pO2, Arterial: 166 mmHg — ABNORMAL HIGH (ref 83.0–108.0)

## 2018-07-10 LAB — BASIC METABOLIC PANEL
Anion gap: 8 (ref 5–15)
BUN: 12 mg/dL (ref 8–23)
CO2: 28 mmol/L (ref 22–32)
Calcium: 8.2 mg/dL — ABNORMAL LOW (ref 8.9–10.3)
Chloride: 98 mmol/L (ref 98–111)
Creatinine, Ser: 0.81 mg/dL (ref 0.61–1.24)
GFR calc Af Amer: >60 mL/min (ref >60)
GFR calc non Af Amer: >60 mL/min (ref >60)
Glucose, Bld: 125 mg/dL — ABNORMAL HIGH (ref 70–99)
Potassium: 4.1 mmol/L (ref 3.5–5.1)
Sodium: 134 mmol/L — ABNORMAL LOW (ref 135–145)

## 2018-07-10 LAB — CBC
HCT: 35 % — ABNORMAL LOW (ref 39.0–52.0)
Hemoglobin: 11.6 g/dL — ABNORMAL LOW (ref 13.0–17.0)
MCH: 30.1 pg (ref 26.0–34.0)
MCHC: 33.1 g/dL (ref 30.0–36.0)
MCV: 90.7 fL (ref 80.0–100.0)
Platelets: 105 10*3/uL — ABNORMAL LOW (ref 150–400)
RBC: 3.86 MIL/uL — ABNORMAL LOW (ref 4.22–5.81)
RDW: 12.9 % (ref 11.5–15.5)
WBC: 12.8 10*3/uL — ABNORMAL HIGH (ref 4.0–10.5)
nRBC: 0 % (ref 0.0–0.2)

## 2018-07-10 LAB — GLUCOSE, CAPILLARY
Glucose-Capillary: 112 mg/dL — ABNORMAL HIGH (ref 70–99)
Glucose-Capillary: 98 mg/dL (ref 70–99)

## 2018-07-10 LAB — PROTIME-INR
INR: 1.2 (ref 0.8–1.2)
Prothrombin Time: 15.3 seconds — ABNORMAL HIGH (ref 11.4–15.2)

## 2018-07-10 MED ORDER — MOVING RIGHT ALONG BOOK
Freq: Once | Status: AC
  Administered 2018-07-10: 10:00:00
  Filled 2018-07-10: qty 1

## 2018-07-10 MED ORDER — KETOROLAC TROMETHAMINE 15 MG/ML IJ SOLN
15.0000 mg | Freq: Four times a day (QID) | INTRAMUSCULAR | Status: AC
  Administered 2018-07-10 – 2018-07-11 (×3): 15 mg via INTRAVENOUS
  Filled 2018-07-10 (×3): qty 1

## 2018-07-10 MED ORDER — FUROSEMIDE 40 MG PO TABS: 40.0000 mg | ORAL_TABLET | Freq: Every day | ORAL | Status: DC

## 2018-07-10 MED ORDER — ASPIRIN EC 81 MG PO TBEC
81.0000 mg | DELAYED_RELEASE_TABLET | Freq: Every day | ORAL | Status: DC
  Administered 2018-07-10 – 2018-07-13 (×4): 81 mg via ORAL
  Filled 2018-07-10 (×4): qty 1

## 2018-07-10 MED ORDER — METOPROLOL TARTRATE 12.5 MG HALF TABLET
12.5000 mg | ORAL_TABLET | Freq: Two times a day (BID) | ORAL | Status: DC
  Administered 2018-07-10 – 2018-07-11 (×3): 12.5 mg via ORAL
  Filled 2018-07-10 (×4): qty 1

## 2018-07-10 MED ORDER — POTASSIUM CHLORIDE CRYS ER 20 MEQ PO TBCR: 20.0000 meq | EXTENDED_RELEASE_TABLET | Freq: Every day | ORAL | Status: DC

## 2018-07-10 NOTE — Progress Notes (Signed)
Patient arrived to unit c/o nausea, stated he has been nauseous since eating lunch and just not feeling well. Administered prn Zofran. VS stable. Patient is sitting up in bedside chair. Will continue to monitor.

## 2018-07-10 NOTE — Progress Notes (Signed)
Report given at this time to 4E nurse.

## 2018-07-10 NOTE — Progress Notes (Addendum)
TCTS DAILY ICU PROGRESS NOTE                   [address]            [place] [address]          [phone]   2 Days Post-Op Procedure(s) (LRB): MINIMALLY INVASIVE MITRAL VALVE REPAIR (MVR) (Right) TRANSESOPHAGEAL ECHOCARDIOGRAM (TEE) (N/A)  Total Length of Stay:  LOS: 2 days   Subjective: Up in the bedside chair having breakfast but says his appetite is not very good.  Says he is overall feeling much better today with much less discomfort.  He is pleased with his progress.  Objective: Vital signs in last 24 hours: Temp:  [98 F (36.7 C)-99.1 F (37.3 C)] 98 F (36.7 C) (06/05 0300) Pulse Rate:  [60-78] 78 (06/05 0700) Cardiac Rhythm: Normal sinus rhythm;Heart block (06/04 2200) Resp:  [12-33] 16 (06/05 0700) BP: (116-157)/(54-80) 125/78 (06/05 0600) SpO2:  [89 %-100 %] 90 % (06/05 0700) Arterial Line BP: (142)/(52) 142/52 (06/04 0900) Weight:  [78.5 kg] 78.5 kg (06/05 0600)  Filed Weights   07/08/18 0642 07/09/18 0500 07/10/18 0600  Weight: 77.1 kg 78.5 kg 78.5 kg    Weight change: 0 kg   Hemodynamic parameters for last 24 hours: PAP: (25)/(8) 25/8  Intake/Output from previous day: 06/04 0701 - 06/05 0700 In: 1022.7 [P.O.:480; I.V.:242.8; IV Piggyback:299.9] Out: 3015 [Urine:2645; Chest Tube:370]  Intake/Output this shift: No intake/output data recorded.  Current Meds: Scheduled Meds: . acetaminophen  1,000 mg Oral Q6H  . aspirin EC  81 mg Oral Daily  . bisacodyl  10 mg Oral Daily   Or  . bisacodyl  10 mg Rectal Daily  . Chlorhexidine Gluconate Cloth  6 each Topical Daily  . docusate sodium  200 mg Oral Daily  . enoxaparin (LOVENOX) injection  30 mg Subcutaneous QHS  . furosemide  20 mg Intravenous BID  . [START ON 07/11/2018] furosemide  40 mg Oral Daily  . mouth rinse  15 mL Mouth Rinse BID  . metoprolol tartrate  12.5 mg Oral BID  . moving right along book   Does not apply Once  . pantoprazole  40 mg Oral Daily  . [START ON  07/11/2018] potassium chloride  20 mEq Oral Daily  . sodium chloride flush  10-40 mL Intracatheter Q12H  . warfarin  2.5 mg Oral q1800  . Warfarin - Physician Dosing Inpatient   Does not apply q1800   Continuous Infusions: . sodium chloride 1 mL/hr at 07/09/18 0554  . lactated ringers 10 mL/hr at 07/10/18 0700  . lactated ringers     PRN Meds:.metoprolol tartrate, morphine injection, ondansetron (ZOFRAN) IV, oxyCODONE, sodium chloride flush, traMADol  General appearance: alert, cooperative and no distress Neurologic: intact Heart: regular rate and rhythm Lungs: clear to auscultation bilaterally. Chest: Mediastinum and right pleural tube were secured.  Chest tube drainage 370 mls past 24 hours but has tapered off past shift.  CXR shows a small apical PTX. There is no air leak. Abdomen: Soft and nontender Exts. All well perfused with palpable DP pulses.  Wound: Right chest incision is covered with surgical dressing remains dry and intact.  The right groin vascular access site is dry, no sign of hematoma mild expected bruising.  Lab Results: CBC: Recent Labs    07/09/18 1610 07/10/18 0308  WBC 14.5* 12.8*  HGB 12.1* 11.6*  HCT 36.3* 35.0*  PLT 123* 105*   BMET:  Recent Labs  07/09/18 1610 07/10/18 0308  NA 136 134*  K 4.3 4.1  CL 103 98  CO2 28 28  GLUCOSE 138* 125*  BUN 12 12  CREATININE 0.85 0.81  CALCIUM 8.0* 8.2*    CMET: Lab Results  Component Value Date   WBC 12.8 (H) 07/10/2018   HGB 11.6 (L) 07/10/2018   HCT 35.0 (L) 07/10/2018   PLT 105 (L) 07/10/2018   GLUCOSE 125 (H) 07/10/2018   CHOL 149 07/22/2017   TRIG 81.0 07/22/2017   HDL 55.20 07/22/2017   LDLCALC 77 07/22/2017   ALT 14 07/03/2018   AST 20 07/03/2018   NA 134 (L) 07/10/2018   K 4.1 07/10/2018   CL 98 07/10/2018   CREATININE 0.81 07/10/2018   BUN 12 07/10/2018   CO2 28 07/10/2018   TSH 1.02 07/22/2017   PSA 0.01 (L) 07/22/2017   INR 1.2 07/10/2018   HGBA1C 5.1 07/03/2018       PT/INR:  Recent Labs    07/10/18 0308  LABPROT 15.3*  INR 1.2   Radiology: Dg Chest Port 1 View  Result Date: 07/10/2018 CLINICAL DATA:  Status post mitral valve replacement. EXAM: PORTABLE CHEST 1 VIEW COMPARISON:  Radiograph July 09, 2018. FINDINGS: Stable cardiomediastinal silhouette. Swan-Ganz catheter has been removed. Left internal jugular venous sheath remains. Stable position of 2 right-sided chest tubes are noted. Minimal right apical pneumothorax is noted. Mild bibasilar subsegmental atelectasis is noted. Bony thorax is unremarkable. IMPRESSION: Stable position of 2 right-sided chest tubes are noted. Minimal right apical pneumothorax is noted which is slightly enlarged compared to prior exam. Mild bibasilar subsegmental atelectasis. Electronically Signed   By: Marijo Conception M.D.   On: 07/10/2018 07:26     Assessment/Plan: S/P Procedure(s) (LRB): MINIMALLY INVASIVE MITRAL VALVE REPAIR (MVR) (Right) TRANSESOPHAGEAL ECHOCARDIOGRAM (TEE) (N/A)  -Postop day 2 mini mitral valve repair for severe mitral insufficiency.  Patient was extubated operating room immediately postop.    Continues to make steady progress..  Will discontinue Foley catheter this morning and advance activity.  Leave chest tubes this morning for drainage and a small  right apical PTX.  Coumadin started. INR 1.2  -Expected acute blood loss anemia and thrombocytopenia.  Minimal ongoing losses.  Continue to monitor labs.  -Endo- not diabetic prior to surgery.  Glucometers consistently less than 130,  okay to discontinue sliding scale coverage  -Gastroesophageal reflux disease-continue PPI  -DVT prophylaxis- add enoxaparin today  Antony Odea, PA-C [phone] 07/10/2018 8:02 AM  I have seen and examined the patient and agree with the assessment and plan as outlined.  Doing well POD2.  Mobilize.  Diuresis.  Leave chest tubes one more day.  Transfer step-down.  [name], MD 07/10/2018 8:41 AM

## 2018-07-11 LAB — BASIC METABOLIC PANEL
Anion gap: 8 (ref 5–15)
BUN: 11 mg/dL (ref 8–23)
CO2: 26 mmol/L (ref 22–32)
Calcium: 8.3 mg/dL — ABNORMAL LOW (ref 8.9–10.3)
Chloride: 103 mmol/L (ref 98–111)
Creatinine, Ser: 0.74 mg/dL (ref 0.61–1.24)
GFR calc Af Amer: >60 mL/min (ref >60)
GFR calc non Af Amer: >60 mL/min (ref >60)
Glucose, Bld: 102 mg/dL — ABNORMAL HIGH (ref 70–99)
Potassium: 3.6 mmol/L (ref 3.5–5.1)
Sodium: 137 mmol/L (ref 135–145)

## 2018-07-11 LAB — CBC
HCT: 34.7 % — ABNORMAL LOW (ref 39.0–52.0)
Hemoglobin: 12.1 g/dL — ABNORMAL LOW (ref 13.0–17.0)
MCH: 30.3 pg (ref 26.0–34.0)
MCHC: 34.9 g/dL (ref 30.0–36.0)
MCV: 87 fL (ref 80.0–100.0)
Platelets: 99 10*3/uL — ABNORMAL LOW (ref 150–400)
RBC: 3.99 MIL/uL — ABNORMAL LOW (ref 4.22–5.81)
RDW: 12.6 % (ref 11.5–15.5)
WBC: 10.3 10*3/uL (ref 4.0–10.5)
nRBC: 0 % (ref 0.0–0.2)

## 2018-07-11 LAB — PROTIME-INR
INR: 1.2 (ref 0.8–1.2)
Prothrombin Time: 15 seconds (ref 11.4–15.2)

## 2018-07-11 MED ORDER — POTASSIUM CHLORIDE CRYS ER 20 MEQ PO TBCR
30.0000 meq | EXTENDED_RELEASE_TABLET | Freq: Two times a day (BID) | ORAL | Status: AC
  Administered 2018-07-11 (×2): 30 meq via ORAL
  Filled 2018-07-11 (×2): qty 1

## 2018-07-11 MED ORDER — FUROSEMIDE 10 MG/ML IJ SOLN
40.0000 mg | Freq: Once | INTRAMUSCULAR | Status: AC
  Administered 2018-07-11: 09:00:00 40 mg via INTRAVENOUS
  Filled 2018-07-11: qty 4

## 2018-07-11 NOTE — Progress Notes (Signed)
Chest tubes x 2 removed without difficulty from R lateral chest.  Sutures secured and covered with dry dressing.  Patient denies and SOB/pain or difficulty breathing.  1175 in drainage container.  Vital signs Q15 mins x 1 hour.    2 x nurses present for removal

## 2018-07-11 NOTE — Progress Notes (Signed)
On-Q pump empty and removed with difficulty.  1 x stitch removed

## 2018-07-11 NOTE — Progress Notes (Signed)
Brendan [name] called out to state he had a sharp pain in the right posterior upper back, which lasted for 1-2 minutes and stopped.  States this is the same pain he had the night of his surgery.  He currently is pain free.  VSS, O2 Sat 100% on RA.

## 2018-07-11 NOTE — Progress Notes (Signed)
CARDIAC REHAB PHASE I   PRE:  Rate/Rhythm: 89 SR  BP:  Supine:   Sitting: 123/73  Standing:    SaO2: 997% RA  MODE:  Ambulation: 420 ft   POST:  Rate/Rhythm: 98 SR  BP:  Supine:   Sitting: 132/76  Standing:    SaO2: 95% RA Looks good, tolerated ambulation well with rolling walker, assistance x1 due to chest tube and external pacer. Demonstrated incentive spirometer with great technique and effort.  Placed back in chair with call bell and telephone within reach. Instructed to walk with nursing staff 2 more times today and 3 times on Sunday. [phone]  Liliane Channel RN, BSN 07/11/2018 10:06 AM

## 2018-07-11 NOTE — Progress Notes (Signed)
Paged on call MD for Cardiovascular (Dr. Roxan Hockey) due to patient has converted to Afib and Aflutter, HR in the 50s but have gone down in mid 30s.    Last BP 123/57, asymptomatic, and alert.  Dr. Roxan Hockey called back, order to have patient placed on pacemaker box at VVI 70.  I will continue to monitor patient.

## 2018-07-11 NOTE — Plan of Care (Signed)
Care plans reviewed and patient is progressing.  

## 2018-07-11 NOTE — Progress Notes (Addendum)
      [address]       [place] [address]             [phone]        3 Days Post-Op Procedure(s) (LRB): MINIMALLY INVASIVE MITRAL VALVE REPAIR (MVR) (Right) TRANSESOPHAGEAL ECHOCARDIOGRAM (TEE) (N/A)  Subjective: Patient passing flatus but no bowel movement yet.   Objective: Vital signs in last 24 hours: Temp:  [97.6 F (36.4 C)-98.6 F (37 C)] 97.8 F (36.6 C) (06/06 0443) Pulse Rate:  [48-73] 70 (06/06 0443) Cardiac Rhythm: Normal sinus rhythm (06/06 0651) Resp:  [8-23] 15 (06/06 0652) BP: (113-148)/(67-82) 123/73 (06/06 0652) SpO2:  [92 %-100 %] 97 % (06/06 0443) Weight:  [77 kg] 77 kg (06/06 0443)  Pre op weight 77.1 kg Current Weight  07/11/18 77 kg      Intake/Output from previous day: 06/05 0701 - 06/06 0700 In: 152.4 [P.O.:120; I.V.:32.4] Out: 2820 [Urine:2690; Chest Tube:130]   Physical Exam:  Cardiovascular: RRR, no murmur Pulmonary: Slightly diminished at right base;left lung is clear Abdomen: Soft, non tender, bowel sounds present. Extremities: No lower extremity edema. Wound: Aquacel removed and wound is clean and dry.  No erythema or signs of infection.  Lab Results: CBC: Recent Labs    07/10/18 0308 07/11/18 0314  WBC 12.8* 10.3  HGB 11.6* 12.1*  HCT 35.0* 34.7*  PLT 105* 99*   BMET:  Recent Labs    07/10/18 0308 07/11/18 0314  NA 134* 137  K 4.1 3.6  CL 98 103  CO2 28 26  GLUCOSE 125* 102*  BUN 12 11  CREATININE 0.81 0.74  CALCIUM 8.2* 8.3*    PT/INR:  Lab Results  Component Value Date   INR 1.2 07/11/2018   INR 1.2 07/10/2018   INR 1.4 (H) 07/08/2018   ABG:  INR: Will add last result for INR, ABG once components are confirmed Will add last 4 CBG results once components are confirmed  Assessment/Plan:  1. CV - First degree heart block, SR in the 70's this am. Back up VVI at 70 for bradycardia that occurred after midnight. On Lopressor 12.5 mg bid and Coumadin. INR remains 1.2. Will increase  Coumadin in am if INR not increased 2.  Pulmonary - On room air. Chest tubes with 130 last 24 hours. Hope to remove. Check CXR in am. Encourage incentive spirometer 3. Volume Overload - will give Lasix 40 mg IV this am 4.  Acute blood loss anemia - H and H increased to 12.1 and 34.7 this am 5. Thrombocytopenia-platelets this am slightly decreased to 99,000 6. Supplement potassium 7. Will remove EPW in am if no further bradycardia  Donielle M ZimmermanPA-C 07/11/2018,7:15 AM [phone]  Patient seen and examined, agree with above Dc chest tubes  Remo Lipps C. Roxan Hockey, MD [facility] [phone]

## 2018-07-12 ENCOUNTER — Inpatient Hospital Stay (HOSPITAL_COMMUNITY): Payer: Medicare Other

## 2018-07-12 LAB — PROTIME-INR
INR: 1.1 (ref 0.8–1.2)
Prothrombin Time: 14.4 seconds (ref 11.4–15.2)

## 2018-07-12 MED ORDER — POTASSIUM CHLORIDE CRYS ER 20 MEQ PO TBCR
20.0000 meq | EXTENDED_RELEASE_TABLET | Freq: Every day | ORAL | Status: DC
  Administered 2018-07-12 – 2018-07-13 (×2): 20 meq via ORAL
  Filled 2018-07-12 (×2): qty 1

## 2018-07-12 MED ORDER — FUROSEMIDE 40 MG PO TABS
40.0000 mg | ORAL_TABLET | Freq: Every day | ORAL | Status: DC
  Administered 2018-07-12 – 2018-07-13 (×2): 40 mg via ORAL
  Filled 2018-07-12 (×2): qty 1

## 2018-07-12 MED ORDER — WARFARIN SODIUM 5 MG PO TABS
5.0000 mg | ORAL_TABLET | Freq: Every day | ORAL | Status: DC
  Administered 2018-07-12: 5 mg via ORAL
  Filled 2018-07-12: qty 1

## 2018-07-12 NOTE — Discharge Instructions (Signed)
Discharge Instructions:  1. You may shower, please wash incisions daily with soap and water and keep dry.  If you wish to cover wounds with dressing you may do so but please keep clean and change daily.  No tub baths or swimming until incisions have completely healed.  If your incisions become red or develop any drainage please call our office at [phone]  2. No Driving until cleared by Dr. Owen's office and you are no longer using narcotic pain medications  3. Monitor your weight daily.. Please use the same scale and weigh at same time... If you gain 5-10 lbs in 48 hours with associated lower extremity swelling, please contact our office at [phone]  4. Fever of 101.5 for at least 24 hours with no source, please contact our office at [phone]  5. Activity- up as tolerated, please walk at least 3 times per day.  Avoid strenuous activity, no lifting, pushing, or pulling with your arms over 8-10 lbs for a minimum of 6 weeks  6. If any questions or concerns arise, please do not hesitate to contact our office at [phone] Information on my medicine - Coumadin   (Warfarin)   Why was Coumadin prescribed for you? Coumadin was prescribed for you because you have a blood clot or a medical condition that can cause an increased risk of forming blood clots. Blood clots can cause serious health problems by blocking the flow of blood to the heart, lung, or brain. Coumadin can prevent harmful blood clots from forming. As a reminder your indication for Coumadin is:   Blood Clot Prevention After Heart Valve Surgery  What test will check on my response to Coumadin? While on Coumadin (warfarin) you will need to have an INR test regularly to ensure that your dose is keeping you in the desired range. The INR (international normalized ratio) number is calculated from the result of the laboratory test called prothrombin time (PT).  If an INR APPOINTMENT HAS NOT ALREADY BEEN MADE FOR YOU please  schedule an appointment to have this lab work done by your health care provider within 7 days. Your INR goal is usually a number between:  2 to 3 or your provider may give you a more narrow range like 2-2.5.  Ask your health care provider during an office visit what your goal INR is.  What  do you need to  know  About  COUMADIN? Take Coumadin (warfarin) exactly as prescribed by your healthcare provider about the same time each day.  DO NOT stop taking without talking to the doctor who prescribed the medication.  Stopping without other blood clot prevention medication to take the place of Coumadin may increase your risk of developing a new clot or stroke.  Get refills before you run out.  What do you do if you miss a dose? If you miss a dose, take it as soon as you remember on the same day then continue your regularly scheduled regimen the next day.  Do not take two doses of Coumadin at the same time.  Important Safety Information A possible side effect of Coumadin (Warfarin) is an increased risk of bleeding. You should call your healthcare provider right away if you experience any of the following: ? Bleeding from an injury or your nose that does not stop. ? Unusual colored urine (red or dark brown) or unusual colored stools (red or black). ? Unusual bruising for unknown reasons. ? A serious fall or if you hit your head (even if   there is no bleeding).  Some foods or medicines interact with Coumadin (warfarin) and might alter your response to warfarin. To help avoid this: ? Eat a balanced diet, maintaining a consistent amount of Vitamin K. ? Notify your provider about major diet changes you plan to make. ? Avoid alcohol or limit your intake to 1 drink for women and 2 drinks for men per day. (1 drink is 5 oz. wine, 12 oz. beer, or 1.5 oz. liquor.)  Make sure that ANY health care provider who prescribes medication for you knows that you are taking Coumadin (warfarin).  Also make sure the  healthcare provider who is monitoring your Coumadin knows when you have started a new medication including herbals and non-prescription products.  Coumadin (Warfarin)  Major Drug Interactions  Increased Warfarin Effect Decreased Warfarin Effect  Alcohol (large quantities) Antibiotics (esp. Septra/Bactrim, Flagyl, Cipro) Amiodarone (Cordarone) Aspirin (ASA) Cimetidine (Tagamet) Megestrol (Megace) NSAIDs (ibuprofen, naproxen, etc.) Piroxicam (Feldene) Propafenone (Rythmol SR) Propranolol (Inderal) Isoniazid (INH) Posaconazole (Noxafil) Barbiturates (Phenobarbital) Carbamazepine (Tegretol) Chlordiazepoxide (Librium) Cholestyramine (Questran) Griseofulvin Oral Contraceptives Rifampin Sucralfate (Carafate) Vitamin K   Coumadin (Warfarin) Major Herbal Interactions  Increased Warfarin Effect Decreased Warfarin Effect  Garlic Ginseng Ginkgo biloba Coenzyme Q10 Green tea [place]'s wort    Coumadin (Warfarin) FOOD Interactions  Eat a consistent number of servings per week of foods HIGH in Vitamin K (1 serving =  cup)  Collards (cooked, or boiled & drained) Kale (cooked, or boiled & drained) Mustard greens (cooked, or boiled & drained) Parsley *serving size only =  cup Spinach (cooked, or boiled & drained) Swiss chard (cooked, or boiled & drained) Turnip greens (cooked, or boiled & drained)  Eat a consistent number of servings per week of foods MEDIUM-HIGH in Vitamin K (1 serving = 1 cup)  Asparagus (cooked, or boiled & drained) Broccoli (cooked, boiled & drained, or raw & chopped) Brussel sprouts (cooked, or boiled & drained) *serving size only =  cup Lettuce, raw (green leaf, endive, romaine) Spinach, raw Turnip greens, raw & chopped   These websites have more information on Coumadin (warfarin):  www.coumadin.com; www.ahrq.gov/consumer/coumadin.htm;    

## 2018-07-12 NOTE — Progress Notes (Addendum)
      [address]       [place] [address]             [phone]        4 Days Post-Op Procedure(s) (LRB): MINIMALLY INVASIVE MITRAL VALVE REPAIR (MVR) (Right) TRANSESOPHAGEAL ECHOCARDIOGRAM (TEE) (N/A)  Subjective: Patient had multiple bowel movements, almost loose. He did have a twinge of pain posterior right shoulder yesterday but resolved;he thinks may be muscular  Objective: Vital signs in last 24 hours: Temp:  [97.7 F (36.5 C)-98.7 F (37.1 C)] 98.2 F (36.8 C) (06/07 0500) Pulse Rate:  [70-90] 79 (06/07 0530) Cardiac Rhythm: Normal sinus rhythm (06/07 0115) Resp:  [12-21] 21 (06/07 0530) BP: (111-130)/(70-89) 124/76 (06/07 0500) SpO2:  [97 %-100 %] 100 % (06/07 0530) Weight:  [74.8 kg] 74.8 kg (06/07 0530)  Pre op weight 77.1 kg Current Weight  07/12/18 74.8 kg      Intake/Output from previous day: 06/06 0701 - 06/07 0700 In: 480 [P.O.:480] Out: 1380 [Urine:1380]   Physical Exam:  Cardiovascular: RRR, no murmur Pulmonary: Mostly clear Abdomen: Soft, non tender, bowel sounds present. Extremities: No lower extremity edema. Wound: Clean and dry.  No erythema or signs of infection. Sero sanguinous drainage from chest tube wounds.  Lab Results: CBC: Recent Labs    07/10/18 0308 07/11/18 0314  WBC 12.8* 10.3  HGB 11.6* 12.1*  HCT 35.0* 34.7*  PLT 105* 99*   BMET:  Recent Labs    07/10/18 0308 07/11/18 0314  NA 134* 137  K 4.1 3.6  CL 98 103  CO2 28 26  GLUCOSE 125* 102*  BUN 12 11  CREATININE 0.81 0.74  CALCIUM 8.2* 8.3*    PT/INR:  Lab Results  Component Value Date   INR 1.1 07/12/2018   INR 1.2 07/11/2018   INR 1.2 07/10/2018   ABG:  INR: Will add last result for INR, ABG once components are confirmed Will add last 4 CBG results once components are confirmed  Assessment/Plan:  1. CV - First degree heart block, SR in the 70's this am.  He has been on back up pacer and nurse reports he did pace for about  one hour yesterday around 4 pm. On Lopressor 12.5 mg bid and Coumadin. INR remains 1.1. Will increase Coumadin. 2.  Pulmonary - On room air. Chest tubes removed yesterday. CXR this am ordered but not taken yet. Encourage incentive spirometer 3. Volume Overload - will give Lasix 40 mg po this am 4.  Acute blood loss anemia - Last H and H increased to 12.1 and 34.7 this am 5. Thrombocytopenia-platelets this am slightly decreased to 99,000 6. He was put on VVI backup after midnight 06/06 for bradycardia. Will remove EPW later today if has no further bradycarida 7. Stop stool softeners 8. Possible discharge in am  [place] 07/12/2018,7:18 AM [phone]  Patient seen and examined, agree with above Will dc pacing wires, monitor rhythm  Remo Lipps C. Roxan Hockey, MD [facility] [phone]

## 2018-07-12 NOTE — Plan of Care (Signed)
Care plans reviewed and patient is progressing.  

## 2018-07-13 LAB — PROTIME-INR
INR: 1.1 (ref 0.8–1.2)
Prothrombin Time: 14.4 seconds (ref 11.4–15.2)

## 2018-07-13 MED ORDER — METOPROLOL TARTRATE 25 MG PO TABS: 12.5000 mg | ORAL_TABLET | Freq: Two times a day (BID) | ORAL | 2 refills | Status: DC

## 2018-07-13 MED ORDER — TRAMADOL HCL 50 MG PO TABS: 50.0000 mg | ORAL_TABLET | ORAL | 0 refills | Status: AC | PRN

## 2018-07-13 MED ORDER — WARFARIN SODIUM 5 MG PO TABS: 5.0000 mg | ORAL_TABLET | Freq: Every day | ORAL | 2 refills | Status: DC

## 2018-07-13 MED ORDER — ASPIRIN 81 MG PO TBEC: 81.0000 mg | DELAYED_RELEASE_TABLET | Freq: Every day | ORAL | Status: AC

## 2018-07-13 MED FILL — Dexmedetomidine HCl in NaCl 0.9% IV Soln 400 MCG/100ML: INTRAVENOUS | Qty: 100 | Status: AC

## 2018-07-13 MED FILL — Magnesium Sulfate Inj 50%: INTRAMUSCULAR | Qty: 10 | Status: AC

## 2018-07-13 MED FILL — Sodium Bicarbonate IV Soln 8.4%: INTRAVENOUS | Qty: 50 | Status: AC

## 2018-07-13 MED FILL — Sodium Chloride IV Soln 0.9%: INTRAVENOUS | Qty: 3000 | Status: AC

## 2018-07-13 MED FILL — Mannitol IV Soln 20%: INTRAVENOUS | Qty: 1000 | Status: AC

## 2018-07-13 MED FILL — Potassium Chloride Inj 2 mEq/ML: INTRAVENOUS | Qty: 40 | Status: AC

## 2018-07-13 MED FILL — Heparin Sodium (Porcine) Inj 1000 Unit/ML: INTRAMUSCULAR | Qty: 30 | Status: AC

## 2018-07-13 MED FILL — Electrolyte-R (PH 7.4) Solution: INTRAVENOUS | Qty: 4000 | Status: AC

## 2018-07-13 MED FILL — Lidocaine HCl Local Soln Prefilled Syringe 100 MG/5ML (2%): INTRAMUSCULAR | Qty: 25 | Status: AC

## 2018-07-13 NOTE — Progress Notes (Addendum)
5 Days Post-Op Procedure(s) (LRB): MINIMALLY INVASIVE MITRAL VALVE REPAIR (MVR) (Right) TRANSESOPHAGEAL ECHOCARDIOGRAM (TEE) (N/A) Subjective: Feels well, denies pain. He is independent with mobility and is anxious to go home.   Objective: Vital signs in last 24 hours: Temp:  [97.6 F (36.4 C)-98.2 F (36.8 C)] 97.7 F (36.5 C) (06/08 0456) Pulse Rate:  [91-95] 91 (06/08 0456) Cardiac Rhythm: Normal sinus rhythm;Heart block (06/07 2149) Resp:  [15-26] 19 (06/08 0456) BP: (117-138)/(68-81) 138/73 (06/08 0456) SpO2:  [53 %-100 %] 99 % (06/08 0456) Weight:  [73.8 kg] 73.8 kg (06/08 0456)  Hemodynamic parameters for last 24 hours:    Intake/Output from previous day: 06/07 0701 - 06/08 0700 In: 1080 [P.O.:1080] Out: 300 [Urine:300] Intake/Output this shift: No intake/output data recorded.  General appearance: alert, cooperative and no distress Neurologic: intact Heart: regular rate and rhythm Lungs: clear to auscultation bilaterally Abdomen: soft and non-tender Wound: Right chest incision intact and dry. Right groin vascular access site has minor bruising, no evidence of hematoma.   Lab Results: Recent Labs    07/11/18 0314  WBC 10.3  HGB 12.1*  HCT 34.7*  PLT 99*   BMET:  Recent Labs    07/11/18 0314  NA 137  K 3.6  CL 103  CO2 26  GLUCOSE 102*  BUN 11  CREATININE 0.74  CALCIUM 8.3*    PT/INR:  Recent Labs    07/13/18 0306  LABPROT 14.4  INR 1.1   ABG    Component Value Date/Time   PHART 7.368 07/09/2018 0339   HCO3 23.5 07/09/2018 0339   TCO2 22 07/08/2018 1635   ACIDBASEDEF 1.1 07/09/2018 0339   O2SAT 99.2 07/09/2018 0339   CBG (last 3)  No results for input(s): GLUCAP in the last 72 hours.  Assessment/Plan: S/P Procedure(s) (LRB): MINIMALLY INVASIVE MITRAL VALVE REPAIR (MVR) (Right) TRANSESOPHAGEAL ECHOCARDIOGRAM (TEE) (N/A)  -Postop day 5 mini mitral valve repair for severe mitral insufficiency.Had some bradycardia over the weekend so  metoprolol was suspended. Maintaining NSR with HR in low 90's.   Coumadin started. INR 1.1 after dosing Coumadin 2.5mg -2.5mg -5mg . Will remove pacer wires this AM. Anticipate discharge later today.  -Expected acute blood loss anemia and thrombocytopenia. Hct 35% on 07/11/18.   -Gastroesophageal reflux disease-continue PPI  -DVT prophylaxis- enoxaparin daily.    LOS: 5 days    Antony Odea, PA-C [phone] 07/13/2018   I have seen and examined the patient and agree with the assessment and plan as outlined.  D/C home today. Instructions given.  [name], MD 07/13/2018 8:08 AM

## 2018-07-13 NOTE — Progress Notes (Signed)
Patient was discharged today with his wife.  Patient's IV was removed and telemetry discontinued.  Patient left with all of their personal belongings.  AVS documentation  was reviewed with patient and all questions were answered.

## 2018-07-13 NOTE — Care Management Important Message (Signed)
Important Message  Patient Details  Name: Brendan [name] MRN: [id number] Date of Birth: [birth year]   Medicare Important Message Given:  Yes    Shelda Altes 07/13/2018, 12:08 PM

## 2018-07-13 NOTE — Progress Notes (Signed)
Epicardial pacing wires removed, per protocol. Wire ends intact. Sites clean and dry. Vitals stable and being monitored, per protocol. Bedrest x1 hour. Pt aware. Pt in bed with call light within reach. Will continue to monitor.

## 2018-07-13 NOTE — Progress Notes (Signed)
[phone] Pt observed walking in hall independently so did not walk again. Reviewed restrictions, IS, ex ed and gave heart healthy diet. Pt stated he had talked to the pharmacist on Coumadin and  stated would provide a booklet. Discussed CRP 2 and virtual App. Pt interested in virtual ex program. Will refer to [place] CRP 2. Put on discharge video for him to view. Understanding voiced of ed. Graylon Good RN BSN 07/13/2018 9:22 AM

## 2018-07-14 ENCOUNTER — Telehealth: Payer: Self-pay | Admitting: *Deleted

## 2018-07-14 NOTE — Telephone Encounter (Signed)
Pt was on TCM report admitted 07/08/18 for mitral valve prolapse with severe mitral regurgitation. Pt underwent a invasive mitral valve repair which procedure went well. Pt D/C 07/13/18, and will follow-up w/cardiology 07/28/18.Marland KitchenJohny Chess

## 2018-07-16 ENCOUNTER — Telehealth: Payer: Self-pay

## 2018-07-16 ENCOUNTER — Ambulatory Visit (INDEPENDENT_AMBULATORY_CARE_PROVIDER_SITE_OTHER): Payer: Medicare Other | Admitting: Pharmacist

## 2018-07-16 ENCOUNTER — Other Ambulatory Visit: Payer: Self-pay

## 2018-07-16 DIAGNOSIS — Z7901 Long term (current) use of anticoagulants: Secondary | ICD-10-CM | POA: Diagnosis not present

## 2018-07-16 DIAGNOSIS — Z9889 Other specified postprocedural states: Secondary | ICD-10-CM

## 2018-07-16 LAB — POCT INR: INR: 1.5 — AB (ref 2.0–3.0)

## 2018-07-16 NOTE — Telephone Encounter (Signed)

## 2018-07-22 LAB — ECHO INTRAOPERATIVE TEE
Height: 69 in
Weight: 2720 oz

## 2018-07-22 NOTE — Anesthesia Postprocedure Evaluation (Signed)
Anesthesia Post Note  Patient: Brendan [name]  Procedure(s) Performed: MINIMALLY INVASIVE MITRAL VALVE REPAIR (MVR) (Right Chest) TRANSESOPHAGEAL ECHOCARDIOGRAM (TEE) (N/A )     Patient location during evaluation: SICU Anesthesia Type: General Level of consciousness: sedated Pain management: pain level controlled Vital Signs Assessment: post-procedure vital signs reviewed and stable Respiratory status: spontaneous breathing, respiratory function stable and patient connected to face mask oxygen Cardiovascular status: stable Postop Assessment: no apparent nausea or vomiting Anesthetic complications: no    Last Vitals:  Vitals:   07/13/18 1130 07/13/18 1400  BP: 126/74   Pulse:    Resp: 14 17  Temp:    SpO2:      Last Pain:  Vitals:   07/13/18 1400  TempSrc:   PainSc: 0-No pain                 Bridgette Wolden S

## 2018-07-23 ENCOUNTER — Ambulatory Visit (INDEPENDENT_AMBULATORY_CARE_PROVIDER_SITE_OTHER): Payer: Medicare Other | Admitting: Pharmacist Clinician (PhC)/ Clinical Pharmacy Specialist

## 2018-07-23 ENCOUNTER — Other Ambulatory Visit: Payer: Self-pay

## 2018-07-23 DIAGNOSIS — Z9889 Other specified postprocedural states: Secondary | ICD-10-CM | POA: Diagnosis not present

## 2018-07-23 DIAGNOSIS — Z7901 Long term (current) use of anticoagulants: Secondary | ICD-10-CM

## 2018-07-23 LAB — POCT INR: INR: 2.5 (ref 2.0–3.0)

## 2018-07-24 ENCOUNTER — Ambulatory Visit: Payer: Medicare Other | Admitting: Internal Medicine

## 2018-07-28 ENCOUNTER — Encounter: Payer: Self-pay | Admitting: Adult Health

## 2018-07-28 ENCOUNTER — Ambulatory Visit: Payer: Medicare Other | Admitting: Internal Medicine

## 2018-07-28 ENCOUNTER — Other Ambulatory Visit: Payer: Self-pay | Admitting: Thoracic Surgery (Cardiothoracic Vascular Surgery)

## 2018-07-28 ENCOUNTER — Telehealth (INDEPENDENT_AMBULATORY_CARE_PROVIDER_SITE_OTHER): Payer: Medicare Other | Admitting: Adult Health

## 2018-07-28 ENCOUNTER — Other Ambulatory Visit: Payer: Self-pay

## 2018-07-28 VITALS — BP 113/64 | HR 84 | Temp 98.5°F | Ht 69.0 in | Wt 160.0 lb

## 2018-07-28 DIAGNOSIS — Z9889 Other specified postprocedural states: Secondary | ICD-10-CM

## 2018-07-28 DIAGNOSIS — I341 Nonrheumatic mitral (valve) prolapse: Secondary | ICD-10-CM

## 2018-07-28 NOTE — Patient Instructions (Addendum)
Follow-Up: You will need a follow up appointment in 3 months 10-30-2018 @1020am  with Minus Breeding, MD or one of the following Advanced Practice Providers on your designated Care Team:  Rosaria Ferries, PA-C  Jory Sims, DNP, ANP       Medication Instructions:  NO CHANGES- Your physician recommends that you continue on your current medications as directed. Please refer to the Current Medication list given to you today. If you need a refill on your cardiac medications before your next appointment, please call your pharmacy. Labwork: When you have labs (blood work) and your tests are completely normal, you will receive your results ONLY by [place] (if you have MyChart) -OR- A paper copy in the mail.  At [facility], you and your health needs are our priority.  As part of our continuing mission to provide you with exceptional heart care, we have created designated Provider Care Teams.  These Care Teams include your primary Cardiologist (physician) and Advanced Practice Providers (APPs -  Physician Assistants and Nurse Practitioners) who all work together to provide you with the care you need, when you need it.  Thank you for choosing [facility]!!

## 2018-07-28 NOTE — Progress Notes (Signed)
Virtual Visit via Video Note   This visit type was conducted due to national recommendations for restrictions regarding the COVID-19 Pandemic (e.g. social distancing) in an effort to limit this patient's exposure and mitigate transmission in our community.  Due to his co-morbid illnesses, this patient is at least at moderate risk for complications without adequate follow up.  This format is felt to be most appropriate for this patient at this time.  All issues noted in this document were discussed and addressed.  A limited physical exam was performed with this format.  Please refer to the patient's chart for his consent to telehealth for [facility].   Date:  07/28/2018   ID:  Brendan [name], DOB [birth year], MRN [id number]  Patient Location: Home Provider Location: Office  PCP:  Cassandria Anger, MD  Cardiologist:  Hochrein Electrophysiologist:  None   Evaluation Performed:  New Patient Evaluation  Chief Complaint: TOC follow-up-status post mitral valve repair  History of Present Illness:    Brendan [name] is a 68 y.o. male we are following for ongoing assessment and management of mitral regurgitation, with history of severe mitral valve prolapse.  Other history includes GERD, hyperlipidemia, IBS, and decreased hearing, with prostate cancer.    Echocardiogram revealed LV function, regurgitant volume less than 60 mm by PI SA, regurgitant fraction less than 55% suggesting moderate severe MR.  He was found to have severe prolapse which may be amendable to repair.  He was to follow up with a cardiac catheterization, and discuss timing of valvular repair.  Cardiac catheterization dated 06/17/2018, revealed widely patent coronary arteries with minimal irregularity of the mid LAD otherwise angiographically normal vessels.  The patient had normal cardiac hemodynamics with the exception of a prominent V wave in the pulmonary wedge position consistent with hemodynamically significant  mitral insufficiency.  He was recommended for mitral valve repair.  On 07/08/2018 the patient had a minimally invasive mitral valve repair, using Sorin memo 4D ring angioplasty (size 30 mm ) , by Dr. Ricard Dillon.  He was discharged on 07/13/2018, he did have some postoperative bradycardia and Lopressor was discontinued.  He was initiated on Coumadin therapy during hospitalization with discharge dose of 5 mg daily.  Heart rate did improve prior to discharge and he was started on metoprolol 12.5 mg p.o. twice daily.  He is doing well today. Still has some soreness from incision site. He denies any severe pain, DOE, bleeding on the coumadin, or weakness. He is having some fatigue on the metoprolol which he was told to expect. He is due to see Dr. Ricard Dillon' tomorrow for follow up.   The patient does not have symptoms concerning for COVID-19 infection (fever, chills, cough, or new shortness of breath).    Past Medical History:  Diagnosis Date   Cancer [facility])    prostate.  skin basal cell   Decreased hearing    ED (erectile dysfunction)    GERD (gastroesophageal reflux disease)    Heart murmur    Hyperlipidemia    IBS (irritable bowel syndrome)    Incidental pulmonary nodule, > 2mm and < 31mm 07/02/2018   Right lung - needs f/u CT 6 months   MVP (mitral valve prolapse)    Nonrheumatic mitral valve regurgitation    Ruptured cervical disc 2008   S/P minimally invasive mitral valve repair 07/08/2018   Complex valvuloplasty including triangular resection of posterior leaflet, artificial Gore-tex neochord placement x6 and 30 mm Sorin Memo 4D ring annuloplasty via right  mini thoracotomy approach   Past Surgical History:  Procedure Laterality Date   ACHILLES TENDON SURGERY  1980   prosthesis   AMPUTATION FINGER / THUMB Right 12/2004   tip of thumb for strep infection   EYE SURGERY     MITRAL VALVE REPAIR Right 07/08/2018   Procedure: MINIMALLY INVASIVE MITRAL VALVE REPAIR (MVR);  Surgeon: [name], MD;  Location: [facility];  Service: Open Heart Surgery;  Laterality: Right;  GLUTARALDEHYDE   PROSTATECTOMY  2007   RIGHT/LEFT HEART CATH AND CORONARY ANGIOGRAPHY N/A 06/17/2018   Procedure: RIGHT/LEFT HEART CATH AND CORONARY ANGIOGRAPHY;  Surgeon: Sherren Mocha, MD;  Location: [place] CV LAB;  Service: Cardiovascular;  Laterality: N/A;   TEE WITHOUT CARDIOVERSION N/A 04/06/2018   Procedure: TRANSESOPHAGEAL ECHOCARDIOGRAM (TEE);  Surgeon: Sanda Klein, MD;  Location: [place];  Service: Cardiovascular;  Laterality: N/A;   TEE WITHOUT CARDIOVERSION N/A 07/08/2018   Procedure: TRANSESOPHAGEAL ECHOCARDIOGRAM (TEE);  Surgeon: [name], MD;  Location: [place];  Service: Open Heart Surgery;  Laterality: N/A;   TONSILLECTOMY       Current Meds  Medication Sig   acetaminophen (TYLENOL) 500 MG tablet Take 500 mg by mouth every 6 (six) hours as needed (pain.).   aspirin EC 81 MG EC tablet Take 1 tablet (81 mg total) by mouth daily.   chlorpheniramine (CHLOR-TRIMETON) 4 MG tablet Take 4 mg by mouth daily.   glycopyrrolate (ROBINUL) 2 MG tablet Take 1 tablet (2 mg total) by mouth daily.   lansoprazole (PREVACID) 30 MG capsule Take 30 mg by mouth daily before breakfast.    lovastatin (MEVACOR) 20 MG tablet Take 1 tablet (20 mg total) by mouth daily. (Patient taking differently: Take 20 mg by mouth at bedtime. )   metoprolol tartrate (LOPRESSOR) 25 MG tablet Take 0.5 tablets (12.5 mg total) by mouth 2 (two) times daily.   warfarin (COUMADIN) 5 MG tablet Take 1 tablet (5 mg total) by mouth daily at 6 PM. Or as instructed by the Coumadin Clinic. (Patient taking differently: Take 5 mg by mouth daily at 6 PM. Tuesday and Thursday 1 and 1/2 tablet 7.5mg . 1 tablet 5mg  other days.)     Allergies:   Hydrocodone   Social History   Tobacco Use   Smoking status: Never Smoker   Smokeless tobacco: Never Used  Substance Use Topics   Alcohol use: No    Comment: occasionally     Drug use: No     Family Hx: The patient's family history includes Heart disease in his mother; Heart disease (age of onset: 52) in his father; Hyperlipidemia in an other family member.  ROS:   Please see the history of present illness.    All other systems reviewed and are negative.   Prior CV studies:   The following studies were reviewed today: Echocardiogram 04/13/2018 1. The left ventricle has normal systolic function, with an ejection fraction of 60-65%. The cavity size was normal. Left ventricular diastolic parameters were normal. No evidence of left ventricular regional wall motion abnormalities. 2. 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 There is particularly severe prolapse of the lateral scallop of the posterior leaflet (P1), moderate prolapse of the middle scallop of the posterior leaflet (P2). 3. The right ventricle has normal systolic function. The wall thickness and cavity size was normal. 4. Right ventricular systolic pressure is normal with an estimated pressure of 26 mmHg. 5. Moderate to severe mitral valve regurgitation. The jet is eccentric, anteriorly  and medially directed. 6. Trivial aortic valve regurgitation. 7. The aortic root, ascending aorta, aortic arch and descending aorta are normal in size and structure.  Labs/Other Tests and Data Reviewed:    EKG:  No ECG reviewed.  Recent Labs: 07/03/2018: ALT 14 07/09/2018: Magnesium 2.3 07/11/2018: BUN 11; Creatinine, Ser 0.74; Hemoglobin 12.1; Platelets 99; Potassium 3.6; Sodium 137   Recent Lipid Panel Lab Results  Component Value Date/Time   CHOL 149 07/22/2017 10:25 AM   TRIG 81.0 07/22/2017 10:25 AM   HDL 55.20 07/22/2017 10:25 AM   CHOLHDL 3 07/22/2017 10:25 AM   LDLCALC 77 07/22/2017 10:25 AM    Wt Readings from Last 3 Encounters:  07/28/18 160 lb (72.6 kg)  07/13/18 162 lb 11.2 oz (73.8 kg)  07/06/18 170 lb (77.1 kg)     Objective:    Vital Signs:  BP 113/64    Pulse 84     Temp 98.5 F (36.9 C)    Ht 5\' 9"  (1.753 m)    Wt 160 lb (72.6 kg)    BMI 23.63 kg/m    VITAL SIGNS:  reviewed GEN:  no acute distress EYES:  sclerae anicteric, EOMI - Extraocular Movements Intact RESPIRATORY:  normal respiratory effort, symmetric expansion MUSCULOSKELETAL:  Well healed right upper chest incision from MVR. Chest tube incisions stll with sutures. No evidence of infection or bleediing  Keloid scar appears to be forming.  NEURO:  alert and oriented x 3, no obvious focal deficit PSYCH:  normal affect  ASSESSMENT & PLAN:    1. Severe Mitral Valve Disease: He is s/p minimally invasive MVR with Sorin Memo 4D angioplasty (30 mm) on 07/08/2018. He appears to be doing well and is without significant complaint today. He is medically compliant with some fatigue on metoprolol. Consider reducing dose or changing to XL for HS dosing to assist with this on follow up if this is persistent. He is to continue warfarin with dosing by the [facility] in the coumadin clinic. Will need SBE prophylaxis.   2. Hyperlipidemia: Continue lovastatin.   3. GERD: Continue PPI.   COVID-19 Education: The signs and symptoms of COVID-19 were discussed with the patient and how to seek care for testing (follow up with PCP or arrange E-visit).  HochreinThe importance of social distancing was discussed today.  Time:   Today, I have spent  15 minutes with the patient with telehealth technology discussing the above problems.     Medication Adjustments/Labs and Tests Ordered: Current medicines are reviewed at length with the patient today.  Concerns regarding medicines are outlined above.   Tests Ordered: No orders of the defined types were placed in this encounter.   Medication Changes: No orders of the defined types were placed in this encounter.   Disposition:  Follow up 3 months   Signed, Phill Myron. West Pugh, ANP, AACC  07/28/2018 2:29 PM    [facility]

## 2018-07-29 ENCOUNTER — Ambulatory Visit (INDEPENDENT_AMBULATORY_CARE_PROVIDER_SITE_OTHER): Payer: Self-pay | Admitting: Physician Assistant

## 2018-07-29 ENCOUNTER — Ambulatory Visit
Admission: RE | Admit: 2018-07-29 | Discharge: 2018-07-29 | Disposition: A | Discharge status: Home / Self Care | Payer: Medicare Other | Source: Ambulatory Visit | Attending: Thoracic Surgery (Cardiothoracic Vascular Surgery) | Admitting: Thoracic Surgery (Cardiothoracic Vascular Surgery)

## 2018-07-29 ENCOUNTER — Telehealth (HOSPITAL_COMMUNITY): Payer: Self-pay | Admitting: *Deleted

## 2018-07-29 VITALS — BP 130/81 | HR 94 | Temp 97.8°F | Resp 16 | Ht 69.0 in | Wt 170.0 lb

## 2018-07-29 DIAGNOSIS — I341 Nonrheumatic mitral (valve) prolapse: Secondary | ICD-10-CM

## 2018-07-29 DIAGNOSIS — Z9889 Other specified postprocedural states: Secondary | ICD-10-CM

## 2018-07-29 DIAGNOSIS — J948 Other specified pleural conditions: Secondary | ICD-10-CM | POA: Diagnosis not present

## 2018-07-29 MED ORDER — METOPROLOL TARTRATE 25 MG PO TABS: 12.5000 mg | ORAL_TABLET | Freq: Every day | ORAL | 2 refills | Status: DC

## 2018-07-29 NOTE — Telephone Encounter (Signed)
         Confirm Consent - In the setting of the current Covid19 crisis, you are scheduled for a phone visit with your Cardiac or Pulmonary team member.  Just as we do with many in-gym visits, in order for you to participate in this visit, we must obtain consent.  If you'd like, I can send this to your mychart (if signed up) or email for you to review.  Otherwise, I can obtain your verbal consent now.  By agreeing to a telephone visit, we'd like you to understand that the technology does not allow for your Cardiac or Pulmonary Rehab team member to perform a physical assessment, and thus may limit their ability to fully assess your ability to perform exercise programs. If your provider identifies any concerns that need to be evaluated in person, we will make arrangements to do so.  Finally, though the technology is pretty good, we cannot assure that it will always work on either your or our end and we cannot ensure that we have a secure connection.  Cardiac and Pulmonary Rehab Telehealth visits and "At Home" cardiac and pulmonary rehab are provided at no cost to you.        Are you willing to proceed?"        STAFF: Did the patient verbally acknowledge consent to telehealth visit? Document YES/NO here: Yes     Barnet Pall RN  Cardiac and Pulmonary Rehab Staff        Date June 24    @ 9:55 AM

## 2018-07-29 NOTE — Patient Instructions (Signed)
You may return to driving an automobile as long as you are no longer requiring oral narcotic pain relievers during the daytime.  It would be wise to start driving only short distances during the daylight and gradually increase from there as you feel comfortable.  Make every effort to maintain a "heart-healthy" lifestyle with regular physical exercise and adherence to a low-fat, low-carbohydrate diet.  Continue to seek regular follow-up appointments with your primary care physician and/or cardiologist.   Endocarditis is a potentially serious infection of heart valves or inside lining of the heart.  It occurs more commonly in patients with diseased heart valves (such as patient's with aortic or mitral valve disease) and in patients who have undergone heart valve repair or replacement.  Certain surgical and dental procedures may put you at risk, such as dental cleaning, other dental procedures, or any surgery involving the respiratory, urinary, gastrointestinal tract, gallbladder or prostate gland.   To minimize your chances for develooping endocarditis, maintain good oral health and seek prompt medical attention for any infections involving the mouth, teeth, gums, skin or urinary tract.    Always notify your doctor or dentist about your underlying heart valve condition before having any invasive procedures. You will need to take antibiotics before certain procedures, including all routine dental cleanings or other dental procedures.  Your cardiologist or dentist should prescribe these antibiotics for you to be taken ahead of time.   You may continue to gradually increase your physical activity as tolerated.  Refrain from any heavy lifting or strenuous use of your arms and shoulders until at least 8 weeks from the time of your surgery, and avoid activities that cause increased pain in your chest on the side of your surgical incision.  Otherwise you may continue to increase activities without any  particular limitations.  Increase the intensity and duration of physical activity gradually.

## 2018-07-29 NOTE — Progress Notes (Signed)
  HPI: Patient returns for routine postoperative follow-up having undergone MI MV Repair on 07/08/2018. The patient's early postoperative recovery while in the hospital was unremarkable.  Since hospital discharge the patient reports he has done fairly well.  He states he feels like he should be further along than he is.  However, he states he has stayed at home since his surgery which has also been discouraging.  He states that his energy level has not fully recovered.  He has also had some dizziness in the mornings.  His incisions have healed without evidence of infection.  He had multiple questions that have been addressed.   Current Outpatient Medications  Medication Sig Dispense Refill  . acetaminophen (TYLENOL) 500 MG tablet Take 500 mg by mouth every 6 (six) hours as needed (pain.).    Marland Kitchen aspirin EC 81 MG EC tablet Take 1 tablet (81 mg total) by mouth daily.    . chlorpheniramine (CHLOR-TRIMETON) 4 MG tablet Take 4 mg by mouth daily.    Marland Kitchen glycopyrrolate (ROBINUL) 2 MG tablet Take 1 tablet (2 mg total) by mouth daily. 90 tablet 3  . lansoprazole (PREVACID) 30 MG capsule Take 30 mg by mouth daily before breakfast.     . lovastatin (MEVACOR) 20 MG tablet Take 1 tablet (20 mg total) by mouth daily. (Patient taking differently: Take 20 mg by mouth at bedtime. ) 90 tablet 3  . metoprolol tartrate (LOPRESSOR) 25 MG tablet Take 0.5 tablets (12.5 mg total) by mouth daily. 30 tablet 2  . warfarin (COUMADIN) 5 MG tablet Take 1 tablet (5 mg total) by mouth daily at 6 PM. Or as instructed by the Coumadin Clinic. (Patient taking differently: Take 5 mg by mouth daily at 6 PM. Tuesday and Thursday 1 and 1/2 tablet 7.5mg . 1 tablet 5mg  other days.) 30 tablet 2   No current facility-administered medications for this visit.     Physical Exam:  BP 130/81 (BP Location: Right Arm, Patient Position: Sitting, Cuff Size: Normal)   Pulse 94   Temp 97.8 F (36.6 C) Comment: thermal  Resp 16   Ht 5\' 9"  (1.753 m)    Wt 170 lb (77.1 kg)   SpO2 98% Comment: RA  BMI 25.10 kg/m   Gen: no apparent distress Heart: RRR Lungs: CTA bilaterally Ext: no edema Incisions: clean and dry  Diagnostic Tests:  CXR: right sided apical pneumothorax, stable  A/P;  1. S/p MI MV Repair- doing well except for some continued fatigue and dizziness... will decrease Lopressor to 12.5 mg daily  2. Right Apical Pneumothorax- stable from d/c film 3. Dispo- RTC in September with repeat CXR with [facility], patient was given instructions on furthering recovery, he was instructed should he develop worsening onset chest pain shortness of breath he should be evaluated for increase in his pneumothorax  Ellwood Handler, PA-C [facility] [phone]

## 2018-07-30 ENCOUNTER — Other Ambulatory Visit: Payer: Self-pay

## 2018-07-30 ENCOUNTER — Encounter (HOSPITAL_COMMUNITY)
Admission: RE | Admit: 2018-07-30 | Discharge: 2018-07-30 | Disposition: A | Discharge status: Home / Self Care | Payer: Self-pay | Source: Ambulatory Visit | Attending: Cardiology | Admitting: Cardiology

## 2018-07-30 ENCOUNTER — Ambulatory Visit (INDEPENDENT_AMBULATORY_CARE_PROVIDER_SITE_OTHER): Payer: Medicare Other | Admitting: Pharmacist

## 2018-07-30 DIAGNOSIS — Z9889 Other specified postprocedural states: Secondary | ICD-10-CM | POA: Diagnosis not present

## 2018-07-30 DIAGNOSIS — Z7901 Long term (current) use of anticoagulants: Secondary | ICD-10-CM | POA: Diagnosis not present

## 2018-07-30 LAB — POCT INR: INR: 2.6 (ref 2.0–3.0)

## 2018-07-30 NOTE — Progress Notes (Signed)
Called and spoke to pt regarding Virtual Cardiac Rehab. Pt  was able to download the [employer] app on their smart device with no issues. Pt set up their account and received the following welcome message -"Welcome to the [facility] and Pulmonary Rehabilitation program. We hope that you will find the exercise program beneficial in your recovery process. Our staff is available to assist with any questions/concerns about your exercise routine. Best wishes". Brief orientation provided to with the advisement to watch the "Intro to Rehab" series located under the Resource tab. Pt verbalized understanding. Will continue to follow and monitor pt progress with feedback as needed. Pt is not interested in talking with dietician at this time.   Carma Lair MS, ACSM CEP 10:27 AM 07/30/2018

## 2018-08-10 ENCOUNTER — Telehealth: Payer: Self-pay

## 2018-08-10 NOTE — Telephone Encounter (Signed)

## 2018-08-17 ENCOUNTER — Ambulatory Visit (INDEPENDENT_AMBULATORY_CARE_PROVIDER_SITE_OTHER): Payer: Medicare Other | Admitting: *Deleted

## 2018-08-17 ENCOUNTER — Other Ambulatory Visit: Payer: Self-pay

## 2018-08-17 DIAGNOSIS — Z9889 Other specified postprocedural states: Secondary | ICD-10-CM

## 2018-08-17 DIAGNOSIS — Z7901 Long term (current) use of anticoagulants: Secondary | ICD-10-CM

## 2018-08-17 LAB — POCT INR: INR: 2.6 (ref 2.0–3.0)

## 2018-08-17 NOTE — Patient Instructions (Signed)
Description   Continue with 5mg  daily excpet for 7.5mg  every Tuesday and Thursday. Next INR in 3 week. Call us with any medication changes or concerns # [phone].

## 2018-08-20 ENCOUNTER — Telehealth (HOSPITAL_COMMUNITY): Payer: Self-pay

## 2018-08-31 ENCOUNTER — Other Ambulatory Visit: Payer: Self-pay | Admitting: *Deleted

## 2018-08-31 MED ORDER — LANSOPRAZOLE 30 MG PO CPDR: 30.0000 mg | DELAYED_RELEASE_CAPSULE | Freq: Every day | ORAL | 3 refills | Status: DC

## 2018-08-31 MED ORDER — LOVASTATIN 20 MG PO TABS: 20.0000 mg | ORAL_TABLET | Freq: Every day | ORAL | 3 refills | Status: DC

## 2018-08-31 MED ORDER — GLYCOPYRROLATE 2 MG PO TABS: 2.0000 mg | ORAL_TABLET | Freq: Every day | ORAL | 3 refills | Status: DC

## 2018-09-04 ENCOUNTER — Telehealth: Payer: Self-pay

## 2018-09-04 NOTE — Telephone Encounter (Signed)

## 2018-09-07 ENCOUNTER — Other Ambulatory Visit: Payer: Self-pay

## 2018-09-07 ENCOUNTER — Ambulatory Visit (INDEPENDENT_AMBULATORY_CARE_PROVIDER_SITE_OTHER): Payer: Medicare Other | Admitting: Pharmacist

## 2018-09-07 DIAGNOSIS — Z7901 Long term (current) use of anticoagulants: Secondary | ICD-10-CM

## 2018-09-07 DIAGNOSIS — Z9889 Other specified postprocedural states: Secondary | ICD-10-CM | POA: Diagnosis not present

## 2018-09-07 LAB — POCT INR: INR: 2.6 (ref 2.0–3.0)

## 2018-09-07 MED ORDER — WARFARIN SODIUM 5 MG PO TABS: ORAL_TABLET | ORAL | 2 refills | Status: DC

## 2018-09-17 DIAGNOSIS — Z85828 Personal history of other malignant neoplasm of skin: Secondary | ICD-10-CM | POA: Diagnosis not present

## 2018-09-17 DIAGNOSIS — L905 Scar conditions and fibrosis of skin: Secondary | ICD-10-CM | POA: Diagnosis not present

## 2018-09-17 DIAGNOSIS — L570 Actinic keratosis: Secondary | ICD-10-CM | POA: Diagnosis not present

## 2018-09-17 DIAGNOSIS — L812 Freckles: Secondary | ICD-10-CM | POA: Diagnosis not present

## 2018-09-17 DIAGNOSIS — D1801 Hemangioma of skin and subcutaneous tissue: Secondary | ICD-10-CM | POA: Diagnosis not present

## 2018-09-17 DIAGNOSIS — L821 Other seborrheic keratosis: Secondary | ICD-10-CM | POA: Diagnosis not present

## 2018-09-24 ENCOUNTER — Encounter: Payer: Self-pay | Admitting: Internal Medicine

## 2018-09-24 ENCOUNTER — Other Ambulatory Visit: Payer: Self-pay

## 2018-09-24 ENCOUNTER — Ambulatory Visit (INDEPENDENT_AMBULATORY_CARE_PROVIDER_SITE_OTHER): Payer: Medicare Other | Admitting: Internal Medicine

## 2018-09-24 ENCOUNTER — Other Ambulatory Visit (INDEPENDENT_AMBULATORY_CARE_PROVIDER_SITE_OTHER): Payer: Medicare Other

## 2018-09-24 VITALS — BP 120/88 | HR 90 | Temp 97.6°F | Ht 69.0 in | Wt 167.0 lb

## 2018-09-24 DIAGNOSIS — D508 Other iron deficiency anemias: Secondary | ICD-10-CM

## 2018-09-24 DIAGNOSIS — D649 Anemia, unspecified: Secondary | ICD-10-CM | POA: Insufficient documentation

## 2018-09-24 DIAGNOSIS — I340 Nonrheumatic mitral (valve) insufficiency: Secondary | ICD-10-CM | POA: Diagnosis not present

## 2018-09-24 DIAGNOSIS — Z7901 Long term (current) use of anticoagulants: Secondary | ICD-10-CM

## 2018-09-24 DIAGNOSIS — E785 Hyperlipidemia, unspecified: Secondary | ICD-10-CM

## 2018-09-24 LAB — CBC WITH DIFFERENTIAL/PLATELET
Basophils Absolute: 0.1 10*3/uL (ref 0.0–0.1)
Basophils Relative: 0.9 % (ref 0.0–3.0)
Eosinophils Absolute: 0.3 10*3/uL (ref 0.0–0.7)
Eosinophils Relative: 5.2 % — ABNORMAL HIGH (ref 0.0–5.0)
HCT: 42.7 % (ref 39.0–52.0)
Hemoglobin: 14.1 g/dL (ref 13.0–17.0)
Lymphocytes Relative: 26.2 % (ref 12.0–46.0)
Lymphs Abs: 1.6 10*3/uL (ref 0.7–4.0)
MCHC: 33.1 g/dL (ref 30.0–36.0)
MCV: 83.6 fl (ref 78.0–100.0)
Monocytes Absolute: 0.4 10*3/uL (ref 0.1–1.0)
Monocytes Relative: 6.3 % (ref 3.0–12.0)
Neutro Abs: 3.8 10*3/uL (ref 1.4–7.7)
Neutrophils Relative %: 61.4 % (ref 43.0–77.0)
Platelets: 205 10*3/uL (ref 150.0–400.0)
RBC: 5.11 Mil/uL (ref 4.22–5.81)
RDW: 13.5 % (ref 11.5–15.5)
WBC: 6.2 10*3/uL (ref 4.0–10.5)

## 2018-09-24 LAB — BASIC METABOLIC PANEL
BUN: 9 mg/dL (ref 6–23)
CO2: 30 mEq/L (ref 19–32)
Calcium: 9.3 mg/dL (ref 8.4–10.5)
Chloride: 105 mEq/L (ref 96–112)
Creatinine, Ser: 0.98 mg/dL (ref 0.40–1.50)
GFR: 75.66 mL/min (ref >60.00)
Glucose, Bld: 99 mg/dL (ref 70–99)
Potassium: 4.1 mEq/L (ref 3.5–5.1)
Sodium: 142 mEq/L (ref 135–145)

## 2018-09-24 NOTE — Patient Instructions (Signed)
If you have medicare related insurance (such as traditional Medicare, [employer], [employer], or similar), Please make an appointment at the scheduling desk with Sharee Pimple, the [employer], for your Wellness visit in this office, which is a benefit with your insurance.     Mediterranean diet is good for you. ([employer] has a typical Mediterranean cuisine menu) The Mediterranean diet is a way of eating based on the traditional cuisine of countries bordering the [employer]. While there is no single definition of the Mediterranean diet, it is typically high in vegetables, fruits, whole grains, beans, nut and seeds, and olive oil. The main components of Mediterranean diet include: Marland Kitchen Daily consumption of vegetables, fruits, whole grains and healthy fats  . Weekly intake of fish, poultry, beans and eggs  . Moderate portions of dairy products  . Limited intake of red meat Other important elements of the Mediterranean diet are sharing meals with family and friends, enjoying a glass of red wine and being physically active. Health benefits of a Mediterranean diet: A traditional Mediterranean diet consisting of large quantities of fresh fruits and vegetables, nuts, fish and olive oil-coupled with physical activity-can reduce your risk of serious mental and physical health problems by: Preventing heart disease and strokes. Following a Mediterranean diet limits your intake of refined breads, processed foods, and red meat, and encourages drinking red wine instead of hard liquor-all factors that can help prevent heart disease and stroke. Keeping you agile. If you're an older adult, the nutrients gained with a Mediterranean diet may reduce your risk of developing muscle weakness and other signs of frailty by about 70 percent. Reducing the risk of Alzheimer's. Research suggests that the [place] diet may improve cholesterol, blood sugar levels, and overall blood vessel  health, which in turn may reduce your risk of Alzheimer's disease or dementia. Halving the risk of Parkinson's disease. The high levels of antioxidants in the Mediterranean diet can prevent cells from undergoing a damaging process called oxidative stress, thereby cutting the risk of Parkinson's disease in half. Increasing longevity. By reducing your risk of developing heart disease or cancer with the Mediterranean diet, you're reducing your risk of death at any age by 20%. Protecting against type 2 diabetes. A Mediterranean diet is rich in fiber which digests slowly, prevents huge swings in blood sugar, and can help you maintain a healthy weight.

## 2018-09-24 NOTE — Assessment & Plan Note (Signed)
Lovastatin 

## 2018-09-24 NOTE — Assessment & Plan Note (Signed)
S/p MI MV Repair  Labs

## 2018-09-24 NOTE — Assessment & Plan Note (Signed)
On Coumadin post-op

## 2018-09-24 NOTE — Progress Notes (Signed)
Subjective:  Patient ID: Brendan [name], male    DOB: 1950-10-21  Age: 68 y.o. MRN: [id number]  CC: No chief complaint on file.   HPI Brendan [name] presents for GERD, HTN, anticoagulation   The pt is 1. S/p MI MV Repair 2. Right Apical Pneumothorax  Outpatient Medications Prior to Visit  Medication Sig Dispense Refill  . acetaminophen (TYLENOL) 500 MG tablet Take 500 mg by mouth every 6 (six) hours as needed (pain.).    Marland Kitchen aspirin EC 81 MG EC tablet Take 1 tablet (81 mg total) by mouth daily.    . chlorpheniramine (CHLOR-TRIMETON) 4 MG tablet Take 4 mg by mouth daily.    Marland Kitchen glycopyrrolate (ROBINUL) 2 MG tablet Take 1 tablet (2 mg total) by mouth daily. 90 tablet 3  . lansoprazole (PREVACID) 30 MG capsule Take 1 capsule (30 mg total) by mouth daily before breakfast. 90 capsule 3  . lovastatin (MEVACOR) 20 MG tablet Take 1 tablet (20 mg total) by mouth daily. 90 tablet 3  . metoprolol tartrate (LOPRESSOR) 25 MG tablet Take 0.5 tablets (12.5 mg total) by mouth daily. 30 tablet 2  . triamcinolone ointment (KENALOG) 0.1 %     . warfarin (COUMADIN) 5 MG tablet Take 1 to 1 and 1/2 daily as directed by coumadin clinic 45 tablet 2   No facility-administered medications prior to visit.     ROS: Review of Systems  Constitutional: Positive for fatigue. Negative for appetite change and unexpected weight change.  HENT: Negative for congestion, nosebleeds, sneezing, sore throat and trouble swallowing.   Eyes: Negative for itching and visual disturbance.  Respiratory: Negative for cough and shortness of breath.   Cardiovascular: Negative for chest pain, palpitations and leg swelling.  Gastrointestinal: Negative for abdominal distention, blood in stool, diarrhea and nausea.  Genitourinary: Negative for frequency and hematuria.  Musculoskeletal: Negative for back pain, gait problem, joint swelling and neck pain.  Skin: Negative for rash.  Neurological: Negative for dizziness, tremors,  speech difficulty and weakness.  Psychiatric/Behavioral: Negative for agitation, dysphoric mood, sleep disturbance and suicidal ideas. The patient is not nervous/anxious.     Objective:  BP 120/88 (BP Location: Left Arm, Patient Position: Sitting, Cuff Size: Normal)   Pulse 90   Temp 97.6 F (36.4 C) (Oral)   Ht 5\' 9"  (1.753 m)   Wt 167 lb (75.8 kg)   SpO2 97%   BMI 24.66 kg/m   BP Readings from Last 3 Encounters:  09/24/18 120/88  07/29/18 130/81  07/28/18 113/64    Wt Readings from Last 3 Encounters:  09/24/18 167 lb (75.8 kg)  07/29/18 170 lb (77.1 kg)  07/28/18 160 lb (72.6 kg)    Physical Exam Constitutional:      General: He is not in acute distress.    Appearance: He is well-developed.     Comments: NAD  Eyes:     Conjunctiva/sclera: Conjunctivae normal.     Pupils: Pupils are equal, round, and reactive to light.  Neck:     Musculoskeletal: Normal range of motion.     Thyroid: No thyromegaly.     Vascular: No JVD.  Cardiovascular:     Rate and Rhythm: Normal rate and regular rhythm.     Heart sounds: Normal heart sounds. No murmur. No friction rub. No gallop.   Pulmonary:     Effort: Pulmonary effort is normal. No respiratory distress.     Breath sounds: Normal breath sounds. No wheezing or rales.  Chest:  Chest wall: No tenderness.  Abdominal:     General: Bowel sounds are normal. There is no distension.     Palpations: Abdomen is soft. There is no mass.     Tenderness: There is no abdominal tenderness. There is no guarding or rebound.  Musculoskeletal: Normal range of motion.        General: No tenderness.  Lymphadenopathy:     Cervical: No cervical adenopathy.  Skin:    General: Skin is warm and dry.     Findings: No rash.  Neurological:     Mental Status: He is alert and oriented to person, place, and time.     Cranial Nerves: No cranial nerve deficit.     Motor: No abnormal muscle tone.     Coordination: Coordination normal.     Gait: Gait  normal.     Deep Tendon Reflexes: Reflexes are normal and symmetric.  Psychiatric:        Behavior: Behavior normal.        Thought Content: Thought content normal.        Judgment: Judgment normal.     Lab Results  Component Value Date   WBC 10.3 07/11/2018   HGB 12.1 (L) 07/11/2018   HCT 34.7 (L) 07/11/2018   PLT 99 (L) 07/11/2018   GLUCOSE 102 (H) 07/11/2018   CHOL 149 07/22/2017   TRIG 81.0 07/22/2017   HDL 55.20 07/22/2017   LDLCALC 77 07/22/2017   ALT 14 07/03/2018   AST 20 07/03/2018   NA 137 07/11/2018   K 3.6 07/11/2018   CL 103 07/11/2018   CREATININE 0.74 07/11/2018   BUN 11 07/11/2018   CO2 26 07/11/2018   TSH 1.02 07/22/2017   PSA 0.01 (L) 07/22/2017   INR 2.6 09/07/2018   HGBA1C 5.1 07/03/2018    No results found.  Assessment & Plan:   There are no diagnoses linked to this encounter.   No orders of the defined types were placed in this encounter.    Follow-up: No follow-ups on file.  Walker Kehr, MD

## 2018-09-25 LAB — IRON,TIBC AND FERRITIN PANEL
%SAT: 14 % (calc) — ABNORMAL LOW (ref 20–48)
Ferritin: 15 ng/mL — ABNORMAL LOW (ref 24–380)
Iron: 55 ug/dL (ref 50–180)
TIBC: 383 mcg/dL (calc) (ref 250–425)

## 2018-10-05 ENCOUNTER — Ambulatory Visit (INDEPENDENT_AMBULATORY_CARE_PROVIDER_SITE_OTHER): Payer: Medicare Other | Admitting: *Deleted

## 2018-10-05 ENCOUNTER — Other Ambulatory Visit: Payer: Self-pay

## 2018-10-05 DIAGNOSIS — Z7901 Long term (current) use of anticoagulants: Secondary | ICD-10-CM

## 2018-10-05 DIAGNOSIS — Z9889 Other specified postprocedural states: Secondary | ICD-10-CM | POA: Diagnosis not present

## 2018-10-05 LAB — POCT INR: INR: 2.1 (ref 2.0–3.0)

## 2018-10-05 NOTE — Patient Instructions (Signed)
Description   Continue with 5mg  daily excpet for 7.5mg  every Tuesday and Thursday. Next INR in 4 week. Call us with any medication changes or concerns # [phone].

## 2018-10-16 ENCOUNTER — Other Ambulatory Visit: Payer: Self-pay | Admitting: Thoracic Surgery (Cardiothoracic Vascular Surgery)

## 2018-10-16 DIAGNOSIS — Z9889 Other specified postprocedural states: Secondary | ICD-10-CM

## 2018-10-19 ENCOUNTER — Other Ambulatory Visit: Payer: Self-pay

## 2018-10-19 ENCOUNTER — Ambulatory Visit: Payer: Self-pay | Admitting: Pharmacist Clinician (PhC)/ Clinical Pharmacy Specialist

## 2018-10-19 ENCOUNTER — Encounter: Payer: Self-pay | Admitting: Thoracic Surgery (Cardiothoracic Vascular Surgery)

## 2018-10-19 ENCOUNTER — Ambulatory Visit (INDEPENDENT_AMBULATORY_CARE_PROVIDER_SITE_OTHER): Payer: Medicare Other | Admitting: Thoracic Surgery (Cardiothoracic Vascular Surgery)

## 2018-10-19 ENCOUNTER — Ambulatory Visit
Admission: RE | Admit: 2018-10-19 | Discharge: 2018-10-19 | Disposition: A | Discharge status: Home / Self Care | Payer: Medicare Other | Source: Ambulatory Visit | Attending: Thoracic Surgery (Cardiothoracic Vascular Surgery) | Admitting: Thoracic Surgery (Cardiothoracic Vascular Surgery)

## 2018-10-19 ENCOUNTER — Other Ambulatory Visit: Payer: Self-pay | Admitting: Thoracic Surgery (Cardiothoracic Vascular Surgery)

## 2018-10-19 VITALS — BP 134/86 | HR 79 | Temp 97.3°F | Resp 16 | Ht 69.0 in | Wt 170.4 lb

## 2018-10-19 DIAGNOSIS — I341 Nonrheumatic mitral (valve) prolapse: Secondary | ICD-10-CM

## 2018-10-19 DIAGNOSIS — Z7901 Long term (current) use of anticoagulants: Secondary | ICD-10-CM

## 2018-10-19 DIAGNOSIS — Z9889 Other specified postprocedural states: Secondary | ICD-10-CM

## 2018-10-19 DIAGNOSIS — R079 Chest pain, unspecified: Secondary | ICD-10-CM | POA: Diagnosis not present

## 2018-10-19 NOTE — Patient Instructions (Addendum)
Schedule routine follow up echocardiogram at [facility] may stop taking Coumadin  Continue all all previous medications without any changes at this time  You may resume unrestricted physical activity without any particular limitations at this time.  Endocarditis is a potentially serious infection of heart valves or inside lining of the heart.  It occurs more commonly in patients with diseased heart valves (such as patient's with aortic or mitral valve disease) and in patients who have undergone heart valve repair or replacement.  Certain surgical and dental procedures may put you at risk, such as dental cleaning, other dental procedures, or any surgery involving the respiratory, urinary, gastrointestinal tract, gallbladder or prostate gland.   To minimize your chances for develooping endocarditis, maintain good oral health and seek prompt medical attention for any infections involving the mouth, teeth, gums, skin or urinary tract.    Always notify your doctor or dentist about your underlying heart valve condition before having any invasive procedures. You will need to take antibiotics before certain procedures, including all routine dental cleanings or other dental procedures.  Your cardiologist or dentist should prescribe these antibiotics for you to be taken ahead of time.

## 2018-10-19 NOTE — Progress Notes (Signed)
[address]       [name] [address]             [phone]     CARDIOTHORACIC SURGERY OFFICE NOTE  Primary Cardiologist is Minus Breeding, MD PCP is Plotnikov, [name], MD   HPI:  Patient is a 68 year old male who returns the office today for routine follow-up approximately 60-month status post minimally invasive mitral valve repair for mitral valve prolapse with severe symptomatic primary mitral regurgitation.  His early postoperative recovery was uneventful and he was last seen here in our office on July 29, 2018 at which time he was doing very well.  He returns to our office today reports that he continues to do extremely well.  He has mild residual soreness along the right lateral chest wall related to his surgical incision that continues to improve.  This does not bother him much at all.  He is quite active physically and he denies any symptoms of exertional shortness of breath or chest discomfort.  He is now back doing most of what he was doing before from a physical standpoint.  He has not had any palpitations.  He has been taking Coumadin and aspirin 81 mg daily.  He states he had 2 brief episodes where 2 different digits on his left hand felt numb for very brief period of time.  These resolved within seconds.  He has not had any other similar episodes and he otherwise feels better than he did prior to surgery.  He specifically denies any history of fevers, chills, night sweats, anorexia, or malaise.  He has not had a follow-up echocardiogram since his surgery.   Current Outpatient Medications  Medication Sig Dispense Refill  . acetaminophen (TYLENOL) 500 MG tablet Take 500 mg by mouth every 6 (six) hours as needed (pain.).    Marland Kitchen aspirin EC 81 MG EC tablet Take 1 tablet (81 mg total) by mouth daily.    . chlorpheniramine (CHLOR-TRIMETON) 4 MG tablet Take 4 mg by mouth daily.    Marland Kitchen glycopyrrolate (ROBINUL) 2 MG tablet Take 1 tablet (2 mg total) by mouth daily.  90 tablet 3  . lansoprazole (PREVACID) 30 MG capsule Take 1 capsule (30 mg total) by mouth daily before breakfast. 90 capsule 3  . lovastatin (MEVACOR) 20 MG tablet Take 1 tablet (20 mg total) by mouth daily. 90 tablet 3  . metoprolol tartrate (LOPRESSOR) 25 MG tablet Take 0.5 tablets (12.5 mg total) by mouth daily. 30 tablet 2  . warfarin (COUMADIN) 5 MG tablet Take 1 to 1 and 1/2 daily as directed by coumadin clinic 45 tablet 2  . triamcinolone ointment (KENALOG) 0.1 %      No current facility-administered medications for this visit.       Physical Exam:   BP 134/86 (BP Location: Right Arm, Patient Position: Sitting, Cuff Size: Normal)   Pulse 79   Temp (!) 97.3 F (36.3 C)   Resp 16   Ht 5\' 9"  (1.753 m)   Wt 170 lb 6.4 oz (77.3 kg)   SpO2 97% Comment: RA  BMI 25.16 kg/m   General:  Well-appearing  Chest:   Clear to auscultation  CV:   Regular rate and rhythm without murmur  Incisions:  Completely healed  Abdomen:  Soft nontender  Extremities:  Warm and well-perfused  Diagnostic Tests:  CHEST - 2 VIEW  COMPARISON:  Radiograph of July 29, 2018.  FINDINGS: The heart size and mediastinal contours  are within normal limits. Both lungs are clear. No pneumothorax or pleural effusion is noted. Stable findings consistent with mitral valve repair. The visualized skeletal structures are unremarkable.  IMPRESSION: No active cardiopulmonary disease.   Electronically Signed   By: Marijo Conception M.D.   On: 10/19/2018 13:58   Impression:  Patient is doing well approximately 48-month status post minimally invasive mitral valve repair  Plan:  We will request that a routine follow-up echocardiogram be performed as soon as practical.  I think the patient may stop taking Coumadin.  He will continue to take aspirin 81 mg daily.  Patient may return to unrestricted physical activity without any limitations.  The patient has been reminded regarding the importance of dental  hygiene and the lifelong need for antibiotic prophylaxis for all dental cleanings and other related invasive procedures.  The patient will continue to follow-up with Dr. Percival Spanish and return to our office for routine follow-up next June, approximately 1 year following the surgery.  I spent in excess of 15 minutes during the conduct of this office consultation and >50% of this time involved direct face-to-face encounter with the patient for counseling and/or coordination of their care.    Valentina Gu. Roxy Manns, MD 10/19/2018 2:15 PM

## 2018-10-19 NOTE — Addendum Note (Signed)
Addended by: [name] on: 10/19/2018 02:35 PM   Modules accepted: Orders

## 2018-10-23 ENCOUNTER — Encounter (HOSPITAL_COMMUNITY): Payer: Self-pay | Admitting: *Deleted

## 2018-10-23 ENCOUNTER — Ambulatory Visit (HOSPITAL_COMMUNITY): Payer: Medicare Other | Attending: Cardiovascular Disease

## 2018-10-23 ENCOUNTER — Other Ambulatory Visit: Payer: Self-pay

## 2018-10-23 DIAGNOSIS — I341 Nonrheumatic mitral (valve) prolapse: Secondary | ICD-10-CM | POA: Insufficient documentation

## 2018-10-23 NOTE — Progress Notes (Signed)
Patient has completed the virtual cardiac rehab program and will continue exercise independently at this time. Patient's virtual cardiac rehab report will be scanned to media for review.  Sol Passer, MS, ACSM CEP 10/23/2018 1010am

## 2018-10-29 NOTE — Progress Notes (Signed)
Virtual Visit via Video Note   This visit type was conducted due to national recommendations for restrictions regarding the COVID-19 Pandemic (e.g. social distancing) in an effort to limit this patient's exposure and mitigate transmission in our community.  Due to his co-morbid illnesses, this patient is at least at moderate risk for complications without adequate follow up.  This format is felt to be most appropriate for this patient at this time.  All issues noted in this document were discussed and addressed.  A limited physical exam was performed with this format.  Please refer to the patient's chart for his consent to telehealth for [facility].   Date:  10/30/2018   ID:  Brendan [name], DOB [id number], MRN [id number]  Patient Location: Home Provider Location: Home  PCP:  Cassandria Anger, MD  Cardiologist:  Minus Breeding, MD  Electrophysiologist:  None   Evaluation Performed:  Follow-Up Visit  Chief Complaint:  MV Repair  History of Present Illness:    Brendan [name] is a 68 y.o. male with who presents for follow up after minimally invasive MVR.  Marland Kitchen He has done well since he was last seen.  He denies any palpitations, presyncope or syncope.  He has had no new shortness of breath, PND or orthopnea.  He did recently see Dr. Roxy Manns.  The patient had a few weeks ago an episode of numbness or tingling of his left middle finger.  He then had a slight discoloration of his left thumb.  It was a small area that he said went away completely.  All of this was resolved when he saw Dr. Roxy Manns.  He did have an echocardiogram and I reviewed this.  There were no significant abnormalities on the echo.  There appeared to be a stable mitral valve repair.  There was trivial mitral regurgitation.  There were no other significant abnormalities.  Has had no fevers or chills.  He has had no shortness of breath.  He has had no visual or motor disturbances.  He has had absolutely no other symptoms and he  feels well.  The patient does not have symptoms concerning for COVID-19 infection (fever, chills, cough, or new shortness of breath).    Past Medical History:  Diagnosis Date  . [facility]    prostate.  skin basal cell  . Decreased hearing   . ED (erectile dysfunction)   . GERD (gastroesophageal reflux disease)   . Heart murmur   . Hyperlipidemia   . IBS (irritable bowel syndrome)   . Incidental pulmonary nodule, > 42mm and < 72mm 07/02/2018   Right lung - needs f/u CT 6 months  . MVP (mitral valve prolapse)   . Nonrheumatic mitral valve regurgitation   . Ruptured cervical disc 2008  . S/P minimally invasive mitral valve repair 07/08/2018   Complex valvuloplasty including triangular resection of posterior leaflet, artificial Gore-tex neochord placement x6 and 30 mm Sorin Memo 4D ring annuloplasty via right mini thoracotomy approach   Past Surgical History:  Procedure Laterality Date  . ACHILLES TENDON SURGERY  1980   prosthesis  . AMPUTATION FINGER / THUMB Right 12/2004   tip of thumb for strep infection  . EYE SURGERY    . MITRAL VALVE REPAIR Right 07/08/2018   Procedure: MINIMALLY INVASIVE MITRAL VALVE REPAIR (MVR);  Surgeon: [name], MD;  Location: [facility];  Service: Open Heart Surgery;  Laterality: Right;  GLUTARALDEHYDE  . PROSTATECTOMY  2007  . RIGHT/LEFT HEART CATH AND  CORONARY ANGIOGRAPHY N/A 06/17/2018   Procedure: RIGHT/LEFT HEART CATH AND CORONARY ANGIOGRAPHY;  Surgeon: Sherren Mocha, MD;  Location: [facility];  Service: Cardiovascular;  Laterality: N/A;  . TEE WITHOUT CARDIOVERSION N/A 04/06/2018   Procedure: TRANSESOPHAGEAL ECHOCARDIOGRAM (TEE);  Surgeon: Sanda Klein, MD;  Location: [facility];  Service: Cardiovascular;  Laterality: N/A;  . TEE WITHOUT CARDIOVERSION N/A 07/08/2018   Procedure: TRANSESOPHAGEAL ECHOCARDIOGRAM (TEE);  Surgeon: [name], MD;  Location: [facility];  Service: Open Heart Surgery;  Laterality: N/A;  . TONSILLECTOMY        Prior to Admission medications   Medication Sig Start Date End Date Taking? Authorizing Provider  acetaminophen (TYLENOL) 500 MG tablet Take 500 mg by mouth every 6 (six) hours as needed (pain.).   Yes [provider]  aspirin EC 81 MG EC tablet Take 1 tablet (81 mg total) by mouth daily. 07/13/18  Yes Roddenberry, Arlis Porta, PA-C  chlorpheniramine (CHLOR-TRIMETON) 4 MG tablet Take 4 mg by mouth daily.   Yes [provider]  glycopyrrolate (ROBINUL) 2 MG tablet Take 1 tablet (2 mg total) by mouth daily. 08/31/18  Yes Plotnikov, [name], MD  lansoprazole (PREVACID) 30 MG capsule Take 1 capsule (30 mg total) by mouth daily before breakfast. 08/31/18  Yes Plotnikov, [name], MD  lovastatin (MEVACOR) 20 MG tablet Take 1 tablet (20 mg total) by mouth daily. 08/31/18  Yes Plotnikov, [name], MD  metoprolol tartrate (LOPRESSOR) 25 MG tablet Take 0.5 tablets (12.5 mg total) by mouth daily. 07/29/18 07/29/19 Yes Barrett, Erin R, PA-C  hydrocortisone 2.5 % cream Apply 1 application topically daily as needed. 10/24/18   [provider]     Allergies:   Hydrocodone   Social History   Tobacco Use  . Smoking status: Never Smoker  . Smokeless tobacco: Never Used  Substance Use Topics  . Alcohol use: No    Comment: occasionally  . Drug use: No     Family Hx: The patient's family history includes Heart disease in his mother; Heart disease (age of onset: 79) in his father; Hyperlipidemia in an other family member.  ROS:   Please see the history of present illness.    As stated in the HPI and negative for all other systems.   Prior CV studies:   The following studies were reviewed today:  Echo  Labs/Other Tests and Data Reviewed:    EKG:  No ECG reviewed.  Recent Labs: 07/03/2018: ALT 14 07/09/2018: Magnesium 2.3 09/24/2018: BUN 9; Creatinine, Ser 0.98; Hemoglobin 14.1; Platelets 205.0; Potassium 4.1; Sodium 142   Recent Lipid Panel Lab Results  Component Value  Date/Time   CHOL 149 07/22/2017 10:25 AM   TRIG 81.0 07/22/2017 10:25 AM   HDL 55.20 07/22/2017 10:25 AM   CHOLHDL 3 07/22/2017 10:25 AM   LDLCALC 77 07/22/2017 10:25 AM    Wt Readings from Last 3 Encounters:  10/30/18 170 lb (77.1 kg)  10/19/18 170 lb 6.4 oz (77.3 kg)  09/24/18 167 lb (75.8 kg)     Objective:    Vital Signs:  BP 133/86   Pulse 85   Ht 5\' 9"  (1.753 m)   Wt 170 lb (77.1 kg)   BMI 25.10 kg/m    VITAL SIGNS:  reviewed  HEENT: Pupils equal round reactive to light CHEST: Well-healed right thoracotomy scar EXT: No findings PSYCH: Oriented to person place and time.  ASSESSMENT & PLAN:    MV Repair: The patient appears to have a  stable repair.  He had some concerning symptoms as above but he has had no suggestion on his exam with Dr. Roxy Manns on his echo of endocarditis or other valve related etiology.  Everything is completely resolved.  We talked about the potential for arrhythmias or endocarditis as potential cause of this though I have no clinical findings or other symptoms to suggest further work-up at this point although he will let me know if this happens again.  We did talk about getting a monitor to evaluate his heart rate so he can screen himself for this so he is having no symptomatic arrhythmias.  Dyslipidemia: No change in therapy.  COVID-19 Education: The signs and symptoms of COVID-19 were discussed with the patient and how to seek care for testing (follow up with PCP or arrange E-visit).  The patient chose to have a virtual visit.    Time:   Today, I have spent 16 minutes with the patient with telehealth technology discussing the above problems.     Medication Adjustments/Labs and Tests Ordered: Current medicines are reviewed at length with the patient today.  Concerns regarding medicines are outlined above.   Tests Ordered: No orders of the defined types were placed in this encounter.   Medication Changes: No orders of the defined types were  placed in this encounter.   Follow Up:  In Person in 2 month(s)  Signed, Minus Breeding, MD  10/30/2018 12:53 PM    [place]

## 2018-10-30 ENCOUNTER — Other Ambulatory Visit: Payer: Self-pay

## 2018-10-30 ENCOUNTER — Telehealth (INDEPENDENT_AMBULATORY_CARE_PROVIDER_SITE_OTHER): Payer: Medicare Other | Admitting: Cardiology

## 2018-10-30 ENCOUNTER — Encounter: Payer: Self-pay | Admitting: Cardiology

## 2018-10-30 VITALS — BP 133/86 | HR 85 | Ht 69.0 in | Wt 170.0 lb

## 2018-10-30 DIAGNOSIS — Z954 Presence of other heart-valve replacement: Secondary | ICD-10-CM

## 2018-10-30 DIAGNOSIS — Z9889 Other specified postprocedural states: Secondary | ICD-10-CM

## 2018-10-30 MED ORDER — METOPROLOL TARTRATE 25 MG PO TABS: 12.5000 mg | ORAL_TABLET | Freq: Every day | ORAL | 3 refills | Status: DC

## 2018-10-30 NOTE — Patient Instructions (Addendum)
Medication Instructions:  Your physician recommends that you continue on your current medications as directed. Please refer to the Current Medication list given to you today.  If you need a refill on your cardiac medications before your next appointment, please call your pharmacy.   Lab work: NONE  Testing/Procedures: NONE   Follow-Up: Your physician recommends that you schedule a follow-up appointment in: 2 months with Dr Percival Spanish 01/04/19 AT 10:20

## 2018-10-30 NOTE — Addendum Note (Signed)
Addended by: Alvina Filbert B on: 10/30/2018 01:11 PM   Modules accepted: Orders

## 2018-11-19 ENCOUNTER — Ambulatory Visit (INDEPENDENT_AMBULATORY_CARE_PROVIDER_SITE_OTHER): Payer: Medicare Other

## 2018-11-19 ENCOUNTER — Other Ambulatory Visit: Payer: Self-pay

## 2018-11-19 DIAGNOSIS — Z23 Encounter for immunization: Secondary | ICD-10-CM

## 2018-12-01 ENCOUNTER — Telehealth: Payer: Self-pay | Admitting: Internal Medicine

## 2018-12-01 NOTE — Telephone Encounter (Signed)
Patient declined AWV at this time. SF °

## 2018-12-22 ENCOUNTER — Other Ambulatory Visit: Payer: Self-pay | Admitting: *Deleted

## 2018-12-22 DIAGNOSIS — R911 Solitary pulmonary nodule: Secondary | ICD-10-CM

## 2019-01-02 NOTE — Progress Notes (Signed)
Cardiology Office Note   Date:  01/04/2019   ID:  [name], DOB [id number], MRN [id number]  PCP:  Cassandria Anger, MD  Cardiologist:   Minus Breeding, MD Referring:  Cassandria Anger, MD    Chief Complaint  Patient presents with  . MV Repair     History of Present Illness: JUSTINIAN [name] is a 68 y.o. male who is referred by Brendan, [name], MD male with who presents for follow up after minimally invasive MVR.   This was stable on echo in Sept.    Since I last saw him he has done very well.  He works in his yard.  He walks.  He might get a little short of breath walking up stairs but he is not sure that this is any different than previous.  He denies any palpitations.  He sawDr. Roxy Manns in September.  He was taken off of Coumadin.  He was scheduled for a follow-up CT because he had a pulmonary nodule at the time of surgery.  This is scheduled for next month.  He had an echocardiogram which demonstrated stable repair.   Past Medical History:  Diagnosis Date  . Cancer [facility])    prostate.  skin basal cell  . Decreased hearing   . ED (erectile dysfunction)   . GERD (gastroesophageal reflux disease)   . Heart murmur   . Hyperlipidemia   . IBS (irritable bowel syndrome)   . Incidental pulmonary nodule, > 61mm and < 79mm 07/02/2018   Right lung - needs f/u CT 6 months  . MVP (mitral valve prolapse)   . Nonrheumatic mitral valve regurgitation   . Ruptured cervical disc 2008  . S/P minimally invasive mitral valve repair 07/08/2018   Complex valvuloplasty including triangular resection of posterior leaflet, artificial Gore-tex neochord placement x6 and 30 mm Sorin Memo 4D ring annuloplasty via right mini thoracotomy approach    Past Surgical History:  Procedure Laterality Date  . ACHILLES TENDON SURGERY  1980   prosthesis  . AMPUTATION FINGER / THUMB Right 12/2004   tip of thumb for strep infection  . EYE SURGERY    . MITRAL VALVE REPAIR Right 07/08/2018   Procedure: MINIMALLY INVASIVE MITRAL VALVE REPAIR (MVR);  Surgeon: [name], MD;  Location: [place];  Service: Open Heart Surgery;  Laterality: Right;  GLUTARALDEHYDE  . PROSTATECTOMY  2007  . RIGHT/LEFT HEART CATH AND CORONARY ANGIOGRAPHY N/A 06/17/2018   Procedure: RIGHT/LEFT HEART CATH AND CORONARY ANGIOGRAPHY;  Surgeon: Sherren Mocha, MD;  Location: [place] [facility];  Service: Cardiovascular;  Laterality: N/A;  . TEE WITHOUT CARDIOVERSION N/A 04/06/2018   Procedure: TRANSESOPHAGEAL ECHOCARDIOGRAM (TEE);  Surgeon: Sanda Klein, MD;  Location: [place];  Service: Cardiovascular;  Laterality: N/A;  . TEE WITHOUT CARDIOVERSION N/A 07/08/2018   Procedure: TRANSESOPHAGEAL ECHOCARDIOGRAM (TEE);  Surgeon: [name], MD;  Location: [place];  Service: Open Heart Surgery;  Laterality: N/A;  . TONSILLECTOMY       Current Outpatient Medications  Medication Sig Dispense Refill  . acetaminophen (TYLENOL) 500 MG tablet Take 500 mg by mouth every 6 (six) hours as needed (pain.).    Marland Kitchen aspirin EC 81 MG EC tablet Take 1 tablet (81 mg total) by mouth daily.    . chlorpheniramine (CHLOR-TRIMETON) 4 MG tablet Take 4 mg by mouth daily.    Marland Kitchen glycopyrrolate (ROBINUL) 2 MG tablet Take 1 tablet (2 mg total) by mouth daily. 90 tablet 3  .  hydrocortisone 2.5 % cream Apply 1 application topically daily as needed.    . lansoprazole (PREVACID) 30 MG capsule Take 1 capsule (30 mg total) by mouth daily before breakfast. 90 capsule 3  . lovastatin (MEVACOR) 20 MG tablet Take 1 tablet (20 mg total) by mouth daily. 90 tablet 3  . metoprolol tartrate (LOPRESSOR) 25 MG tablet Take 0.5 tablets (12.5 mg total) by mouth daily. 45 tablet 3   No current facility-administered medications for this visit.     Allergies:   Hydrocodone    ROS:  Please see the history of present illness.   Otherwise, review of systems are positive for none.   All other systems are reviewed and negative.    PHYSICAL EXAM: VS:  BP  128/79   Pulse 77   Ht 5\' 9"  (1.753 m)   Wt 172 lb 9.6 oz (78.3 kg)   SpO2 100%   BMI 25.49 kg/m  , BMI Body mass index is 25.49 kg/m. GENERAL:  Well appearing NECK:  No jugular venous distention, waveform within normal limits, carotid upstroke brisk and symmetric, no bruits, no thyromegaly LUNGS:  Clear to auscultation bilaterally CHEST:  Unremarkable HEART:  PMI not displaced or sustained,S1 and S2 within normal limits, no S3, no S4, no clicks, no rubs, 3/6 holosystolic murmur at the apex and into the axilla, no diastolic murmurs ABD:  Flat, positive bowel sounds normal in frequency in pitch, no bruits, no rebound, no guarding, no midline pulsatile mass, no hepatomegaly, no splenomegaly EXT:  2 plus pulses throughout, no edema, no cyanosis no clubbing    EKG:  EKG is ordered today. The ekg ordered today demonstrates sinus rhythm, rate 70, axis within normal limits, intervals within normal limits, no acute ST-T wave changes.   Recent Labs: 07/03/2018: ALT 14 07/09/2018: Magnesium 2.3 09/24/2018: BUN 9; Creatinine, Ser 0.98; Hemoglobin 14.1; Platelets 205.0; Potassium 4.1; Sodium 142    Lipid Panel    Component Value Date/Time   CHOL 149 07/22/2017 1025   TRIG 81.0 07/22/2017 1025   HDL 55.20 07/22/2017 1025   CHOLHDL 3 07/22/2017 1025   VLDL 16.2 07/22/2017 1025   LDLCALC 77 07/22/2017 1025      Wt Readings from Last 3 Encounters:  01/04/19 172 lb 9.6 oz (78.3 kg)  10/30/18 170 lb (77.1 kg)  10/19/18 170 lb 6.4 oz (77.3 kg)      Other studies Reviewed:  Echo  1. The left ventricle has normal systolic function, with an ejection fraction of 60-65%. The cavity size was normal. Left ventricular diastolic parameters were normal. No evidence of left ventricular regional wall motion abnormalities.  2. 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 There is particularly severe prolapse of the lateral scallop of the posterior leaflet (P1), moderate prolapse of the middle  scallop of the posterior leaflet (P2).  3. The right ventricle has normal systolic function. The wall thickness and cavity size was normal.  4. Right ventricular systolic pressure is normal with an estimated pressure of 26 mmHg.  5. Moderate to severe mitral valve regurgitation. The jet is eccentric, anteriorly and medially directed.  6. Trivial aortic valve regurgitation.  7. The aortic root, ascending aorta, aortic arch and descending aorta are normal in size and structure.   ASSESSMENT AND PLAN:   MV Repair:  He had a stable repair in Sept of this year.   No change in therapy.  He understands endocarditis prophylaxis and remains on aspirin.   Dyslipidemia:  LDL 77.  The meds as listed.   Atrial fib: He has not had any symptomatic tachypalpitations.  His rhythm is regular.  I did not repeat an EKG because the last one at the time of surgery was flutter.  He has had no symptomatic recurrence of this postoperative rhythm and is in sinus.  We will continue with aspirin only.  Lung nodule: He is due to have follow-up CT.  Current medicines are reviewed at length with the patient today.  The patient does not have concerns regarding medicines.  The following changes have been made:  None  Labs/ tests ordered today include:   None  Orders Placed This Encounter  Procedures  . EKG 12-Lead     Disposition:   FU with me in one year.    Signed, Minus Breeding, MD  01/04/2019 11:05 AM    [place]

## 2019-01-04 ENCOUNTER — Other Ambulatory Visit: Payer: Self-pay

## 2019-01-04 ENCOUNTER — Encounter: Payer: Self-pay | Admitting: Cardiology

## 2019-01-04 ENCOUNTER — Ambulatory Visit (INDEPENDENT_AMBULATORY_CARE_PROVIDER_SITE_OTHER): Payer: Medicare Other | Admitting: Cardiology

## 2019-01-04 VITALS — BP 128/79 | HR 77 | Ht 69.0 in | Wt 172.6 lb

## 2019-01-04 DIAGNOSIS — E785 Hyperlipidemia, unspecified: Secondary | ICD-10-CM | POA: Diagnosis not present

## 2019-01-04 DIAGNOSIS — I341 Nonrheumatic mitral (valve) prolapse: Secondary | ICD-10-CM | POA: Diagnosis not present

## 2019-01-04 NOTE — Patient Instructions (Signed)
Medication Instructions:  Your physician recommends that you continue on your current medications as directed. Please refer to the Current Medication list given to you today.  If you need a refill on your cardiac medications before your next appointment, please call your pharmacy.   Lab work: NONE  Testing/Procedures: NONE  Follow-Up: At [facility], you and your health needs are our priority.  As part of our continuing mission to provide you with exceptional heart care, we have created designated Provider Care Teams.  These Care Teams include your primary Cardiologist (physician) and Advanced Practice Providers (APPs -  Physician Assistants and Nurse Practitioners) who all work together to provide you with the care you need, when you need it. You may see Minus Breeding, MD or one of the following Advanced Practice Providers on your designated Care Team:    Rosaria Ferries, PA-C  Jory Sims, DNP, ANP  Cadence Kathlen Mody, NP  Your physician wants you to follow-up in: 1 year. You will receive a reminder letter in the mail two months in advance. If you don't receive a letter, please call our office to schedule the follow-up appointment.

## 2019-01-11 DIAGNOSIS — H52201 Unspecified astigmatism, right eye: Secondary | ICD-10-CM | POA: Diagnosis not present

## 2019-01-11 DIAGNOSIS — H2513 Age-related nuclear cataract, bilateral: Secondary | ICD-10-CM | POA: Diagnosis not present

## 2019-01-11 DIAGNOSIS — H5201 Hypermetropia, right eye: Secondary | ICD-10-CM | POA: Diagnosis not present

## 2019-01-11 DIAGNOSIS — H524 Presbyopia: Secondary | ICD-10-CM | POA: Diagnosis not present

## 2019-01-18 ENCOUNTER — Other Ambulatory Visit: Payer: Self-pay

## 2019-01-18 ENCOUNTER — Telehealth (INDEPENDENT_AMBULATORY_CARE_PROVIDER_SITE_OTHER): Payer: Medicare Other | Admitting: Thoracic Surgery (Cardiothoracic Vascular Surgery)

## 2019-01-18 ENCOUNTER — Ambulatory Visit
Admission: RE | Admit: 2019-01-18 | Discharge: 2019-01-18 | Disposition: A | Discharge status: Home / Self Care | Payer: Medicare Other | Source: Ambulatory Visit | Attending: Thoracic Surgery (Cardiothoracic Vascular Surgery) | Admitting: Thoracic Surgery (Cardiothoracic Vascular Surgery)

## 2019-01-18 DIAGNOSIS — Z9889 Other specified postprocedural states: Secondary | ICD-10-CM

## 2019-01-18 DIAGNOSIS — R911 Solitary pulmonary nodule: Secondary | ICD-10-CM

## 2019-01-18 NOTE — Progress Notes (Signed)
[address]       [place] [address]             [phone]     CARDIOTHORACIC SURGERY TELEPHONE VIRTUAL OFFICE NOTE  Primary Cardiologist is Minus Breeding, MD PCP is Plotnikov, [name], MD   HPI:  I spoke with Brendan [name] (DOB [id number] ) via telephone on 01/18/2019 at 2:04 PM and verified that I was speaking with the correct person using more than one form of identification.  We discussed the reason(s) for conducting our visit virtually instead of in-person.  The patient expressed understanding the circumstances and agreed to proceed as described.   Patient is a 68 year old male who underwent minimally invasive mitral valve repair for mitral valve prolapse with severe symptomatic primary mitral regurgitation on July 08, 2018.  Prior to surgery he underwent routine CT angiography which revealed 2 small benign-appearing pulmonary nodules in the right lung.  I spoke with the patient over the telephone today to discuss results of the noncontrast CT scan of the chest which he had performed today for follow-up of these benign lung nodules.  The patient tells me that he has been doing very well clinically.  Specifically he has no problems with exertional shortness of breath or chest discomfort and he is back to normal activity.  Follow-up echocardiogram performed October 23, 2018 revealed normal left ventricular function with intact mitral valve repair and no residual mitral regurgitation.   Current Outpatient Medications  Medication Sig Dispense Refill  . acetaminophen (TYLENOL) 500 MG tablet Take 500 mg by mouth every 6 (six) hours as needed (pain.).    Marland Kitchen aspirin EC 81 MG EC tablet Take 1 tablet (81 mg total) by mouth daily.    . chlorpheniramine (CHLOR-TRIMETON) 4 MG tablet Take 4 mg by mouth daily.    Marland Kitchen glycopyrrolate (ROBINUL) 2 MG tablet Take 1 tablet (2 mg total) by mouth daily. 90 tablet 3  . hydrocortisone 2.5 % cream Apply 1 application topically  daily as needed.    . lansoprazole (PREVACID) 30 MG capsule Take 1 capsule (30 mg total) by mouth daily before breakfast. 90 capsule 3  . lovastatin (MEVACOR) 20 MG tablet Take 1 tablet (20 mg total) by mouth daily. 90 tablet 3  . metoprolol tartrate (LOPRESSOR) 25 MG tablet Take 0.5 tablets (12.5 mg total) by mouth daily. 45 tablet 3   No current facility-administered medications for this visit.     Diagnostic Tests:    ECHOCARDIOGRAM REPORT       Patient Name:   Brendan [name] Date of Exam: 10/23/2018 Medical Rec #:  [id number]        Height:       69.0 in Accession #:    [id number]       Weight:       170.4 lb Date of Birth:  1950/09/05        BSA:          1.93 m Patient Age:    28 years         BP:           134/86 mmHg Patient Gender: M                HR:           77 bpm. Exam Location:  [address]  Procedure: 2D Echo, Cardiac Doppler and Color Doppler  Indications:    I34.1   History:  Patient has prior history of Echocardiogram examinations, most                 recent 03/17/2018. Mitral Valve Prolapse and S/p Mitral repair                 (68mm Sorin Memo 4D ring) Risk Factors:Dyslipidemia.   Sonographer:    Coralyn Helling RDCS Referring Phys: [place]    1. Left ventricular ejection fraction, by visual estimation, is 60 to 65%. The left ventricle has normal function. Normal left ventricular size. There is no left ventricular hypertrophy.  2. Global right ventricle has normal systolic function.The right ventricular size is normal. No increase in right ventricular wall thickness.  3. Left atrial size was normal.  4. Right atrial size was normal.  5. The mitral valve has been repaired/replaced. Trace mitral valve regurgitation. No evidence of mitral stenosis.  6. Post MV repair with 30 mm Sorin Memo rign and 6 artificial Gorte tex neochrods Trivial residual MR.  7. The tricuspid valve is normal in structure. Tricuspid valve  regurgitation is mild.  8. The aortic valve is tricuspid Aortic valve regurgitation was not visualized by color flow Doppler. Mild aortic valve sclerosis without stenosis.  9. The pulmonic valve was grossly normal. Pulmonic valve regurgitation is mild by color flow Doppler. 10. Mildly elevated pulmonary artery systolic pressure. 11. The inferior vena cava is normal in size with greater than 50% respiratory variability, suggesting right atrial pressure of 3 mmHg.  FINDINGS  Left Ventricle: Left ventricular ejection fraction, by visual estimation, is 60 to 65%. The left ventricle has normal function. There is no left ventricular hypertrophy. Normal left ventricular size.  Right Ventricle: The right ventricular size is normal. No increase in right ventricular wall thickness. Global RV systolic function is has normal systolic function. The tricuspid regurgitant velocity is 2.30 m/s, and with an assumed right atrial pressure  of 10 mmHg, the estimated right ventricular systolic pressure is mildly elevated at 31.2 mmHg.  Left Atrium: Left atrial size was normal in size.  Right Atrium: Right atrial size was normal in size  Pericardium: There is no evidence of pericardial effusion.  Mitral Valve: The mitral valve has been repaired/replaced. No evidence of mitral valve stenosis by observation. Trace mitral valve regurgitation. Post MV repair with 30 mm Sorin Memo rign and 6 artificial Gorte tex neochrods Trivial residual MR.  Tricuspid Valve: The tricuspid valve is normal in structure. Tricuspid valve regurgitation is mild by color flow Doppler.  Aortic Valve: The aortic valve is tricuspid. Aortic valve regurgitation was not visualized by color flow Doppler. Mild aortic valve sclerosis is present, with no evidence of aortic valve stenosis.  Pulmonic Valve: The pulmonic valve was grossly normal. Pulmonic valve regurgitation is mild by color flow Doppler.  Aorta: The aortic root, ascending  aorta and aortic arch are all structurally normal, with no evidence of dilitation or obstruction.  Venous: The inferior vena cava is normal in size with greater than 50% respiratory variability, suggesting right atrial pressure of 3 mmHg.  IAS/Shunts: No atrial level shunt detected by color flow Doppler. No ventricular septal defect is seen or detected. There is no evidence of an atrial septal defect.     LEFT VENTRICLE          Normals PLAX 2D LVIDd:         5.20 cm  3.6 cm   Diastology  Normals LVIDs:         3.70 cm  1.7 cm   LV e' lateral:   7.29 cm/s 6.42 cm/s LV PW:         0.70 cm  1.4 cm   LV E/e' lateral: 15.2      15.4 LV IVS:        0.90 cm  1.3 cm   LV e' medial:    6.20 cm/s 6.96 cm/s LVOT diam:     2.20 cm  2.0 cm   LV E/e' medial:  17.9      6.96 LV SV:         71 ml    79 ml LV SV Index:   36.62    45 ml/m2 LVOT Area:     3.80 cm 3.14 cm2    RIGHT VENTRICLE RV S prime:     9.46 cm/s TAPSE (M-mode): 1.8 cm RVSP:           24.2 mmHg  LEFT ATRIUM           Index       RIGHT ATRIUM           Index LA diam:      2.90 cm 1.50 cm/m  RA Pressure: 3.00 mmHg LA Vol (A2C): 39.1 ml 20.26 ml/m RA Area:     17.70 cm LA Vol (A4C): 41.1 ml 21.29 ml/m RA Volume:   56.40 ml  29.22 ml/m  AORTIC VALVE             Normals LVOT Vmax:   106.00 cm/s LVOT Vmean:  65.000 cm/s 75 cm/s LVOT VTI:    0.199 m     25.3 cm   AORTA                 Normals Ao Root diam: 3.50 cm 31 mm Ao Asc diam:  3.40 cm 31 mm  MITRAL VALVE            Normals      TRICUSPID VALVE             Normals MV Area (PHT):                       TR Peak grad:   21.2 mmHg                         55 ms        TR Vmax:        250.00 cm/s 288 cm/s MV Decel Time: 264 msec 187 ms       Estimated RAP:  3.00 mmHg MV E velocity: 111.00 cm/s 103 cm/s  RVSP:           24.2 mmHg MV A velocity: 134.00 cm/s 70.3 cm/s MV E/A ratio:  0.83        1.5       SHUNTS                                       Systemic VTI:  0.20 m                                      Systemic Diam: 2.20 cm    Jenkins Rouge MD Electronically signed  by Jenkins Rouge MD Signature Date/Time: 10/23/2018/3:33:41 PM      CT CHEST WITHOUT CONTRAST  TECHNIQUE: Multidetector CT imaging of the chest was performed following the standard protocol without IV contrast.  COMPARISON:  07/03/2018  FINDINGS: Cardiovascular: Normal heart size. Interval mitral valve repair. No pericardial effusion. Minor coronary calcification.  Mediastinum/Nodes: Few calcified lymph nodes are present likely reflecting sequelae of prior granulomatous disease. Thyroid and esophagus are unremarkable. There is no adenopathy.  Lungs/Pleura: A 5 x 5 mm nodule is again identified (remeasured on prior) within the right lower lobe on series 8, image 105. A 4 mm subpleural nodule of the right lower lobe is also again seen and remain stable (series 8, image 83). Minimal distal sub 3 mm nodules are identified. There is no new nodule or consolidation. No pleural effusion. There is pleural calcification along the right lung base.  Upper Abdomen: Stable low-attenuation right posterior hepatic lesion, which is likely benign.  Musculoskeletal: Degenerative changes of the included spine.  IMPRESSION: Stable size of right lower lobe pulmonary nodules. See follow up recommendations on prior study.   Electronically Signed   By: Macy Mis M.D.   On: 01/18/2019 13:03   Impression:  2 small benign-appearing lung nodules which remain stable in comparison with the CT scan performed May 2020.  Patient is a non-smoker and at low risk for lung cancer.  Clinically the patient is otherwise doing very well.  Plan:  Patient will return to our office for routine follow-up next June, approximately 1 year following his surgery.  We discussed the results of his CT scan and feel that because he is so low risk no further imaging is  necessary.    I discussed limitations of evaluation and management via telephone.  The patient was advised to call back for repeat telephone consultation or to seek an in-person evaluation if questions arise or the patient's clinical condition changes in any significant manner.  I spent in excess of 5 minutes of non-face-to-face time during the conduct of this telephone virtual office consultation.    Valentina Gu. Roxy Manns, MD 01/18/2019 2:04 PM

## 2019-01-18 NOTE — Patient Instructions (Signed)
Continue all previous medications without any changes at this time  

## 2019-01-25 ENCOUNTER — Ambulatory Visit (INDEPENDENT_AMBULATORY_CARE_PROVIDER_SITE_OTHER): Payer: Medicare Other | Admitting: Internal Medicine

## 2019-01-25 ENCOUNTER — Encounter: Payer: Self-pay | Admitting: Internal Medicine

## 2019-01-25 ENCOUNTER — Other Ambulatory Visit: Payer: Self-pay

## 2019-01-25 DIAGNOSIS — E785 Hyperlipidemia, unspecified: Secondary | ICD-10-CM

## 2019-01-25 DIAGNOSIS — J309 Allergic rhinitis, unspecified: Secondary | ICD-10-CM

## 2019-01-25 DIAGNOSIS — R911 Solitary pulmonary nodule: Secondary | ICD-10-CM | POA: Diagnosis not present

## 2019-01-25 DIAGNOSIS — Z8546 Personal history of malignant neoplasm of prostate: Secondary | ICD-10-CM

## 2019-01-25 LAB — BASIC METABOLIC PANEL
BUN: 14 mg/dL (ref 6–23)
CO2: 31 mEq/L (ref 19–32)
Calcium: 9.5 mg/dL (ref 8.4–10.5)
Chloride: 104 mEq/L (ref 96–112)
Creatinine, Ser: 0.97 mg/dL (ref 0.40–1.50)
GFR: 76.49 mL/min (ref >60.00)
Glucose, Bld: 89 mg/dL (ref 70–99)
Potassium: 4.1 mEq/L (ref 3.5–5.1)
Sodium: 142 mEq/L (ref 135–145)

## 2019-01-25 LAB — LIPID PANEL
Cholesterol: 179 mg/dL (ref 0–200)
HDL: 47.8 mg/dL (ref >39.00)
LDL Cholesterol: 102 mg/dL — ABNORMAL HIGH (ref 0–99)
NonHDL: 130.86
Total CHOL/HDL Ratio: 4
Triglycerides: 146 mg/dL (ref 0.0–149.0)
VLDL: 29.2 mg/dL (ref 0.0–40.0)

## 2019-01-25 LAB — PSA: PSA: 0.01 ng/mL — ABNORMAL LOW (ref 0.10–4.00)

## 2019-01-25 LAB — HEPATIC FUNCTION PANEL
ALT: 10 U/L (ref 0–53)
AST: 15 U/L (ref 0–37)
Albumin: 4.6 g/dL (ref 3.5–5.2)
Alkaline Phosphatase: 60 U/L (ref 39–117)
Bilirubin, Direct: 0.1 mg/dL (ref 0.0–0.3)
Total Bilirubin: 0.9 mg/dL (ref 0.2–1.2)
Total Protein: 6.6 g/dL (ref 6.0–8.3)

## 2019-01-25 NOTE — Progress Notes (Signed)
Subjective:  Patient ID: Brendan [name], male    DOB: 1950-09-19  Age: 68 y.o. MRN: [id number]  CC: No chief complaint on file.   HPI Izell Kisler Gerry presents for CAD, dyslipidemia, HTN f/u  Outpatient Medications Prior to Visit  Medication Sig Dispense Refill  . acetaminophen (TYLENOL) 500 MG tablet Take 500 mg by mouth every 6 (six) hours as needed (pain.).    Marland Kitchen aspirin EC 81 MG EC tablet Take 1 tablet (81 mg total) by mouth daily.    . chlorpheniramine (CHLOR-TRIMETON) 4 MG tablet Take 4 mg by mouth daily.    . cholecalciferol (VITAMIN D) 25 MCG (1000 UT) tablet     . glycopyrrolate (ROBINUL) 2 MG tablet Take 1 tablet (2 mg total) by mouth daily. 90 tablet 3  . hydrocortisone 2.5 % cream Apply 1 application topically daily as needed.    . lansoprazole (PREVACID) 30 MG capsule Take 1 capsule (30 mg total) by mouth daily before breakfast. 90 capsule 3  . lovastatin (MEVACOR) 20 MG tablet Take 1 tablet (20 mg total) by mouth daily. 90 tablet 3  . metoprolol tartrate (LOPRESSOR) 25 MG tablet Take 0.5 tablets (12.5 mg total) by mouth daily. 45 tablet 3   No facility-administered medications prior to visit.    ROS: Review of Systems  Constitutional: Negative for appetite change, fatigue and unexpected weight change.  HENT: Positive for congestion. Negative for nosebleeds, sneezing, sore throat and trouble swallowing.   Eyes: Negative for itching and visual disturbance.  Respiratory: Negative for cough.   Cardiovascular: Negative for chest pain, palpitations and leg swelling.  Gastrointestinal: Negative for abdominal distention, blood in stool, diarrhea and nausea.  Genitourinary: Negative for frequency and hematuria.  Musculoskeletal: Negative for back pain, gait problem, joint swelling and neck pain.  Skin: Negative for rash.  Neurological: Negative for dizziness, tremors, speech difficulty and weakness.  Psychiatric/Behavioral: Negative for agitation, dysphoric mood and sleep  disturbance. The patient is not nervous/anxious.     Objective:  BP 134/82 (BP Location: Left Arm, Patient Position: Sitting, Cuff Size: Normal)   Pulse 77   Temp 98 F (36.7 C) (Oral)   Ht 5\' 9"  (1.753 m)   Wt 170 lb (77.1 kg)   SpO2 96%   BMI 25.10 kg/m   BP Readings from Last 3 Encounters:  01/25/19 134/82  01/04/19 128/79  10/30/18 133/86    Wt Readings from Last 3 Encounters:  01/25/19 170 lb (77.1 kg)  01/04/19 172 lb 9.6 oz (78.3 kg)  10/30/18 170 lb (77.1 kg)    Physical Exam Constitutional:      General: He is not in acute distress.    Appearance: He is well-developed.     Comments: NAD  Eyes:     Conjunctiva/sclera: Conjunctivae normal.     Pupils: Pupils are equal, round, and reactive to light.  Neck:     Thyroid: No thyromegaly.     Vascular: No JVD.  Cardiovascular:     Rate and Rhythm: Normal rate and regular rhythm.     Heart sounds: Normal heart sounds. No murmur. No friction rub. No gallop.   Pulmonary:     Effort: Pulmonary effort is normal. No respiratory distress.     Breath sounds: Normal breath sounds. No wheezing or rales.  Chest:     Chest wall: No tenderness.  Abdominal:     General: Bowel sounds are normal. There is no distension.     Palpations: Abdomen is soft. There  is no mass.     Tenderness: There is no abdominal tenderness. There is no guarding or rebound.  Musculoskeletal:        General: No tenderness. Normal range of motion.     Cervical back: Normal range of motion.  Lymphadenopathy:     Cervical: No cervical adenopathy.  Skin:    General: Skin is warm and dry.     Findings: No rash.  Neurological:     Mental Status: He is alert and oriented to person, place, and time.     Cranial Nerves: No cranial nerve deficit.     Motor: No abnormal muscle tone.     Coordination: Coordination normal.     Gait: Gait normal.     Deep Tendon Reflexes: Reflexes are normal and symmetric.  Psychiatric:        Behavior: Behavior normal.         Thought Content: Thought content normal.        Judgment: Judgment normal.     Lab Results  Component Value Date   WBC 6.2 09/24/2018   HGB 14.1 09/24/2018   HCT 42.7 09/24/2018   PLT 205.0 09/24/2018   GLUCOSE 99 09/24/2018   CHOL 149 07/22/2017   TRIG 81.0 07/22/2017   HDL 55.20 07/22/2017   LDLCALC 77 07/22/2017   ALT 14 07/03/2018   AST 20 07/03/2018   NA 142 09/24/2018   K 4.1 09/24/2018   CL 105 09/24/2018   CREATININE 0.98 09/24/2018   BUN 9 09/24/2018   CO2 30 09/24/2018   TSH 1.02 07/22/2017   PSA 0.01 (L) 07/22/2017   INR 2.1 10/05/2018   HGBA1C 5.1 07/03/2018    CT CHEST WO CONTRAST  Result Date: 01/18/2019 CLINICAL DATA:  Pulmonary nodule, follow-up EXAM: CT CHEST WITHOUT CONTRAST TECHNIQUE: Multidetector CT imaging of the chest was performed following the standard protocol without IV contrast. COMPARISON:  07/03/2018 FINDINGS: Cardiovascular: Normal heart size. Interval mitral valve repair. No pericardial effusion. Minor coronary calcification. Mediastinum/Nodes: Few calcified lymph nodes are present likely reflecting sequelae of prior granulomatous disease. Thyroid and esophagus are unremarkable. There is no adenopathy. Lungs/Pleura: A 5 x 5 mm nodule is again identified (remeasured on prior) within the right lower lobe on series 8, image 105. A 4 mm subpleural nodule of the right lower lobe is also again seen and remain stable (series 8, image 83). Minimal distal sub 3 mm nodules are identified. There is no new nodule or consolidation. No pleural effusion. There is pleural calcification along the right lung base. Upper Abdomen: Stable low-attenuation right posterior hepatic lesion, which is likely benign. Musculoskeletal: Degenerative changes of the included spine. IMPRESSION: Stable size of right lower lobe pulmonary nodules. See follow up recommendations on prior study. Electronically Signed   By: Macy Mis M.D.   On: 01/18/2019 13:03    Assessment &  Plan:   There are no diagnoses linked to this encounter.   No orders of the defined types were placed in this encounter.    Follow-up: No follow-ups on file.  Walker Kehr, MD

## 2019-01-25 NOTE — Assessment & Plan Note (Signed)
Dr Roxy Manns - appt in 6 mo Right lung - needs f/u CT 6 months; 2020 -ok

## 2019-01-25 NOTE — Assessment & Plan Note (Signed)
Lovastatin   Labs

## 2019-01-25 NOTE — Assessment & Plan Note (Signed)
PSA

## 2019-01-25 NOTE — Assessment & Plan Note (Signed)
Chlorpheniramine 4 mg qd

## 2019-03-23 DIAGNOSIS — L7211 Pilar cyst: Secondary | ICD-10-CM | POA: Diagnosis not present

## 2019-03-23 DIAGNOSIS — L820 Inflamed seborrheic keratosis: Secondary | ICD-10-CM | POA: Diagnosis not present

## 2019-03-23 DIAGNOSIS — Z85828 Personal history of other malignant neoplasm of skin: Secondary | ICD-10-CM | POA: Diagnosis not present

## 2019-03-23 DIAGNOSIS — L570 Actinic keratosis: Secondary | ICD-10-CM | POA: Diagnosis not present

## 2019-03-23 DIAGNOSIS — D485 Neoplasm of uncertain behavior of skin: Secondary | ICD-10-CM | POA: Diagnosis not present

## 2019-03-23 DIAGNOSIS — L821 Other seborrheic keratosis: Secondary | ICD-10-CM | POA: Diagnosis not present

## 2019-03-23 DIAGNOSIS — D225 Melanocytic nevi of trunk: Secondary | ICD-10-CM | POA: Diagnosis not present

## 2019-03-23 DIAGNOSIS — C44719 Basal cell carcinoma of skin of left lower limb, including hip: Secondary | ICD-10-CM | POA: Diagnosis not present

## 2019-07-19 ENCOUNTER — Ambulatory Visit: Payer: Medicare Other | Admitting: Thoracic Surgery (Cardiothoracic Vascular Surgery)

## 2019-07-26 ENCOUNTER — Ambulatory Visit: Payer: Medicare Other | Admitting: Internal Medicine

## 2019-07-26 ENCOUNTER — Ambulatory Visit (INDEPENDENT_AMBULATORY_CARE_PROVIDER_SITE_OTHER): Payer: Medicare Other | Admitting: Thoracic Surgery (Cardiothoracic Vascular Surgery)

## 2019-07-26 ENCOUNTER — Encounter: Payer: Self-pay | Admitting: Thoracic Surgery (Cardiothoracic Vascular Surgery)

## 2019-07-26 ENCOUNTER — Other Ambulatory Visit: Payer: Self-pay

## 2019-07-26 VITALS — BP 128/70 | HR 71 | Temp 97.6°F | Resp 20 | Ht 69.0 in | Wt 176.0 lb

## 2019-07-26 DIAGNOSIS — R911 Solitary pulmonary nodule: Secondary | ICD-10-CM

## 2019-07-26 DIAGNOSIS — Z9889 Other specified postprocedural states: Secondary | ICD-10-CM

## 2019-07-26 NOTE — Patient Instructions (Signed)

## 2019-07-26 NOTE — Progress Notes (Signed)
[address]       [place] [address]             [phone]     CARDIOTHORACIC SURGERY OFFICE NOTE  Primary Cardiologist is Brendan Breeding, MD PCP is Brendan [name], Brendan Lacks, MD   HPI:  Patient is a 69 year old male who returns the office today for routine follow-up approximately 1 year status post minimally invasive mitral valve repair for mitral valve prolapse with severe symptomatic primary mitral regurgitation on July 08, 2018.  His postoperative recovery was uneventful and routine follow-up echocardiogram performed October 23, 2018 revealed intact mitral valve repair with no residual mitral regurgitation and normal left ventricular systolic function.  Prior to surgery CT angiography of the chest revealed 2 small benign-appearing pulmonary nodules in the right lung.  Follow-up CT scan performed 6 months later revealed stable size and appearance of the pulmonary nodules.  The patient returns to our office today for routine follow-up.  He states that he is doing remarkably well.  Overall he states that he feels "much improved" in comparison with how he felt prior to mitral valve repair.  This is despite the fact that symptoms prior to surgery were relatively mild in severity.  Since surgery he notes that he really feels much better and is able to do more without getting short of breath or tired.  He states that he only gets short of breath with very strenuous exertion.  He has never had any chest pain or chest tightness.  He has remained in good health otherwise and overall is delighted with his progress.   Current Outpatient Medications  Medication Sig Dispense Refill  . acetaminophen (TYLENOL) 500 MG tablet Take 500 mg by mouth every 6 (six) hours as needed (pain.).    Marland Kitchen aspirin EC 81 MG EC tablet Take 1 tablet (81 mg total) by mouth daily.    . chlorpheniramine (CHLOR-TRIMETON) 4 MG tablet Take 4 mg by mouth daily.    . cholecalciferol (VITAMIN D) 25 MCG (1000 UT)  tablet     . glycopyrrolate (ROBINUL) 2 MG tablet Take 1 tablet (2 mg total) by mouth daily. 90 tablet 3  . hydrocortisone 2.5 % cream Apply 1 application topically daily as needed.    . lansoprazole (PREVACID) 30 MG capsule Take 1 capsule (30 mg total) by mouth daily before breakfast. 90 capsule 3  . lovastatin (MEVACOR) 20 MG tablet Take 1 tablet (20 mg total) by mouth daily. 90 tablet 3  . metoprolol tartrate (LOPRESSOR) 25 MG tablet Take 0.5 tablets (12.5 mg total) by mouth daily. 45 tablet 3   No current facility-administered medications for this visit.      Physical Exam:   BP 128/70   Pulse 71   Temp 97.6 F (36.4 C) (Skin)   Resp 20   Ht 5\' 9"  (1.753 m)   Wt 176 lb (79.8 kg)   PF 97 L/min Comment: RA  BMI 25.99 kg/m   General:  Well-appearing  Chest:   Clear to auscultation  CV:   Regular rate and rhythm without murmur  Incisions:  Completely healed  Abdomen:  Soft nontender  Extremities:  Warm and well-perfused  Diagnostic Tests:  n/a   Impression:  Patient is doing very well approximately 1 year status post minimally invasive mitral valve repair  Plan:  Patient will continue to follow-up with Brendan [name] for long-term medical follow up and surveillance following mitral valve repair.  The patient has  requested that we perform 1 more follow-up CT scan of the chest in 1 year to make sure that the benign-appearing nodules in the right lung remains stable.  We have not recommended any changes to his current medications.  The patient has been reminded regarding the importance of dental hygiene and the lifelong need for antibiotic prophylaxis for all dental cleanings and other related invasive procedures.    I spent in excess of 15 minutes during the conduct of this office consultation and >50% of this time involved direct face-to-face encounter with the patient for counseling and/or coordination of their care.    Brendan Gu. Roxy Manns, MD 07/26/2019 2:52 PM

## 2019-07-28 ENCOUNTER — Ambulatory Visit (INDEPENDENT_AMBULATORY_CARE_PROVIDER_SITE_OTHER): Payer: Medicare Other | Admitting: Internal Medicine

## 2019-07-28 ENCOUNTER — Other Ambulatory Visit: Payer: Self-pay

## 2019-07-28 ENCOUNTER — Other Ambulatory Visit (INDEPENDENT_AMBULATORY_CARE_PROVIDER_SITE_OTHER): Payer: Medicare Other

## 2019-07-28 ENCOUNTER — Encounter: Payer: Self-pay | Admitting: Internal Medicine

## 2019-07-28 VITALS — BP 142/96 | HR 74 | Temp 98.4°F | Ht 69.0 in | Wt 173.0 lb

## 2019-07-28 DIAGNOSIS — Z8546 Personal history of malignant neoplasm of prostate: Secondary | ICD-10-CM

## 2019-07-28 DIAGNOSIS — E785 Hyperlipidemia, unspecified: Secondary | ICD-10-CM | POA: Diagnosis not present

## 2019-07-28 DIAGNOSIS — I340 Nonrheumatic mitral (valve) insufficiency: Secondary | ICD-10-CM

## 2019-07-28 DIAGNOSIS — Z7901 Long term (current) use of anticoagulants: Secondary | ICD-10-CM

## 2019-07-28 DIAGNOSIS — R911 Solitary pulmonary nodule: Secondary | ICD-10-CM

## 2019-07-28 DIAGNOSIS — Z0000 Encounter for general adult medical examination without abnormal findings: Secondary | ICD-10-CM

## 2019-07-28 LAB — BASIC METABOLIC PANEL
BUN: 14 mg/dL (ref 6–23)
CO2: 31 mEq/L (ref 19–32)
Calcium: 9.6 mg/dL (ref 8.4–10.5)
Chloride: 104 mEq/L (ref 96–112)
Creatinine, Ser: 1.03 mg/dL (ref 0.40–1.50)
GFR: 71.26 mL/min (ref >60.00)
Glucose, Bld: 89 mg/dL (ref 70–99)
Potassium: 3.9 mEq/L (ref 3.5–5.1)
Sodium: 140 mEq/L (ref 135–145)

## 2019-07-28 LAB — LIPID PANEL
Cholesterol: 184 mg/dL (ref 0–200)
HDL: 45.8 mg/dL (ref >39.00)
LDL Cholesterol: 104 mg/dL — ABNORMAL HIGH (ref 0–99)
NonHDL: 138.07
Total CHOL/HDL Ratio: 4
Triglycerides: 168 mg/dL — ABNORMAL HIGH (ref 0.0–149.0)
VLDL: 33.6 mg/dL (ref 0.0–40.0)

## 2019-07-28 LAB — PSA: PSA: 0 ng/mL — ABNORMAL LOW (ref 0.10–4.00)

## 2019-07-28 MED ORDER — LOVASTATIN 20 MG PO TABS: 20.0000 mg | ORAL_TABLET | Freq: Every day | ORAL | 3 refills | Status: DC

## 2019-07-28 MED ORDER — GLYCOPYRROLATE 2 MG PO TABS: 2.0000 mg | ORAL_TABLET | Freq: Every day | ORAL | 3 refills | Status: DC

## 2019-07-28 MED ORDER — LANSOPRAZOLE 30 MG PO CPDR: 30.0000 mg | DELAYED_RELEASE_CAPSULE | Freq: Every day | ORAL | 3 refills | Status: DC

## 2019-07-28 MED ORDER — METOPROLOL TARTRATE 25 MG PO TABS: 12.5000 mg | ORAL_TABLET | Freq: Every day | ORAL | 3 refills | Status: DC

## 2019-07-28 MED ORDER — ACETAMINOPHEN 500 MG PO TABS: 500.0000 mg | ORAL_TABLET | Freq: Four times a day (QID) | ORAL | 3 refills | Status: AC | PRN

## 2019-07-28 NOTE — Assessment & Plan Note (Signed)
F/u w/Dr Borden 

## 2019-07-28 NOTE — Progress Notes (Signed)
Subjective:  Patient ID: Brendan [name], male    DOB: [birth year]  Age: 69 y.o. MRN: [id number]  CC: No chief complaint on file.   HPI Brendan [name] presents for dyslipidemia, prostate cancer, allergies f/u Cologuard (-) 2019    Outpatient Medications Prior to Visit  Medication Sig Dispense Refill  . aspirin EC 81 MG EC tablet Take 1 tablet (81 mg total) by mouth daily.    . chlorpheniramine (CHLOR-TRIMETON) 4 MG tablet Take 4 mg by mouth daily.    . cholecalciferol (VITAMIN D) 25 MCG (1000 UT) tablet     . hydrocortisone 2.5 % cream Apply 1 application topically daily as needed.    Marland Kitchen acetaminophen (TYLENOL) 500 MG tablet Take 500 mg by mouth every 6 (six) hours as needed (pain.).    Marland Kitchen glycopyrrolate (ROBINUL) 2 MG tablet Take 1 tablet (2 mg total) by mouth daily. 90 tablet 3  . lansoprazole (PREVACID) 30 MG capsule Take 1 capsule (30 mg total) by mouth daily before breakfast. 90 capsule 3  . lovastatin (MEVACOR) 20 MG tablet Take 1 tablet (20 mg total) by mouth daily. 90 tablet 3  . metoprolol tartrate (LOPRESSOR) 25 MG tablet Take 0.5 tablets (12.5 mg total) by mouth daily. 45 tablet 3   No facility-administered medications prior to visit.    ROS: Review of Systems  Constitutional: Negative for appetite change, fatigue and unexpected weight change.  HENT: Negative for congestion, nosebleeds, sneezing, sore throat and trouble swallowing.   Eyes: Negative for itching and visual disturbance.  Respiratory: Negative for cough.   Cardiovascular: Negative for chest pain, palpitations and leg swelling.  Gastrointestinal: Negative for abdominal distention, blood in stool, diarrhea and nausea.  Genitourinary: Negative for frequency and hematuria.  Musculoskeletal: Negative for back pain, gait problem, joint swelling and neck pain.  Skin: Negative for rash.  Neurological: Negative for dizziness, tremors, speech difficulty and weakness.  Psychiatric/Behavioral: Negative for  agitation, dysphoric mood, sleep disturbance and suicidal ideas. The patient is not nervous/anxious.     Objective:  BP (!) 142/96 (BP Location: Right Arm, Patient Position: Sitting, Cuff Size: Normal)   Pulse 74   Temp 98.4 F (36.9 C) (Oral)   Ht 5\' 9"  (1.753 m)   Wt 173 lb (78.5 kg)   SpO2 97%   BMI 25.55 kg/m   BP Readings from Last 3 Encounters:  07/28/19 (!) 142/96  07/26/19 128/70  01/25/19 134/82    Wt Readings from Last 3 Encounters:  07/28/19 173 lb (78.5 kg)  07/26/19 176 lb (79.8 kg)  01/25/19 170 lb (77.1 kg)    Physical Exam Constitutional:      General: He is not in acute distress.    Appearance: He is well-developed.     Comments: NAD  Eyes:     Conjunctiva/sclera: Conjunctivae normal.     Pupils: Pupils are equal, round, and reactive to light.  Neck:     Thyroid: No thyromegaly.     Vascular: No JVD.  Cardiovascular:     Rate and Rhythm: Normal rate and regular rhythm.     Heart sounds: Normal heart sounds. No murmur heard.  No friction rub. No gallop.   Pulmonary:     Effort: Pulmonary effort is normal. No respiratory distress.     Breath sounds: Normal breath sounds. No wheezing or rales.  Chest:     Chest wall: No tenderness.  Abdominal:     General: Bowel sounds are normal. There is no distension.  Palpations: Abdomen is soft. There is no mass.     Tenderness: There is no abdominal tenderness. There is no guarding or rebound.  Musculoskeletal:        General: No tenderness. Normal range of motion.     Cervical back: Normal range of motion.  Lymphadenopathy:     Cervical: No cervical adenopathy.  Skin:    General: Skin is warm and dry.     Findings: No rash.  Neurological:     Mental Status: He is alert and oriented to person, place, and time.     Cranial Nerves: No cranial nerve deficit.     Motor: No abnormal muscle tone.     Coordination: Coordination normal.     Gait: Gait normal.     Deep Tendon Reflexes: Reflexes are normal  and symmetric.  Psychiatric:        Behavior: Behavior normal.        Thought Content: Thought content normal.        Judgment: Judgment normal.     Lab Results  Component Value Date   WBC 6.2 09/24/2018   HGB 14.1 09/24/2018   HCT 42.7 09/24/2018   PLT 205.0 09/24/2018   GLUCOSE 89 01/25/2019   CHOL 179 01/25/2019   TRIG 146.0 01/25/2019   HDL 47.80 01/25/2019   LDLCALC 102 (H) 01/25/2019   ALT 10 01/25/2019   AST 15 01/25/2019   NA 142 01/25/2019   K 4.1 01/25/2019   CL 104 01/25/2019   CREATININE 0.97 01/25/2019   BUN 14 01/25/2019   CO2 31 01/25/2019   TSH 1.02 07/22/2017   PSA 0.01 (L) 01/25/2019   INR 2.1 10/05/2018   HGBA1C 5.1 07/03/2018    CT CHEST WO CONTRAST  Result Date: 01/18/2019 CLINICAL DATA:  Pulmonary nodule, follow-up EXAM: CT CHEST WITHOUT CONTRAST TECHNIQUE: Multidetector CT imaging of the chest was performed following the standard protocol without IV contrast. COMPARISON:  07/03/2018 FINDINGS: Cardiovascular: Normal heart size. Interval mitral valve repair. No pericardial effusion. Minor coronary calcification. Mediastinum/Nodes: Few calcified lymph nodes are present likely reflecting sequelae of prior granulomatous disease. Thyroid and esophagus are unremarkable. There is no adenopathy. Lungs/Pleura: A 5 x 5 mm nodule is again identified (remeasured on prior) within the right lower lobe on series 8, image 105. A 4 mm subpleural nodule of the right lower lobe is also again seen and remain stable (series 8, image 83). Minimal distal sub 3 mm nodules are identified. There is no new nodule or consolidation. No pleural effusion. There is pleural calcification along the right lung base. Upper Abdomen: Stable low-attenuation right posterior hepatic lesion, which is likely benign. Musculoskeletal: Degenerative changes of the included spine. IMPRESSION: Stable size of right lower lobe pulmonary nodules. See follow up recommendations on prior study. Electronically  Signed   By: Macy Mis M.D.   On: 01/18/2019 13:03    Assessment & Plan:   There are no diagnoses linked to this encounter.   Meds ordered this encounter  Medications  . acetaminophen (TYLENOL) 500 MG tablet    Sig: Take 1 tablet (500 mg total) by mouth every 6 (six) hours as needed (pain.).    Dispense:  30 tablet    Refill:  3  . metoprolol tartrate (LOPRESSOR) 25 MG tablet    Sig: Take 0.5 tablets (12.5 mg total) by mouth daily.    Dispense:  45 tablet    Refill:  3  . lovastatin (MEVACOR) 20 MG tablet  Sig: Take 1 tablet (20 mg total) by mouth daily.    Dispense:  90 tablet    Refill:  3    Hold until patient need refills  . lansoprazole (PREVACID) 30 MG capsule    Sig: Take 1 capsule (30 mg total) by mouth daily before breakfast.    Dispense:  90 capsule    Refill:  3  . glycopyrrolate (ROBINUL) 2 MG tablet    Sig: Take 1 tablet (2 mg total) by mouth daily.    Dispense:  90 tablet    Refill:  3    Hold until patient need refills     Follow-up: No follow-ups on file.  Walker Kehr, MD

## 2019-07-28 NOTE — Assessment & Plan Note (Signed)
S/p MI MV Repair

## 2019-07-28 NOTE — Assessment & Plan Note (Signed)
Off Coumadin

## 2019-07-28 NOTE — Assessment & Plan Note (Signed)
CT in 1 year

## 2019-09-27 DIAGNOSIS — Z85828 Personal history of other malignant neoplasm of skin: Secondary | ICD-10-CM | POA: Diagnosis not present

## 2019-09-27 DIAGNOSIS — L812 Freckles: Secondary | ICD-10-CM | POA: Diagnosis not present

## 2019-09-27 DIAGNOSIS — L570 Actinic keratosis: Secondary | ICD-10-CM | POA: Diagnosis not present

## 2019-09-27 DIAGNOSIS — L821 Other seborrheic keratosis: Secondary | ICD-10-CM | POA: Diagnosis not present

## 2019-09-27 DIAGNOSIS — D1801 Hemangioma of skin and subcutaneous tissue: Secondary | ICD-10-CM | POA: Diagnosis not present

## 2019-12-03 ENCOUNTER — Ambulatory Visit (INDEPENDENT_AMBULATORY_CARE_PROVIDER_SITE_OTHER): Payer: Medicare Other

## 2019-12-03 ENCOUNTER — Other Ambulatory Visit: Payer: Self-pay

## 2019-12-03 DIAGNOSIS — Z23 Encounter for immunization: Secondary | ICD-10-CM

## 2019-12-13 IMAGING — CT CT ANGIOGRAPHY ABDOMEN AND PELVIS WITH CONTRAST AND WITHOUT CONT
2 of 8 series · 11 of 46 positions shown, 12 images · IV contrast (iopamidol)
Comparison: Chest radiograph April 11, 2009

CLINICAL DATA: Chest and abdominal pain.  Mitral regurgitation

EXAM:
CT ANGIOGRAPHY CHEST, ABDOMEN AND PELVIS
TECHNIQUE: Multidetector CT imaging through the chest, abdomen and pelvis was
performed using the standard protocol during bolus administration of
intravenous contrast. Multiplanar reconstructed images and MIPs were
obtained and reviewed to evaluate the vascular anatomy.
CONTRAST:  75mL PXHDF2-BO7 IOPAMIDOL (PXHDF2-BO7) INJECTION 76%

[Series 5: cta cap aneurysm 2.00 bv36 s3 axial arterial · axial · arterial · 0.78mm/px · z∈[+1157,+1731]mm · 8 of 341 slices shown, 9 images]
[im 27/341  soft-tissue]
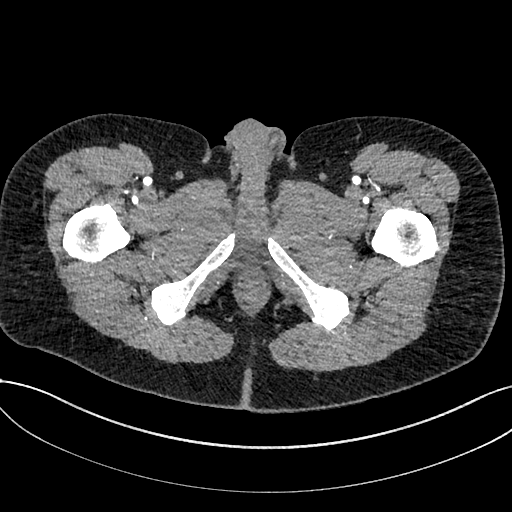
[im 27/341  bone]
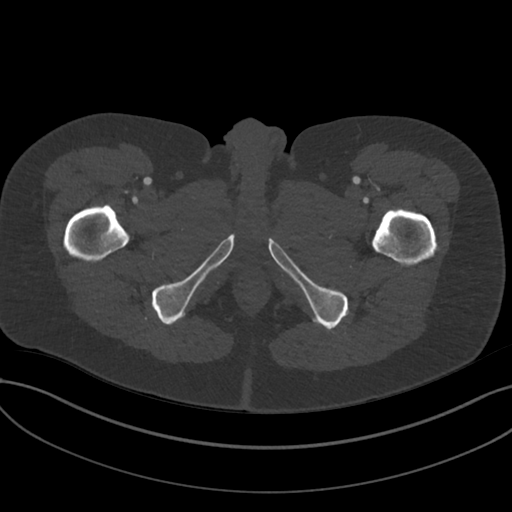
[im 79/341  soft-tissue]
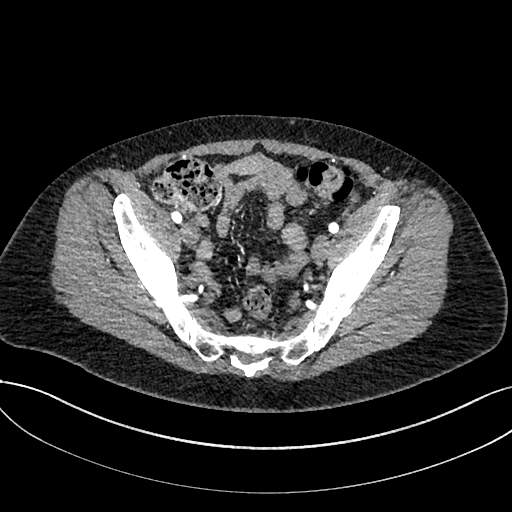
[im 105/341  soft-tissue]
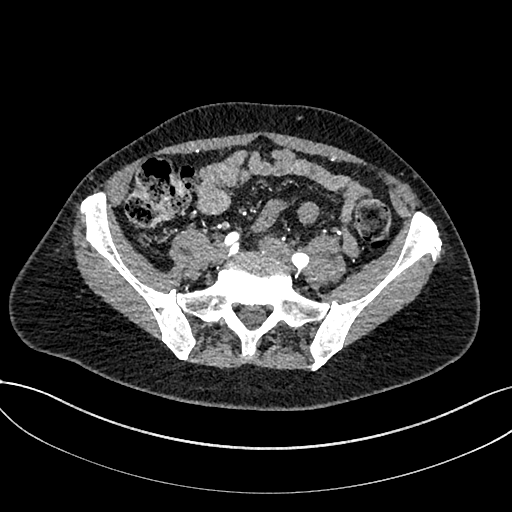
[im 157/341  soft-tissue]
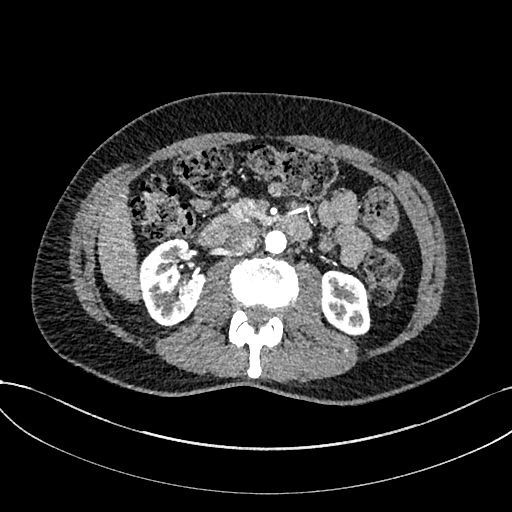
[im 184/341  soft-tissue]
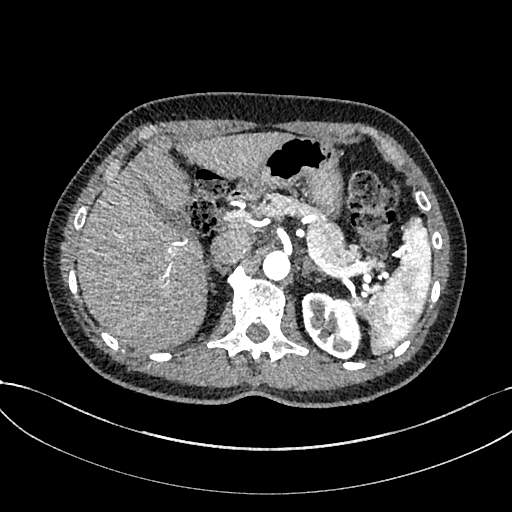
[im 236/341  soft-tissue]
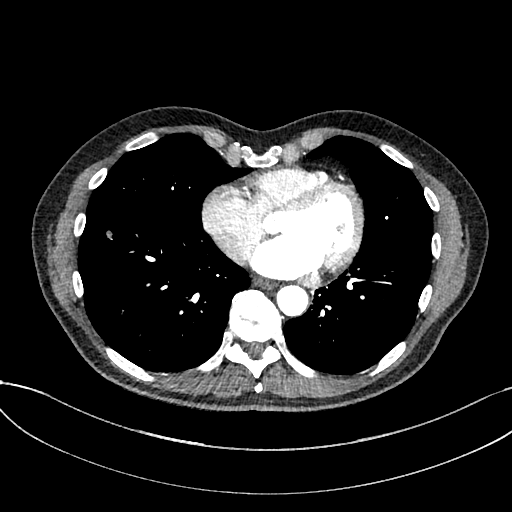
[im 262/341  soft-tissue]
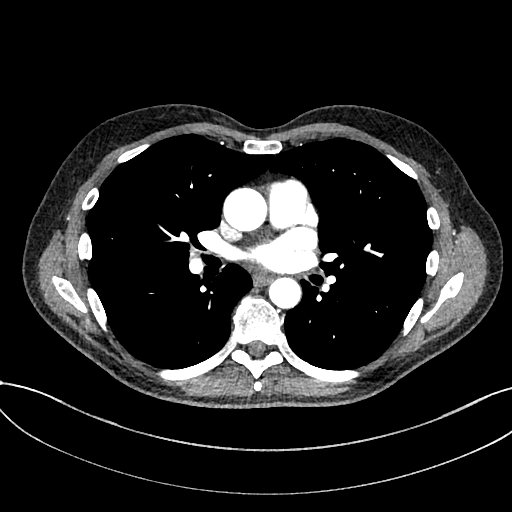
[im 314/341  soft-tissue]
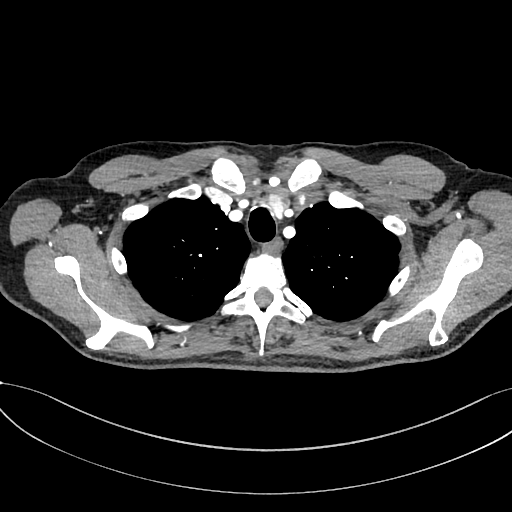

[Series 7: cta cap aneurysm 2.00 bv36 s3 cor cor st · coronal · 0.78mm/px · 3 of 134 slices shown]
[im 34/134  soft-tissue]
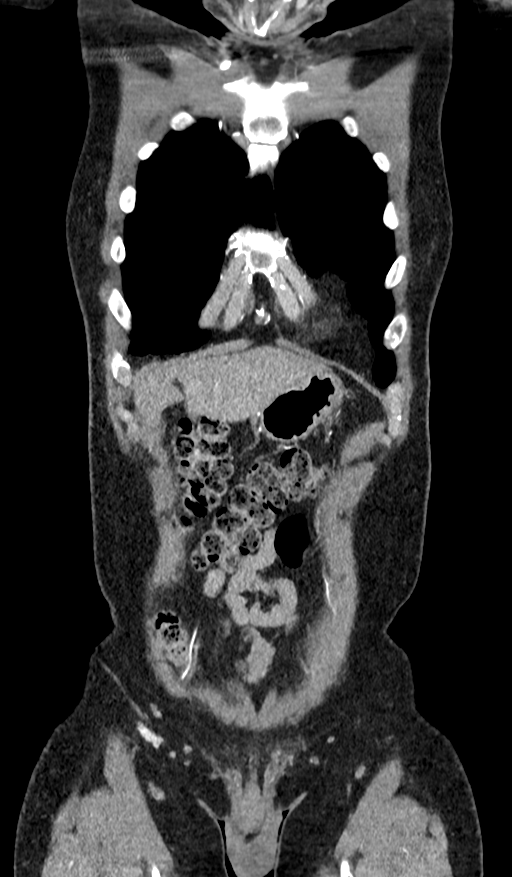
[im 67/134  soft-tissue]
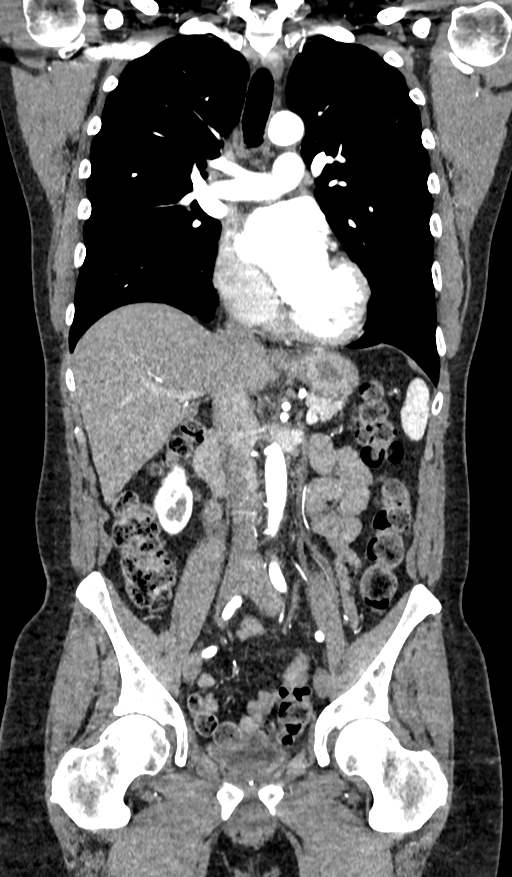
[im 100/134  soft-tissue]
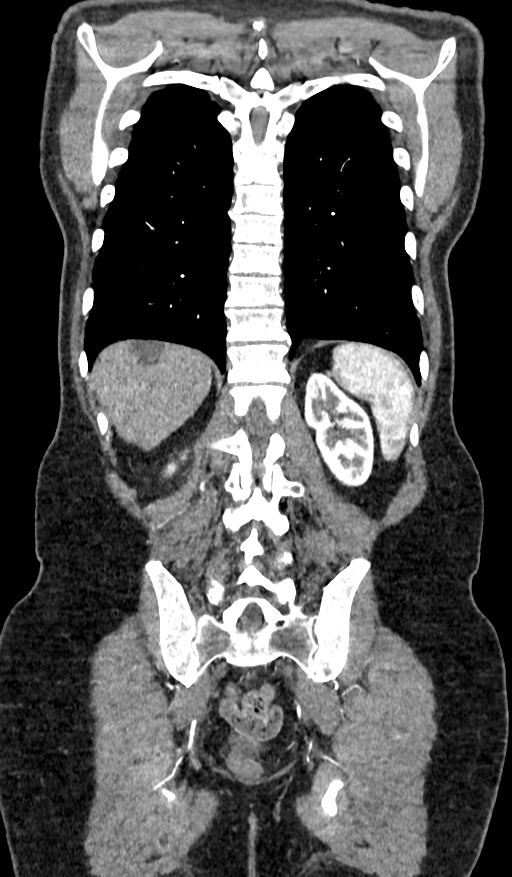

[11 of 46 positions shown; findings below may reference images not displayed]

FINDINGS: CTA CHEST FINDINGS

Cardiovascular: There is no thoracic aortic aneurysm or dissection.
No appreciable thoracic aortic atherosclerotic changes noted.
Visualized great vessels appear normal. There is no demonstrable
pulmonary embolus. There is no evident pericardial effusion or
pericardial thickening. The overall cardiac chamber sizes appear
within normal limits. No mitral valve calcification evident.

Mediastinum/Nodes: Thyroid appears unremarkable. There is no evident
thoracic adenopathy. No esophageal lesions are appreciable.

Lungs/Pleura: There is no evident edema or consolidation. On axial
slice 73 series 5, there is a 5 mm nodular opacity arising from the
posterior aspect of the superior segment of the right lower lobe. On
axial slice 106 series 5, there is a nodular opacity in the anterior
segment of the right upper lobe measuring 7 x 6 mm. No evident
pleural effusion or pleural thickening.

Musculoskeletal: There is degenerative change in the thoracic spine.
There are no blastic or lytic bone lesions. There is no evident
chest wall lesion. There is a small foramen of Bochdalek hernia on
the left posteriorly containing only fat.

Review of the MIP images confirms the above findings.

CTA ABDOMEN AND PELVIS FINDINGS

VASCULAR

Aorta: There is no abdominal aortic aneurysm or dissection. No
appreciable abdominal aortic atherosclerotic change.

Celiac: The celiac artery is mildly tortuous. The celiac artery and
its branches appear widely patent without appreciable
atherosclerotic plaque. No aneurysm or dissection involving the
celiac artery and its branches.

SMA: Superior mesenteric artery and its branches are widely patent.
No aneurysm or dissection evident.

Renals: There is a single renal artery on the left. There are 2
renal arteries arising from the aorta. The renal arteries and their
branches appear widely patent in all segments. No aneurysm or
dissection. No evident fibromuscular dysplasia.

IMA: Inferior mesenteric artery and its branches appear widely
patent. No aneurysm or dissection.

Inflow: The pelvic arterial vessels are widely patent throughout
their respective courses. No aneurysm or dissection is evident.
Proximal superficial and profunda femoral artery branches are widely
patent without aneurysm or dissection.

Veins: No obvious venous abnormality within the limitations of this
arterial phase study.

Review of the MIP images confirms the above findings.

NON-VASCULAR

Hepatobiliary: There is a benign-appearing lesion in the posterior
segment of the right lobe of the liver near the dome measuring 2.6 x
2.0 cm. It has attenuation values slightly higher than is expected
with a cyst. There is slight increase in attenuation in this lesion
on delayed imaging. It does not have typical hemangioma appearance.
No other focal liver lesions are evident. Gallbladder wall is not
appreciably thickened. No biliary duct dilatation evident.

Pancreas: There is no pancreatic mass or inflammatory focus.

Spleen: No splenic lesions are evident.

Adrenals/Urinary Tract: Adrenals bilaterally appear unremarkable.
Kidneys bilaterally show no evident mass or hydronephrosis on either
side. There is no demonstrable renal or ureteral calculus on either
side. Urinary bladder is midline with wall thickness within normal
limits.

Stomach/Bowel: There is no appreciable bowel wall or mesenteric
thickening. There is moderate stool throughout the colon. No evident
bowel obstruction. Terminal ileum appears unremarkable.

Lymphatic: No evident adenopathy in the abdomen or pelvis.

Reproductive: Prostate absent.  No evident pelvic mass.

Other: Appendix appears unremarkable. No demonstrable abscess or
ascites in the abdomen or pelvis.

Musculoskeletal: There is disc degeneration at L3-4. No blastic or
lytic bone lesions are evident. No intramuscular or abdominal wall
lesions are appreciable.

Review of the MIP images confirms the above findings.
IMPRESSION: CT angiogram chest:

1. No thoracic aortic aneurysm or dissection. No appreciable aortic
atherosclerosis.

2.  No demonstrable pulmonary embolus.

3. Nodular opacities in the right lung, largest measuring 7 x 6 mm.
Non-contrast chest CT at 6-12 months is recommended. If the nodule
is stable at time of repeat CT, then future CT at 18-24 months (from
today's scan) is considered optional for low-risk patients, but is
recommended for high-risk patients. This recommendation follows the
consensus statement: Guidelines for Management of Incidental
Pulmonary Nodules Detected on CT Images: From the [HOSPITAL]

4.  No demonstrable thoracic adenopathy.

CT angiogram abdomen; CT angiogram pelvis:

1. No appreciable aneurysm or dissection involving the aorta, major
mesenteric, and major pelvic arterial vessels. No appreciable
atherosclerotic plaque noted in these vessels. No stenosis evident
in these vessels.

2. Benign-appearing lesion in the right lobe of the liver near the
dome posteriorly. This lesion does not represent a typical
hemangioma. If further imaging is felt to be advisable, pre and
serial post-contrast MR or CT of the liver could be helpful to
further evaluate.

3.  Prostate absent.

4. No bowel obstruction. No abscess in the abdomen or pelvis.
Appendix appears normal.

5. No evident renal or ureteral calculus. No hydronephrosis. Urinary
bladder wall thickness normal.

## 2020-01-09 NOTE — Progress Notes (Signed)
Cardiology Office Note   Date:  01/10/2020   ID:  Brendan [name], DOB [birth year], MRN [id number]  PCP:  Brendan Anger, MD  Cardiologist:   Brendan Breeding, MD Referring:  Brendan Anger, MD    Chief Complaint  Patient presents with  . Mitral Valve Repair     History of Present Illness: Brendan [name] is a 69 y.o. male who is referred by Brendan [name], Brendan Lacks, MD male with who presents for follow up after minimally invasive MVR.   This was stable on echo in Sept 2020.    Since I last saw him he has done well.  He denies any cardiovascular symptoms.  He stays active.  He walks 2 to 3 miles a day most days.  He does have a stationary bike in his home. The patient denies any new symptoms such as chest discomfort, neck or arm discomfort. There has been no new shortness of breath, PND or orthopnea. There have been no reported palpitations, presyncope or syncope.    Past Medical History:  Diagnosis Date  . Cancer [facility])    prostate.  skin basal cell  . Decreased hearing   . ED (erectile dysfunction)   . GERD (gastroesophageal reflux disease)   . Heart murmur   . Hyperlipidemia   . IBS (irritable bowel syndrome)   . Incidental pulmonary nodule, > 73mm and < 42mm 07/02/2018   Right lung - needs f/u CT 6 months  . MVP (mitral valve prolapse)   . Nonrheumatic mitral valve regurgitation   . Ruptured cervical disc 2008  . S/P minimally invasive mitral valve repair 07/08/2018   Complex valvuloplasty including triangular resection of posterior leaflet, artificial Gore-tex neochord placement x6 and 30 mm Sorin Memo 4D ring annuloplasty via right mini thoracotomy approach    Past Surgical History:  Procedure Laterality Date  . ACHILLES TENDON SURGERY  1980   prosthesis  . AMPUTATION FINGER / THUMB Right 12/2004   tip of thumb for strep infection  . EYE SURGERY    . MITRAL VALVE REPAIR Right 07/08/2018   Procedure: MINIMALLY INVASIVE MITRAL VALVE REPAIR (MVR);  Surgeon: [name], MD;  Location: [place];  Service: Open Heart Surgery;  Laterality: Right;  GLUTARALDEHYDE  . PROSTATECTOMY  2007  . RIGHT/LEFT HEART CATH AND CORONARY ANGIOGRAPHY N/A 06/17/2018   Procedure: RIGHT/LEFT HEART CATH AND CORONARY ANGIOGRAPHY;  Surgeon: Sherren Mocha, MD;  Location: [place] [facility];  Service: Cardiovascular;  Laterality: N/A;  . TEE WITHOUT CARDIOVERSION N/A 04/06/2018   Procedure: TRANSESOPHAGEAL ECHOCARDIOGRAM (TEE);  Surgeon: Sanda Klein, MD;  Location: [place];  Service: Cardiovascular;  Laterality: N/A;  . TEE WITHOUT CARDIOVERSION N/A 07/08/2018   Procedure: TRANSESOPHAGEAL ECHOCARDIOGRAM (TEE);  Surgeon: [name], MD;  Location: [place];  Service: Open Heart Surgery;  Laterality: N/A;  . TONSILLECTOMY       Current Outpatient Medications  Medication Sig Dispense Refill  . acetaminophen (TYLENOL) 500 MG tablet Take 1 tablet (500 mg total) by mouth every 6 (six) hours as needed (pain.). 30 tablet 3  . aspirin EC 81 MG EC tablet Take 1 tablet (81 mg total) by mouth daily.    . chlorpheniramine (CHLOR-TRIMETON) 4 MG tablet Take 4 mg by mouth daily.    . Cholecalciferol (VITAMIN D3) 50 MCG (2000 UT) TABS 2,000 Units.     Marland Kitchen glycopyrrolate (ROBINUL) 2 MG tablet Take 1 tablet (2 mg total) by mouth daily. 90 tablet 3  .  hydrocortisone 2.5 % cream Apply 1 application topically daily as needed.    . lansoprazole (PREVACID) 30 MG capsule Take 1 capsule (30 mg total) by mouth daily before breakfast. 90 capsule 3  . lovastatin (MEVACOR) 20 MG tablet Take 1 tablet (20 mg total) by mouth daily. 90 tablet 3  . metoprolol tartrate (LOPRESSOR) 25 MG tablet Take 0.5 tablets (12.5 mg total) by mouth daily. 45 tablet 3   No current facility-administered medications for this visit.    Allergies:   Hydrocodone    ROS:  Please see the history of present illness.   Otherwise, review of systems are positive for none.   All other systems are reviewed and negative.     PHYSICAL EXAM: VS:  BP 138/76   Pulse 78   Ht 5\' 9"  (1.753 m)   Wt 180 lb (81.6 kg)   SpO2 99%   BMI 26.58 kg/m  , BMI Body mass index is 26.58 kg/m. GENERAL:  Well appearing NECK:  No jugular venous distention, waveform within normal limits, carotid upstroke brisk and symmetric, no bruits, no thyromegaly LUNGS:  Clear to auscultation bilaterally CHEST: Well-healed surgical scars HEART:  PMI not displaced or sustained,S1 and S2 within normal limits, no S3, no S4, no clicks, no rubs, soft apical nonradiating systolic murmur, no diastolic murmurs ABD:  Flat, positive bowel sounds normal in frequency in pitch, no bruits, no rebound, no guarding, no midline pulsatile mass, no hepatomegaly, no splenomegaly EXT:  2 plus pulses throughout, no edema, no cyanosis no clubbing  EKG:  EKG is  ordered today The ekg ordered today demonstrates sinus rhythm, rate 78, axis within normal limits, intervals within normal limits, no acute ST-T wave changes.   Recent Labs: 01/25/2019: ALT 10 07/28/2019: BUN 14; Creatinine, Ser 1.03; Potassium 3.9; Sodium 140    Lipid Panel    Component Value Date/Time   CHOL 184 07/28/2019 0908   TRIG 168.0 (H) 07/28/2019 0908   HDL 45.80 07/28/2019 0908   CHOLHDL 4 07/28/2019 0908   VLDL 33.6 07/28/2019 0908   LDLCALC 104 (H) 07/28/2019 0908      Wt Readings from Last 3 Encounters:  01/10/20 180 lb (81.6 kg)  07/28/19 173 lb (78.5 kg)  07/26/19 176 lb (79.8 kg)      ASSESSMENT AND PLAN:   MV Repair:  Is has been stable as of September last year.  There is a slight murmur.  I will check an echocardiogram in September of this year to follow-up to repair.  He understands endocarditis prophylaxis.   Dyslipidemia:  LDL is a little bit higher than it was at 104.  Had previously been 30.  His triglycerides are slightly elevated at 168.  We talked about limiting the simple carbohydrates and other dietary factors that can help with both readings.  Atrial  fib:   This is postop.  He has not had any symptomatic arrhythmias since then.  No change in therapy.  Lung nodule:    These were stable on follow up CT 1 year ago.  He requests another follow-up which is reasonable.  I will order a noncontrasted CT and if stable this would be the last that he would need.   Current medicines are reviewed at length with the patient today.  The patient does not have concerns regarding medicines.  The following changes have been made:  None  Labs/ tests ordered today include:     Orders Placed This Encounter  Procedures  . CT Chest  Wo Contrast  . EKG 12-Lead  . ECHOCARDIOGRAM COMPLETE     Disposition:   FU with me in one year.    Signed, Brendan Breeding, MD  01/10/2020 9:50 AM    [place]

## 2020-01-10 ENCOUNTER — Ambulatory Visit (INDEPENDENT_AMBULATORY_CARE_PROVIDER_SITE_OTHER): Payer: Medicare Other | Admitting: Cardiology

## 2020-01-10 ENCOUNTER — Other Ambulatory Visit: Payer: Self-pay

## 2020-01-10 ENCOUNTER — Encounter: Payer: Self-pay | Admitting: Cardiology

## 2020-01-10 VITALS — BP 138/76 | HR 78 | Ht 69.0 in | Wt 180.0 lb

## 2020-01-10 DIAGNOSIS — I340 Nonrheumatic mitral (valve) insufficiency: Secondary | ICD-10-CM

## 2020-01-10 DIAGNOSIS — R911 Solitary pulmonary nodule: Secondary | ICD-10-CM | POA: Diagnosis not present

## 2020-01-10 DIAGNOSIS — E785 Hyperlipidemia, unspecified: Secondary | ICD-10-CM | POA: Diagnosis not present

## 2020-01-10 DIAGNOSIS — Z9889 Other specified postprocedural states: Secondary | ICD-10-CM | POA: Diagnosis not present

## 2020-01-10 NOTE — Patient Instructions (Signed)
Medication Instructions:  No changes *If you need a refill on your cardiac medications before your next appointment, please call your pharmacy*  Lab Work: None ordered  Testing/Procedures: EKG  Non-Contrast Chest CT  Your physician has requested that you have an echocardiogram in September. Echocardiography is a painless test that uses sound waves to create images of your heart. It provides your doctor with information about the size and shape of your heart and how well your heart's chambers and valves are working. This procedure takes approximately one hour. There are no restrictions for this procedure. [address]  Follow-Up: At [facility], you and your health needs are our priority.  As part of our continuing mission to provide you with exceptional heart care, we have created designated Provider Care Teams.  These Care Teams include your primary Cardiologist (physician) and Advanced Practice Providers (APPs -  Physician Assistants and Nurse Practitioners) who all work together to provide you with the care you need, when you need it.  Your next appointment:   12 month(s)  You will receive a reminder letter in the mail two months in advance. If you don't receive a letter, please call our office to schedule the follow-up appointment.  The format for your next appointment:   In Person  Provider:   Minus Breeding, MD

## 2020-01-12 DIAGNOSIS — H25813 Combined forms of age-related cataract, bilateral: Secondary | ICD-10-CM | POA: Diagnosis not present

## 2020-01-24 ENCOUNTER — Telehealth (INDEPENDENT_AMBULATORY_CARE_PROVIDER_SITE_OTHER): Payer: Medicare Other | Admitting: Family

## 2020-01-24 DIAGNOSIS — J019 Acute sinusitis, unspecified: Secondary | ICD-10-CM | POA: Diagnosis not present

## 2020-01-24 MED ORDER — AMOXICILLIN-POT CLAVULANATE 875-125 MG PO TABS: 1.0000 | ORAL_TABLET | Freq: Two times a day (BID) | ORAL | 0 refills | Status: AC

## 2020-01-24 NOTE — Progress Notes (Signed)
Brendan [name] is a 69 y.o. male with the following history as recorded in EpicCare:  Patient Active Problem List   Diagnosis Date Noted  . Absolute anemia 09/24/2018  . Anticoagulated 09/24/2018  . Long term (current) use of anticoagulants 07/16/2018  . S/P minimally invasive mitral valve repair 07/08/2018  . Incidental pulmonary nodule, > 6mm and < 14mm 07/02/2018  . Tick bite 06/25/2018  . Nonrheumatic mitral valve regurgitation   . Heart murmur 03/02/2018  . Leg pain, inferior, right 03/02/2018  . External otitis of left ear 07/15/2017  . Acute URI 03/29/2015  . Cellulitis of left buttock 01/18/2013  . Hearing loss of aging 08/03/2012  . Well adult exam 09/02/2011  . Acute conjunctivitis, left eye 07/16/2011  . FEVER UNSPECIFIED 04/11/2009  . Cough 04/11/2009  . Dyslipidemia 07/28/2007  . Allergic rhinitis 07/28/2007  . GERD 07/28/2007  . ERECTILE DYSFUNCTION 07/28/2007  . PROSTATE CANCER, HX OF 07/28/2007  . SKIN CANCER, HX OF 07/28/2007    Current Outpatient Medications  Medication Sig Dispense Refill  . acetaminophen (TYLENOL) 500 MG tablet Take 1 tablet (500 mg total) by mouth every 6 (six) hours as needed (pain.). 30 tablet 3  . aspirin EC 81 MG EC tablet Take 1 tablet (81 mg total) by mouth daily.    . chlorpheniramine (CHLOR-TRIMETON) 4 MG tablet Take 4 mg by mouth daily.    . Cholecalciferol (VITAMIN D3) 50 MCG (2000 UT) TABS 2,000 Units.     Marland Kitchen glycopyrrolate (ROBINUL) 2 MG tablet Take 1 tablet (2 mg total) by mouth daily. 90 tablet 3  . hydrocortisone 2.5 % cream Apply 1 application topically daily as needed.    . lansoprazole (PREVACID) 30 MG capsule Take 1 capsule (30 mg total) by mouth daily before breakfast. 90 capsule 3  . lovastatin (MEVACOR) 20 MG tablet Take 1 tablet (20 mg total) by mouth daily. 90 tablet 3  . metoprolol tartrate (LOPRESSOR) 25 MG tablet Take 0.5 tablets (12.5 mg total) by mouth daily. 45 tablet 3  . amoxicillin-clavulanate (AUGMENTIN)  875-125 MG tablet Take 1 tablet by mouth 2 (two) times daily for 10 days. 20 tablet 0   No current facility-administered medications for this visit.    Allergies: Hydrocodone  Past Medical History:  Diagnosis Date  . Cancer [facility])    prostate.  skin basal cell  . Decreased hearing   . ED (erectile dysfunction)   . GERD (gastroesophageal reflux disease)   . Heart murmur   . Hyperlipidemia   . IBS (irritable bowel syndrome)   . Incidental pulmonary nodule, > 38mm and < 85mm 07/02/2018   Right lung - needs f/u CT 6 months  . MVP (mitral valve prolapse)   . Nonrheumatic mitral valve regurgitation   . Ruptured cervical disc 2008  . S/P minimally invasive mitral valve repair 07/08/2018   Complex valvuloplasty including triangular resection of posterior leaflet, artificial Gore-tex neochord placement x6 and 30 mm Sorin Memo 4D ring annuloplasty via right mini thoracotomy approach    Past Surgical History:  Procedure Laterality Date  . ACHILLES TENDON SURGERY  1980   prosthesis  . AMPUTATION FINGER / THUMB Right 12/2004   tip of thumb for strep infection  . EYE SURGERY    . MITRAL VALVE REPAIR Right 07/08/2018   Procedure: MINIMALLY INVASIVE MITRAL VALVE REPAIR (MVR);  Surgeon: [name], MD;  Location: [place];  Service: Open Heart Surgery;  Laterality: Right;  GLUTARALDEHYDE  . PROSTATECTOMY  2007  .  RIGHT/LEFT HEART CATH AND CORONARY ANGIOGRAPHY N/A 06/17/2018   Procedure: RIGHT/LEFT HEART CATH AND CORONARY ANGIOGRAPHY;  Surgeon: Sherren Mocha, MD;  Location: [place] [facility];  Service: Cardiovascular;  Laterality: N/A;  . TEE WITHOUT CARDIOVERSION N/A 04/06/2018   Procedure: TRANSESOPHAGEAL ECHOCARDIOGRAM (TEE);  Surgeon: Sanda Klein, MD;  Location: [place];  Service: Cardiovascular;  Laterality: N/A;  . TEE WITHOUT CARDIOVERSION N/A 07/08/2018   Procedure: TRANSESOPHAGEAL ECHOCARDIOGRAM (TEE);  Surgeon: [name], MD;  Location: [place];  Service: Open Heart Surgery;   Laterality: N/A;  . TONSILLECTOMY      Family History  Problem Relation Age of Onset  . Heart disease Mother        arrhythmia, angina  . Heart disease Father 80       CAD, MVR  . Hyperlipidemia Other     Social History   Tobacco Use  . Smoking status: Never Smoker  . Smokeless tobacco: Never Used  Substance Use Topics  . Alcohol use: No    Comment: occasionally    Subjective:   I connected with Shivam C Tolan on 75/30/05 at  9:20 AM EST by a video enabled telemedicine application and verified that I am speaking with the correct person using two identifiers.   I discussed the limitations of evaluation and management by telemedicine and the availability of in person appointments. The patient expressed understanding and agreed to proceed. Provider in office/ patient is at home; provider and patient are only 2 people on video call.   Concerned for sinus infection; symptoms x 1 week; + facial pressure; + PND; no fever, shortness of breath; fully vaccinated against COVID- no known exposure; is prone to sinus infections at least once per year;      Objective:  There were no vitals filed for this visit.  General: Well developed, well nourished, in no acute distress  Head: Normocephalic and atraumatic  Lungs: Respirations unlabored;  Neurologic: Alert and oriented; speech intact; face symmetrical;   Assessment:  1. Acute sinusitis, recurrence not specified, unspecified location     Plan:  Rx for Augmentin 875 mg bid x 10 days; continue OTC medications for symptom relief; increase fluids, rest and follow-up worse, no better; to consider COVID testing in 48 hours if symptoms persist.   No follow-ups on file.  No orders of the defined types were placed in this encounter.   Requested Prescriptions   Signed Prescriptions Disp Refills  . amoxicillin-clavulanate (AUGMENTIN) 875-125 MG tablet 20 tablet 0    Sig: Take 1 tablet by mouth 2 (two) times daily for 10 days.

## 2020-01-27 ENCOUNTER — Ambulatory Visit
Admission: RE | Admit: 2020-01-27 | Discharge: 2020-01-27 | Disposition: A | Discharge status: Home / Self Care | Payer: Medicare Other | Source: Ambulatory Visit | Attending: Cardiology | Admitting: Cardiology

## 2020-01-27 DIAGNOSIS — R918 Other nonspecific abnormal finding of lung field: Secondary | ICD-10-CM | POA: Diagnosis not present

## 2020-02-03 ENCOUNTER — Encounter: Payer: Self-pay | Admitting: Family

## 2020-02-05 HISTORY — PX: EYE SURGERY: SHX253

## 2020-02-06 ENCOUNTER — Other Ambulatory Visit: Payer: Self-pay | Admitting: Family

## 2020-02-06 MED ORDER — NYSTATIN 100000 UNIT/ML MT SUSP: 5.0000 mL | Freq: Four times a day (QID) | OROMUCOSAL | 0 refills | Status: DC

## 2020-02-16 DIAGNOSIS — H25011 Cortical age-related cataract, right eye: Secondary | ICD-10-CM | POA: Diagnosis not present

## 2020-02-16 DIAGNOSIS — Z961 Presence of intraocular lens: Secondary | ICD-10-CM | POA: Diagnosis not present

## 2020-02-16 DIAGNOSIS — Z9841 Cataract extraction status, right eye: Secondary | ICD-10-CM | POA: Diagnosis not present

## 2020-02-16 DIAGNOSIS — H2511 Age-related nuclear cataract, right eye: Secondary | ICD-10-CM | POA: Diagnosis not present

## 2020-02-16 DIAGNOSIS — Z48810 Encounter for surgical aftercare following surgery on the sense organs: Secondary | ICD-10-CM | POA: Diagnosis not present

## 2020-02-16 DIAGNOSIS — H25812 Combined forms of age-related cataract, left eye: Secondary | ICD-10-CM | POA: Diagnosis not present

## 2020-02-23 DIAGNOSIS — H25012 Cortical age-related cataract, left eye: Secondary | ICD-10-CM | POA: Diagnosis not present

## 2020-02-23 DIAGNOSIS — H2512 Age-related nuclear cataract, left eye: Secondary | ICD-10-CM | POA: Diagnosis not present

## 2020-03-22 DIAGNOSIS — Z85828 Personal history of other malignant neoplasm of skin: Secondary | ICD-10-CM | POA: Diagnosis not present

## 2020-03-22 DIAGNOSIS — L812 Freckles: Secondary | ICD-10-CM | POA: Diagnosis not present

## 2020-03-22 DIAGNOSIS — D485 Neoplasm of uncertain behavior of skin: Secondary | ICD-10-CM | POA: Diagnosis not present

## 2020-03-22 DIAGNOSIS — L7211 Pilar cyst: Secondary | ICD-10-CM | POA: Diagnosis not present

## 2020-03-22 DIAGNOSIS — C44229 Squamous cell carcinoma of skin of left ear and external auricular canal: Secondary | ICD-10-CM | POA: Diagnosis not present

## 2020-03-22 DIAGNOSIS — D1801 Hemangioma of skin and subcutaneous tissue: Secondary | ICD-10-CM | POA: Diagnosis not present

## 2020-03-22 DIAGNOSIS — L570 Actinic keratosis: Secondary | ICD-10-CM | POA: Diagnosis not present

## 2020-03-22 DIAGNOSIS — D225 Melanocytic nevi of trunk: Secondary | ICD-10-CM | POA: Diagnosis not present

## 2020-03-22 DIAGNOSIS — L821 Other seborrheic keratosis: Secondary | ICD-10-CM | POA: Diagnosis not present

## 2020-03-22 DIAGNOSIS — L820 Inflamed seborrheic keratosis: Secondary | ICD-10-CM | POA: Diagnosis not present

## 2020-06-13 ENCOUNTER — Telehealth (INDEPENDENT_AMBULATORY_CARE_PROVIDER_SITE_OTHER): Payer: Medicare Other | Admitting: Internal Medicine

## 2020-06-13 ENCOUNTER — Encounter: Payer: Self-pay | Admitting: Internal Medicine

## 2020-06-13 DIAGNOSIS — J069 Acute upper respiratory infection, unspecified: Secondary | ICD-10-CM | POA: Diagnosis not present

## 2020-06-13 MED ORDER — MONTELUKAST SODIUM 10 MG PO TABS: 10.0000 mg | ORAL_TABLET | Freq: Every day | ORAL | 0 refills | Status: DC

## 2020-06-13 NOTE — Assessment & Plan Note (Signed)
Offered testing for covid-19 which he declines. He is unvaccinated and at higher than average risk for hospitalization or death from covid-19. Rx singulair to help with sinus congestion and advised to take zyrtec otc also. Tylenol for fever and headache. Explained 5 day treatment window for covid-19 so he understands if he changes his mind that is the window for potential treatment if covid-19 positive.

## 2020-06-13 NOTE — Progress Notes (Signed)
Virtual Visit via Video Note  I connected with Brendan [name] on 86/75/44 at  8:40 AM EDT by a video enabled telemedicine application and verified that I am speaking with the correct person using two identifiers.  The patient and the provider were at separate locations throughout the entire encounter. Patient location: home, Provider location: work   I discussed the limitations of evaluation and management by telemedicine and the availability of in person appointments. The patient expressed understanding and agreed to proceed. The patient and the provider were the only parties present for the visit unless noted in HPI below.  History of Present Illness: The patient is a 70 y.o. man with visit for sinus pressure and pain. Started Sunday afternoon. Has mild improvement in headache yesterday. Had fever to 100.7 F this morning. Denies sick contacts. No covid-19 vaccine. Has not taken any home covid-19 tests. Does not have any desire for testing for covid-19. Overall it is stable. Has tried tylenol for headaches which was somewhat effective. He is concerned about sinus infection as he has had those in the past.   Observations/Objective: Appearance: normal, breathing appears normal, casual grooming, abdomen does not appear distended, throat not well visualized, mental status is A and O times 3  Assessment and Plan: See problem oriented charting  Follow Up Instructions: rx singulair and advised to take zyrtec otc, continue tylenol for fever or headache, recommended covid-19 testing as he would qualify for anti-viral treatment if covid-19 positive to help prevent hospitalization or death, notified about 5 day treatment window from onset of symptoms if he changes his mind and desired testing for covid-19.  I discussed the assessment and treatment plan with the patient. The patient was provided an opportunity to ask questions and all were answered. The patient agreed with the plan and demonstrated an  understanding of the instructions.   The patient was advised to call back or seek an in-person evaluation if the symptoms worsen or if the condition fails to improve as anticipated.  Hoyt Koch, MD

## 2020-06-19 ENCOUNTER — Other Ambulatory Visit: Payer: Self-pay | Admitting: *Deleted

## 2020-06-19 DIAGNOSIS — R911 Solitary pulmonary nodule: Secondary | ICD-10-CM

## 2020-06-19 DIAGNOSIS — Z9889 Other specified postprocedural states: Secondary | ICD-10-CM

## 2020-06-22 ENCOUNTER — Other Ambulatory Visit: Payer: Self-pay

## 2020-06-22 ENCOUNTER — Telehealth (INDEPENDENT_AMBULATORY_CARE_PROVIDER_SITE_OTHER): Payer: Medicare Other | Admitting: Internal Medicine

## 2020-06-22 DIAGNOSIS — J309 Allergic rhinitis, unspecified: Secondary | ICD-10-CM

## 2020-06-22 DIAGNOSIS — K219 Gastro-esophageal reflux disease without esophagitis: Secondary | ICD-10-CM

## 2020-06-22 DIAGNOSIS — J329 Chronic sinusitis, unspecified: Secondary | ICD-10-CM | POA: Diagnosis not present

## 2020-06-22 DIAGNOSIS — Z961 Presence of intraocular lens: Secondary | ICD-10-CM | POA: Diagnosis not present

## 2020-06-22 MED ORDER — LEVOFLOXACIN 500 MG PO TABS: 500.0000 mg | ORAL_TABLET | Freq: Every day | ORAL | 0 refills | Status: AC

## 2020-06-22 NOTE — Progress Notes (Signed)
Patient ID: Brendan [name], male   DOB: 98/02/1914, 70 y.o.   MRN: [id number]  Virtual Visit via Video Note  I connected with Elyse Jarvis Morissette on 08/65/78 at  3:20 PM EDT by a video enabled telemedicine application and verified that I am speaking with the correct person using two identifiers.  Location of all participants today Patient: at home Provider: at office   I discussed the limitations of evaluation and management by telemedicine and the availability of in person appointments. The patient expressed understanding and agreed to proceed.  History of Present Illness:  Here with 2-3 days acute onset fever, facial pain, pressure, headache, general weakness and malaise, and greenish d/c, with mild ST and cough, but pt denies chest pain, wheezing, increased sob or doe, orthopnea, PND, increased LE swelling, palpitations, dizziness or syncope.  Seems to happen every year about this time.  Does have several wks ongoing nasal allergy symptoms with clearish congestion, itch and sneezing, without fever, pain, ST, cough, swelling or wheezing.  Denies worsening reflux, abd pain, dysphagia, n/v, bowel change or blood. Past Medical History:  Diagnosis Date  . Cancer [facility])    prostate.  skin basal cell  . Decreased hearing   . ED (erectile dysfunction)   . GERD (gastroesophageal reflux disease)   . Heart murmur   . Hyperlipidemia   . IBS (irritable bowel syndrome)   . Incidental pulmonary nodule, > 66mm and < 52mm 07/02/2018   Right lung - needs f/u CT 6 months  . MVP (mitral valve prolapse)   . Nonrheumatic mitral valve regurgitation   . Ruptured cervical disc 2008  . S/P minimally invasive mitral valve repair 07/08/2018   Complex valvuloplasty including triangular resection of posterior leaflet, artificial Gore-tex neochord placement x6 and 30 mm Sorin Memo 4D ring annuloplasty via right mini thoracotomy approach   Past Surgical History:  Procedure Laterality Date  . ACHILLES TENDON SURGERY   1980   prosthesis  . AMPUTATION FINGER / THUMB Right 12/2004   tip of thumb for strep infection  . EYE SURGERY    . MITRAL VALVE REPAIR Right 07/08/2018   Procedure: MINIMALLY INVASIVE MITRAL VALVE REPAIR (MVR);  Surgeon: [name], MD;  Location: [place];  Service: Open Heart Surgery;  Laterality: Right;  GLUTARALDEHYDE  . PROSTATECTOMY  2007  . RIGHT/LEFT HEART CATH AND CORONARY ANGIOGRAPHY N/A 06/17/2018   Procedure: RIGHT/LEFT HEART CATH AND CORONARY ANGIOGRAPHY;  Surgeon: Sherren Mocha, MD;  Location: [place] [facility];  Service: Cardiovascular;  Laterality: N/A;  . TEE WITHOUT CARDIOVERSION N/A 04/06/2018   Procedure: TRANSESOPHAGEAL ECHOCARDIOGRAM (TEE);  Surgeon: Sanda Klein, MD;  Location: [place];  Service: Cardiovascular;  Laterality: N/A;  . TEE WITHOUT CARDIOVERSION N/A 07/08/2018   Procedure: TRANSESOPHAGEAL ECHOCARDIOGRAM (TEE);  Surgeon: [name], MD;  Location: [place];  Service: Open Heart Surgery;  Laterality: N/A;  . TONSILLECTOMY      reports that he has never smoked. He has never used smokeless tobacco. He reports that he does not drink alcohol and does not use drugs. family history includes Heart disease in his mother; Heart disease (age of onset: 12) in his father; Hyperlipidemia in an other family member. Allergies  Allergen Reactions  . Hydrocodone Other (See Comments)    chills   Current Outpatient Medications on File Prior to Visit  Medication Sig Dispense Refill  . acetaminophen (TYLENOL) 500 MG tablet Take 1 tablet (500 mg total) by mouth every 6 (six) hours as needed (  pain.). 30 tablet 3  . aspirin EC 81 MG EC tablet Take 1 tablet (81 mg total) by mouth daily.    . chlorpheniramine (CHLOR-TRIMETON) 4 MG tablet Take 4 mg by mouth daily.    . Cholecalciferol (VITAMIN D3) 50 MCG (2000 UT) TABS 2,000 Units.     Marland Kitchen glycopyrrolate (ROBINUL) 2 MG tablet Take 1 tablet (2 mg total) by mouth daily. 90 tablet 3  . hydrocortisone 2.5 % cream Apply 1  application topically daily as needed.    . lansoprazole (PREVACID) 30 MG capsule Take 1 capsule (30 mg total) by mouth daily before breakfast. 90 capsule 3  . lovastatin (MEVACOR) 20 MG tablet Take 1 tablet (20 mg total) by mouth daily. 90 tablet 3  . metoprolol tartrate (LOPRESSOR) 25 MG tablet Take 0.5 tablets (12.5 mg total) by mouth daily. 45 tablet 3  . nystatin (MYCOSTATIN) 100000 UNIT/ML suspension Take 5 mLs (500,000 Units total) by mouth 4 (four) times daily. Gargle and spit 150 mL 0   No current facility-administered medications on file prior to visit.   Observations/Objective: Alert, NAD, appropriate mood and affect, resps normal, cn 2-12 intact, moves all 4s, no visible rash or swelling Lab Results  Component Value Date   WBC 6.2 09/24/2018   HGB 14.1 09/24/2018   HCT 42.7 09/24/2018   PLT 205.0 09/24/2018   GLUCOSE 89 07/28/2019   CHOL 184 07/28/2019   TRIG 168.0 (H) 07/28/2019   HDL 45.80 07/28/2019   LDLCALC 104 (H) 07/28/2019   ALT 10 01/25/2019   AST 15 01/25/2019   NA 140 07/28/2019   K 3.9 07/28/2019   CL 104 07/28/2019   CREATININE 1.03 07/28/2019   BUN 14 07/28/2019   CO2 31 07/28/2019   TSH 1.02 07/22/2017   PSA 0.00 (L) 07/28/2019   INR 2.1 10/05/2018   HGBA1C 5.1 07/03/2018   Assessment and Plan: See notes  Follow Up Instructions: See notes   I discussed the assessment and treatment plan with the patient. The patient was provided an opportunity to ask questions and all were answered. The patient agreed with the plan and demonstrated an understanding of the instructions.   The patient was advised to call back or seek an in-person evaluation if the symptoms worsen or if the condition fails to improve as anticipated.   Cathlean Cower, MD

## 2020-06-25 ENCOUNTER — Encounter: Payer: Self-pay | Admitting: Internal Medicine

## 2020-06-25 DIAGNOSIS — J329 Chronic sinusitis, unspecified: Secondary | ICD-10-CM | POA: Insufficient documentation

## 2020-06-25 NOTE — Assessment & Plan Note (Signed)
Mild to mod, for antibx course,  to f/u any worsening symptoms or concerns 

## 2020-06-25 NOTE — Assessment & Plan Note (Signed)
Pt declines singulair refill, ok for otc allegra prn,   to f/u any worsening symptoms or concerns

## 2020-06-25 NOTE — Assessment & Plan Note (Signed)
Stable, continue PPI

## 2020-06-25 NOTE — Patient Instructions (Signed)
Please take all new medication as prescribed 

## 2020-07-31 ENCOUNTER — Encounter: Payer: Medicare Other | Admitting: Thoracic Surgery (Cardiothoracic Vascular Surgery)

## 2020-09-04 ENCOUNTER — Other Ambulatory Visit: Payer: Self-pay | Admitting: Internal Medicine

## 2020-09-14 ENCOUNTER — Ambulatory Visit (INDEPENDENT_AMBULATORY_CARE_PROVIDER_SITE_OTHER): Payer: Medicare Other | Admitting: Internal Medicine

## 2020-09-14 ENCOUNTER — Other Ambulatory Visit: Payer: Self-pay

## 2020-09-14 ENCOUNTER — Encounter: Payer: Self-pay | Admitting: Internal Medicine

## 2020-09-14 VITALS — BP 121/82 | HR 71 | Temp 97.8°F | Ht 69.0 in | Wt 179.6 lb

## 2020-09-14 DIAGNOSIS — Z0000 Encounter for general adult medical examination without abnormal findings: Secondary | ICD-10-CM | POA: Diagnosis not present

## 2020-09-14 DIAGNOSIS — E785 Hyperlipidemia, unspecified: Secondary | ICD-10-CM

## 2020-09-14 DIAGNOSIS — I700 Atherosclerosis of aorta: Secondary | ICD-10-CM | POA: Diagnosis not present

## 2020-09-14 DIAGNOSIS — Z8546 Personal history of malignant neoplasm of prostate: Secondary | ICD-10-CM

## 2020-09-14 LAB — CBC WITH DIFFERENTIAL/PLATELET
Basophils Absolute: 0 10*3/uL (ref 0.0–0.1)
Basophils Relative: 0.8 % (ref 0.0–3.0)
Eosinophils Absolute: 0.3 10*3/uL (ref 0.0–0.7)
Eosinophils Relative: 5.7 % — ABNORMAL HIGH (ref 0.0–5.0)
HCT: 43.8 % (ref 39.0–52.0)
Hemoglobin: 14.7 g/dL (ref 13.0–17.0)
Lymphocytes Relative: 20.9 % (ref 12.0–46.0)
Lymphs Abs: 1.2 10*3/uL (ref 0.7–4.0)
MCHC: 33.5 g/dL (ref 30.0–36.0)
MCV: 88.3 fl (ref 78.0–100.0)
Monocytes Absolute: 0.5 10*3/uL (ref 0.1–1.0)
Monocytes Relative: 7.6 % (ref 3.0–12.0)
Neutro Abs: 3.8 10*3/uL (ref 1.4–7.7)
Neutrophils Relative %: 65 % (ref 43.0–77.0)
Platelets: 180 10*3/uL (ref 150.0–400.0)
RBC: 4.96 Mil/uL (ref 4.22–5.81)
RDW: 13.5 % (ref 11.5–15.5)
WBC: 5.9 10*3/uL (ref 4.0–10.5)

## 2020-09-14 LAB — LIPID PANEL
Cholesterol: 189 mg/dL (ref 0–200)
HDL: 49.4 mg/dL (ref >39.00)
LDL Cholesterol: 112 mg/dL — ABNORMAL HIGH (ref 0–99)
NonHDL: 139.64
Total CHOL/HDL Ratio: 4
Triglycerides: 137 mg/dL (ref 0.0–149.0)
VLDL: 27.4 mg/dL (ref 0.0–40.0)

## 2020-09-14 LAB — COMPREHENSIVE METABOLIC PANEL
ALT: 19 U/L (ref 0–53)
AST: 22 U/L (ref 0–37)
Albumin: 4.4 g/dL (ref 3.5–5.2)
Alkaline Phosphatase: 58 U/L (ref 39–117)
BUN: 12 mg/dL (ref 6–23)
CO2: 29 mEq/L (ref 19–32)
Calcium: 9.1 mg/dL (ref 8.4–10.5)
Chloride: 106 mEq/L (ref 96–112)
Creatinine, Ser: 1.04 mg/dL (ref 0.40–1.50)
GFR: 72.11 mL/min (ref >60.00)
Glucose, Bld: 94 mg/dL (ref 70–99)
Potassium: 4.2 mEq/L (ref 3.5–5.1)
Sodium: 141 mEq/L (ref 135–145)
Total Bilirubin: 0.7 mg/dL (ref 0.2–1.2)
Total Protein: 6.6 g/dL (ref 6.0–8.3)

## 2020-09-14 LAB — URINALYSIS
Bilirubin Urine: NEGATIVE
Hgb urine dipstick: NEGATIVE
Ketones, ur: NEGATIVE
Leukocytes,Ua: NEGATIVE
Nitrite: NEGATIVE
Specific Gravity, Urine: 1.015 (ref 1.000–1.030)
Total Protein, Urine: NEGATIVE
Urine Glucose: NEGATIVE
Urobilinogen, UA: 1 (ref 0.0–1.0)
pH: 7.5 (ref 5.0–8.0)

## 2020-09-14 LAB — PSA: PSA: 0 ng/mL — ABNORMAL LOW (ref 0.10–4.00)

## 2020-09-14 LAB — TSH: TSH: 1.01 u[IU]/mL (ref 0.35–5.50)

## 2020-09-14 MED ORDER — LANSOPRAZOLE 30 MG PO CPDR: 30.0000 mg | DELAYED_RELEASE_CAPSULE | Freq: Every day | ORAL | 3 refills | Status: DC

## 2020-09-14 MED ORDER — METOPROLOL TARTRATE 25 MG PO TABS: 12.5000 mg | ORAL_TABLET | Freq: Every day | ORAL | 3 refills | Status: DC

## 2020-09-14 MED ORDER — LOVASTATIN 20 MG PO TABS: 20.0000 mg | ORAL_TABLET | Freq: Every day | ORAL | 3 refills | Status: DC

## 2020-09-14 MED ORDER — GLYCOPYRROLATE 2 MG PO TABS: 2.0000 mg | ORAL_TABLET | Freq: Every day | ORAL | 3 refills | Status: DC

## 2020-09-14 NOTE — Patient Instructions (Signed)
Shingrix, Prevnar 20 - suggested

## 2020-09-14 NOTE — Addendum Note (Signed)
Addended by: Boris Lown B on: 09/14/2020 09:24 AM   Modules accepted: Orders

## 2020-09-14 NOTE — Assessment & Plan Note (Signed)
On Lovastatin

## 2020-09-14 NOTE — Assessment & Plan Note (Signed)
  We discussed age appropriate health related issues, including available/recomended screening tests and vaccinations. Labs were ordered to be later reviewed . All questions were answered. We discussed one or more of the following - seat belt use, use of sunscreen/sun exposure exercise, fall risk reduction, second hand smoke exposure, firearm use and storage, seat belt use, a need for adhering to healthy diet and exercise. Labs were ordered.  All questions were answered. Cologuard (-) 2015 2019. Cologuard due in April 2023 Shingrix, Prevnar 20 - suggested

## 2020-09-14 NOTE — Progress Notes (Signed)
Subjective:  Patient ID: Brendan [name], male    DOB: [birth year]  Age: 70 y.o. MRN: [id number]  CC: Annual Exam   HPI Brendan [name] presents for a well exam  Outpatient Medications Prior to Visit  Medication Sig Dispense Refill   acetaminophen (TYLENOL) 500 MG tablet Take 1 tablet (500 mg total) by mouth every 6 (six) hours as needed (pain.). 30 tablet 3   aspirin EC 81 MG EC tablet Take 1 tablet (81 mg total) by mouth daily.     chlorpheniramine (CHLOR-TRIMETON) 4 MG tablet Take 4 mg by mouth daily.     Cholecalciferol (VITAMIN D3) 50 MCG (2000 UT) TABS 2,000 Units.      hydrocortisone 2.5 % cream Apply 1 application topically daily as needed.     glycopyrrolate (ROBINUL) 2 MG tablet Take 1 tablet (2 mg total) by mouth daily. Overdue for Annual appt w/labs must see provider for future refills 30 tablet 0   lansoprazole (PREVACID) 30 MG capsule Take 1 capsule (30 mg total) by mouth daily before breakfast. Overdue for Annual appt w/labs must see provider for future refills 30 capsule 0   lovastatin (MEVACOR) 20 MG tablet Take 1 tablet (20 mg total) by mouth daily. Overdue for Annual appt w/labs must see provider for future refills 30 tablet 0   metoprolol tartrate (LOPRESSOR) 25 MG tablet Take 0.5 tablets (12.5 mg total) by mouth daily. Overdue for Annual appt w/labs must see provider for future refills 15 tablet 0   nystatin (MYCOSTATIN) 100000 UNIT/ML suspension Take 5 mLs (500,000 Units total) by mouth 4 (four) times daily. Gargle and spit 150 mL 0   No facility-administered medications prior to visit.    ROS: Review of Systems  Constitutional:  Negative for appetite change, fatigue and unexpected weight change.  HENT:  Negative for congestion, nosebleeds, sneezing, sore throat and trouble swallowing.   Eyes:  Negative for itching and visual disturbance.  Respiratory:  Negative for cough.   Cardiovascular:  Negative for chest pain, palpitations and leg swelling.   Gastrointestinal:  Negative for abdominal distention, blood in stool, diarrhea and nausea.  Genitourinary:  Negative for frequency and hematuria.  Musculoskeletal:  Negative for back pain, gait problem, joint swelling and neck pain.  Skin:  Negative for rash.  Neurological:  Negative for dizziness, tremors, speech difficulty and weakness.  Psychiatric/Behavioral:  Negative for agitation, dysphoric mood and sleep disturbance. The patient is not nervous/anxious.    Objective:  BP 121/82 (BP Location: Left Arm)   Pulse 71   Temp 97.8 F (36.6 C) (Oral)   Ht '5\' 9"'$  (1.753 m)   Wt 179 lb 9.6 oz (81.5 kg)   SpO2 95%   BMI 26.52 kg/m   BP Readings from Last 3 Encounters:  09/14/20 121/82  01/10/20 138/76  07/28/19 (!) 142/96    Wt Readings from Last 3 Encounters:  09/14/20 179 lb 9.6 oz (81.5 kg)  01/10/20 180 lb (81.6 kg)  07/28/19 173 lb (78.5 kg)    Physical Exam Constitutional:      General: He is not in acute distress.    Appearance: He is well-developed.     Comments: NAD  Eyes:     Conjunctiva/sclera: Conjunctivae normal.     Pupils: Pupils are equal, round, and reactive to light.  Neck:     Thyroid: No thyromegaly.     Vascular: No JVD.  Cardiovascular:     Rate and Rhythm: Normal rate and regular rhythm.  Heart sounds: Normal heart sounds. No murmur heard.   No friction rub. No gallop.  Pulmonary:     Effort: Pulmonary effort is normal. No respiratory distress.     Breath sounds: Normal breath sounds. No wheezing or rales.  Chest:     Chest wall: No tenderness.  Abdominal:     General: Bowel sounds are normal. There is no distension.     Palpations: Abdomen is soft. There is no mass.     Tenderness: There is no abdominal tenderness. There is no guarding or rebound.  Musculoskeletal:        General: No tenderness. Normal range of motion.     Cervical back: Normal range of motion.  Lymphadenopathy:     Cervical: No cervical adenopathy.  Skin:     General: Skin is warm and dry.     Findings: No rash.  Neurological:     Mental Status: He is alert and oriented to person, place, and time.     Cranial Nerves: No cranial nerve deficit.     Motor: No abnormal muscle tone.     Coordination: Coordination normal.     Gait: Gait normal.     Deep Tendon Reflexes: Reflexes are normal and symmetric.  Psychiatric:        Behavior: Behavior normal.        Thought Content: Thought content normal.        Judgment: Judgment normal.    Lab Results  Component Value Date   WBC 6.2 09/24/2018   HGB 14.1 09/24/2018   HCT 42.7 09/24/2018   PLT 205.0 09/24/2018   GLUCOSE 89 07/28/2019   CHOL 184 07/28/2019   TRIG 168.0 (H) 07/28/2019   HDL 45.80 07/28/2019   LDLCALC 104 (H) 07/28/2019   ALT 10 01/25/2019   AST 15 01/25/2019   NA 140 07/28/2019   K 3.9 07/28/2019   CL 104 07/28/2019   CREATININE 1.03 07/28/2019   BUN 14 07/28/2019   CO2 31 07/28/2019   TSH 1.02 07/22/2017   PSA 0.00 (L) 07/28/2019   INR 2.1 10/05/2018   HGBA1C 5.1 07/03/2018    CT Chest Wo Contrast  Result Date: 01/27/2020 CLINICAL DATA:  Follow-up lung nodule. EXAM: CT CHEST WITHOUT CONTRAST TECHNIQUE: Multidetector CT imaging of the chest was performed following the standard protocol without IV contrast. COMPARISON:  01/18/2019 FINDINGS: Cardiovascular: Normal heart size. No pericardial effusion. Aortic atherosclerosis. Mitral valve repair. Coronary artery calcifications. Mediastinum/Nodes: No enlarged mediastinal or axillary lymph nodes. Thyroid gland, trachea, and esophagus demonstrate no significant findings. Lungs/Pleura: No pleural effusion, airspace consolidation, or atelectasis. Index nodule within the anterior right lower lobe measures 0.6 cm, image 96/5. Unchanged. Subpleural nodule in the right lower lobe measures 5 mm, image 71/5. Also unchanged. Pleural calcification along the right lung base. No new or suspicious lung nodules. Upper Abdomen: No acute  abnormality. Right lobe of liver cyst is unchanged measuring 2.4 x 1.9 cm. Musculoskeletal: No acute or suspicious osseous findings. Mild multilevel thoracic degenerative disc disease. IMPRESSION: 1. Stable small pulmonary nodules within the right lower lobe. Imaging findings are consistent with a benign process require no further follow-up. 2. Coronary artery calcifications. 3. Aortic atherosclerosis. Aortic Atherosclerosis (ICD10-I70.0). Electronically Signed   By: Kerby Moors M.D.   On: 01/27/2020 11:27    Assessment & Plan:   There are no diagnoses linked to this encounter.   Meds ordered this encounter  Medications   glycopyrrolate (ROBINUL) 2 MG tablet    Sig:  Take 1 tablet (2 mg total) by mouth daily.    Dispense:  90 tablet    Refill:  3   lansoprazole (PREVACID) 30 MG capsule    Sig: Take 1 capsule (30 mg total) by mouth daily before breakfast.    Dispense:  90 capsule    Refill:  3   lovastatin (MEVACOR) 20 MG tablet    Sig: Take 1 tablet (20 mg total) by mouth daily.    Dispense:  90 tablet    Refill:  3   metoprolol tartrate (LOPRESSOR) 25 MG tablet    Sig: Take 0.5 tablets (12.5 mg total) by mouth daily.    Dispense:  45 tablet    Refill:  3     Follow-up: No follow-ups on file.  Walker Kehr, MD

## 2020-09-24 DIAGNOSIS — Z1211 Encounter for screening for malignant neoplasm of colon: Secondary | ICD-10-CM

## 2020-09-29 ENCOUNTER — Encounter: Payer: Self-pay | Admitting: Internal Medicine

## 2020-09-29 DIAGNOSIS — Z1211 Encounter for screening for malignant neoplasm of colon: Secondary | ICD-10-CM | POA: Diagnosis not present

## 2020-10-02 DIAGNOSIS — L723 Sebaceous cyst: Secondary | ICD-10-CM | POA: Diagnosis not present

## 2020-10-02 DIAGNOSIS — Z85828 Personal history of other malignant neoplasm of skin: Secondary | ICD-10-CM | POA: Diagnosis not present

## 2020-10-02 DIAGNOSIS — L821 Other seborrheic keratosis: Secondary | ICD-10-CM | POA: Diagnosis not present

## 2020-10-02 DIAGNOSIS — L812 Freckles: Secondary | ICD-10-CM | POA: Diagnosis not present

## 2020-10-02 DIAGNOSIS — D1801 Hemangioma of skin and subcutaneous tissue: Secondary | ICD-10-CM | POA: Diagnosis not present

## 2020-10-02 DIAGNOSIS — L570 Actinic keratosis: Secondary | ICD-10-CM | POA: Diagnosis not present

## 2020-10-05 LAB — COLOGUARD
Cologuard: NEGATIVE
Cologuard: NEGATIVE

## 2020-10-06 ENCOUNTER — Other Ambulatory Visit: Payer: Self-pay

## 2020-10-06 ENCOUNTER — Ambulatory Visit (HOSPITAL_COMMUNITY): Payer: Medicare Other | Attending: Cardiology

## 2020-10-06 DIAGNOSIS — Z954 Presence of other heart-valve replacement: Secondary | ICD-10-CM

## 2020-10-06 DIAGNOSIS — Z9889 Other specified postprocedural states: Secondary | ICD-10-CM | POA: Diagnosis not present

## 2020-10-06 DIAGNOSIS — I340 Nonrheumatic mitral (valve) insufficiency: Secondary | ICD-10-CM

## 2020-10-06 LAB — ECHOCARDIOGRAM COMPLETE
Area-P 1/2: 3.6 cm2
MV M vel: 6.37 m/s
MV Peak grad: 162.3 mmHg
MV VTI: 2.2 cm2
S' Lateral: 2.9 cm

## 2020-10-20 ENCOUNTER — Encounter: Payer: Self-pay | Admitting: Internal Medicine

## 2020-10-20 NOTE — Progress Notes (Signed)
Rec'd Cologuard report already been abstracted.Marland KitchenJohny [name]

## 2021-01-09 ENCOUNTER — Encounter: Payer: Self-pay | Admitting: Internal Medicine

## 2021-01-09 DIAGNOSIS — L7211 Pilar cyst: Secondary | ICD-10-CM | POA: Diagnosis not present

## 2021-01-09 DIAGNOSIS — C44212 Basal cell carcinoma of skin of right ear and external auricular canal: Secondary | ICD-10-CM | POA: Diagnosis not present

## 2021-01-09 DIAGNOSIS — Z85828 Personal history of other malignant neoplasm of skin: Secondary | ICD-10-CM | POA: Diagnosis not present

## 2021-01-09 DIAGNOSIS — C4441 Basal cell carcinoma of skin of scalp and neck: Secondary | ICD-10-CM | POA: Diagnosis not present

## 2021-01-09 DIAGNOSIS — D485 Neoplasm of uncertain behavior of skin: Secondary | ICD-10-CM | POA: Diagnosis not present

## 2021-01-14 NOTE — Progress Notes (Signed)
Cardiology Office Note   Date:  01/15/2021   ID:  Brendan [name], DOB [birth year], MRN [id number]  PCP:  Cassandria Anger, MD  Cardiologist:   Minus Breeding, MD Referring:  Cassandria Anger, MD    Chief Complaint  Patient presents with   MVR      History of Present Illness: Brendan [name] is a 70 y.o. male who is referred by Brendan, [name], MD male with who presents for follow up after minimally invasive MVR.   This was stable on echo in Sept 2022.    Since I last saw him he has done very well.  He is not walking the 2 to 3 miles a day he was doing.  He does still do some yard work. The patient denies any new symptoms such as chest discomfort, neck or arm discomfort. There has been no new shortness of breath, PND or orthopnea. There have been no reported palpitations, presyncope or syncope.    Past Medical History:  Diagnosis Date   Cancer [facility])    prostate.  skin basal cell   Decreased hearing    ED (erectile dysfunction)    GERD (gastroesophageal reflux disease)    Heart murmur    Hyperlipidemia    IBS (irritable bowel syndrome)    Incidental pulmonary nodule, > 67mm and < 56mm 07/02/2018   Right lung - needs f/u CT 6 months   MVP (mitral valve prolapse)    Nonrheumatic mitral valve regurgitation    Ruptured cervical disc 2008   S/P minimally invasive mitral valve repair 07/08/2018   Complex valvuloplasty including triangular resection of posterior leaflet, artificial Gore-tex neochord placement x6 and 30 mm Sorin Memo 4D ring annuloplasty via right mini thoracotomy approach    Past Surgical History:  Procedure Laterality Date   ACHILLES TENDON SURGERY  1980   prosthesis   AMPUTATION FINGER / THUMB Right 12/2004   tip of thumb for strep infection   EYE SURGERY     MITRAL VALVE REPAIR Right 07/08/2018   Procedure: MINIMALLY INVASIVE MITRAL VALVE REPAIR (MVR);  Surgeon: [name], MD;  Location: [place];  Service: Open Heart Surgery;  Laterality:  Right;  GLUTARALDEHYDE   PROSTATECTOMY  2007   RIGHT/LEFT HEART CATH AND CORONARY ANGIOGRAPHY N/A 06/17/2018   Procedure: RIGHT/LEFT HEART CATH AND CORONARY ANGIOGRAPHY;  Surgeon: Sherren Mocha, MD;  Location: [place] [facility];  Service: Cardiovascular;  Laterality: N/A;   TEE WITHOUT CARDIOVERSION N/A 04/06/2018   Procedure: TRANSESOPHAGEAL ECHOCARDIOGRAM (TEE);  Surgeon: Sanda Klein, MD;  Location: [place];  Service: Cardiovascular;  Laterality: N/A;   TEE WITHOUT CARDIOVERSION N/A 07/08/2018   Procedure: TRANSESOPHAGEAL ECHOCARDIOGRAM (TEE);  Surgeon: [name], MD;  Location: [place];  Service: Open Heart Surgery;  Laterality: N/A;   TONSILLECTOMY       Current Outpatient Medications  Medication Sig Dispense Refill   acetaminophen (TYLENOL) 500 MG tablet Take 1 tablet (500 mg total) by mouth every 6 (six) hours as needed (pain.). 30 tablet 3   aspirin EC 81 MG EC tablet Take 1 tablet (81 mg total) by mouth daily.     chlorpheniramine (CHLOR-TRIMETON) 4 MG tablet Take 4 mg by mouth daily.     Cholecalciferol (VITAMIN D3) 50 MCG (2000 UT) TABS 2,000 Units.      glycopyrrolate (ROBINUL) 2 MG tablet Take 1 tablet (2 mg total) by mouth daily. 90 tablet 3   hydrocortisone 2.5 % cream Apply 1 application  topically daily as needed.     lansoprazole (PREVACID) 30 MG capsule Take 1 capsule (30 mg total) by mouth daily before breakfast. 90 capsule 3   lovastatin (MEVACOR) 20 MG tablet Take 1 tablet (20 mg total) by mouth daily. 90 tablet 3   metoprolol tartrate (LOPRESSOR) 25 MG tablet Take 0.5 tablets (12.5 mg total) by mouth daily. 45 tablet 3   No current facility-administered medications for this visit.    Allergies:   Hydrocodone    ROS:  Please see the history of present illness.   Otherwise, review of systems are positive for none.   All other systems are reviewed and negative.    PHYSICAL EXAM: VS:  BP 119/68   Pulse 72   Ht 5\' 5"  (1.651 m)   Wt 181 lb (82.1 kg)    SpO2 99%   BMI 30.12 kg/m  , BMI Body mass index is 30.12 kg/m. GENERAL:  Well appearing NECK:  No jugular venous distention, waveform within normal limits, carotid upstroke brisk and symmetric, no bruits, no thyromegaly LUNGS:  Clear to auscultation bilaterally CHEST:  Well healed sternotomy scar. HEART:  PMI not displaced or sustained,S1 and S2 within normal limits, no S3, no S4, no clicks, no rubs, no murmurs ABD:  Flat, positive bowel sounds normal in frequency in pitch, no bruits, no rebound, no guarding, no midline pulsatile mass, no hepatomegaly, no splenomegaly EXT:  2 plus pulses throughout, no edema, no cyanosis no clubbing   EKG:  EKG is  ordered today The ekg ordered today demonstrates sinus rhythm, rate 72, axis within normal limits, intervals within normal limits, no acute ST-T wave changes.   Recent Labs: 09/14/2020: ALT 19; BUN 12; Creatinine, Ser 1.04; Hemoglobin 14.7; Platelets 180.0; Potassium 4.2; Sodium 141; TSH 1.01    Lipid Panel    Component Value Date/Time   CHOL 189 09/14/2020 0924   TRIG 137.0 09/14/2020 0924   HDL 49.40 09/14/2020 0924   CHOLHDL 4 09/14/2020 0924   VLDL 27.4 09/14/2020 0924   LDLCALC 112 (H) 09/14/2020 0924      Wt Readings from Last 3 Encounters:  01/15/21 181 lb (82.1 kg)  09/14/20 179 lb 9.6 oz (81.5 kg)  01/10/20 180 lb (81.6 kg)       ASSESSMENT AND PLAN:   MV Repair:   This was stable on echo as above.  He understands endocarditis prophylaxis.  No change in therapy.  Dyslipidemia:  LDL is a little bit higher than it was at 112.  We talked about diet and getting back to his exercise.  For now he can remain on the meds as listed.   Current medicines are reviewed at length with the patient today.  The patient does not have concerns regarding medicines.  The following changes have been made: None  Labs/ tests ordered today include:   None  Orders Placed This Encounter  Procedures   EKG 12-Lead      Disposition:    FU with me in one year   Signed, Minus Breeding, MD  01/15/2021 1:01 PM    [facility]

## 2021-01-15 ENCOUNTER — Other Ambulatory Visit: Payer: Self-pay

## 2021-01-15 ENCOUNTER — Ambulatory Visit (INDEPENDENT_AMBULATORY_CARE_PROVIDER_SITE_OTHER): Payer: Medicare Other | Admitting: Cardiology

## 2021-01-15 ENCOUNTER — Encounter: Payer: Self-pay | Admitting: Cardiology

## 2021-01-15 VITALS — BP 119/68 | HR 72 | Ht 65.0 in | Wt 181.0 lb

## 2021-01-15 DIAGNOSIS — E785 Hyperlipidemia, unspecified: Secondary | ICD-10-CM

## 2021-01-15 DIAGNOSIS — Z9889 Other specified postprocedural states: Secondary | ICD-10-CM | POA: Diagnosis not present

## 2021-01-15 NOTE — Patient Instructions (Signed)
Medication Instructions:  °Your Physician recommend you continue on your current medication as directed.   ° °*If you need a refill on your cardiac medications before your next appointment, please call your pharmacy* ° °Follow-Up: °At [facility], you and your health needs are our priority.  As part of our continuing mission to provide you with exceptional heart care, we have created designated Provider Care Teams.  These Care Teams include your primary Cardiologist (physician) and Advanced Practice Providers (APPs -  Physician Assistants and Nurse Practitioners) who all work together to provide you with the care you need, when you need it. ° °We recommend signing up for the patient portal called "MyChart".  Sign up information is provided on this After Visit Summary.  MyChart is used to connect with patients for Virtual Visits (Telemedicine).  Patients are able to view lab/test results, encounter notes, upcoming appointments, etc.  Non-urgent messages can be sent to your provider as well.   °To learn more about what you can do with MyChart, go to [email or web address].   ° °Your next appointment:   °12 month(s) ° °The format for your next appointment:   °In Person ° °Provider:   °James Hochrein, MD  ° ° ° °

## 2021-02-19 DIAGNOSIS — Z85828 Personal history of other malignant neoplasm of skin: Secondary | ICD-10-CM | POA: Diagnosis not present

## 2021-02-19 DIAGNOSIS — C44212 Basal cell carcinoma of skin of right ear and external auricular canal: Secondary | ICD-10-CM | POA: Diagnosis not present

## 2021-04-10 DIAGNOSIS — L821 Other seborrheic keratosis: Secondary | ICD-10-CM | POA: Diagnosis not present

## 2021-04-10 DIAGNOSIS — L812 Freckles: Secondary | ICD-10-CM | POA: Diagnosis not present

## 2021-04-10 DIAGNOSIS — D1801 Hemangioma of skin and subcutaneous tissue: Secondary | ICD-10-CM | POA: Diagnosis not present

## 2021-04-10 DIAGNOSIS — D225 Melanocytic nevi of trunk: Secondary | ICD-10-CM | POA: Diagnosis not present

## 2021-04-10 DIAGNOSIS — L7211 Pilar cyst: Secondary | ICD-10-CM | POA: Diagnosis not present

## 2021-04-10 DIAGNOSIS — Z85828 Personal history of other malignant neoplasm of skin: Secondary | ICD-10-CM | POA: Diagnosis not present

## 2021-06-25 DIAGNOSIS — Z961 Presence of intraocular lens: Secondary | ICD-10-CM | POA: Diagnosis not present

## 2021-06-25 DIAGNOSIS — H524 Presbyopia: Secondary | ICD-10-CM | POA: Diagnosis not present

## 2021-09-17 ENCOUNTER — Encounter: Payer: Self-pay | Admitting: Internal Medicine

## 2021-09-17 ENCOUNTER — Ambulatory Visit (INDEPENDENT_AMBULATORY_CARE_PROVIDER_SITE_OTHER): Payer: Medicare Other | Admitting: Internal Medicine

## 2021-09-17 VITALS — BP 118/74 | HR 69 | Temp 98.9°F | Ht 65.0 in | Wt 175.2 lb

## 2021-09-17 DIAGNOSIS — E785 Hyperlipidemia, unspecified: Secondary | ICD-10-CM

## 2021-09-17 DIAGNOSIS — K219 Gastro-esophageal reflux disease without esophagitis: Secondary | ICD-10-CM

## 2021-09-17 DIAGNOSIS — Z8546 Personal history of malignant neoplasm of prostate: Secondary | ICD-10-CM | POA: Diagnosis not present

## 2021-09-17 DIAGNOSIS — Z0000 Encounter for general adult medical examination without abnormal findings: Secondary | ICD-10-CM | POA: Diagnosis not present

## 2021-09-17 LAB — CBC WITH DIFFERENTIAL/PLATELET
Basophils Absolute: 0 10*3/uL (ref 0.0–0.1)
Basophils Relative: 0.7 % (ref 0.0–3.0)
Eosinophils Absolute: 0.2 10*3/uL (ref 0.0–0.7)
Eosinophils Relative: 4 % (ref 0.0–5.0)
HCT: 45.1 % (ref 39.0–52.0)
Hemoglobin: 14.8 g/dL (ref 13.0–17.0)
Lymphocytes Relative: 25.4 % (ref 12.0–46.0)
Lymphs Abs: 1.4 10*3/uL (ref 0.7–4.0)
MCHC: 32.8 g/dL (ref 30.0–36.0)
MCV: 88.6 fl (ref 78.0–100.0)
Monocytes Absolute: 0.5 10*3/uL (ref 0.1–1.0)
Monocytes Relative: 8.4 % (ref 3.0–12.0)
Neutro Abs: 3.3 10*3/uL (ref 1.4–7.7)
Neutrophils Relative %: 61.5 % (ref 43.0–77.0)
Platelets: 189 10*3/uL (ref 150.0–400.0)
RBC: 5.09 Mil/uL (ref 4.22–5.81)
RDW: 13.6 % (ref 11.5–15.5)
WBC: 5.4 10*3/uL (ref 4.0–10.5)

## 2021-09-17 LAB — LIPID PANEL
Cholesterol: 194 mg/dL (ref 0–200)
HDL: 48 mg/dL (ref >39.00)
LDL Cholesterol: 116 mg/dL — ABNORMAL HIGH (ref 0–99)
NonHDL: 146.36
Total CHOL/HDL Ratio: 4
Triglycerides: 150 mg/dL — ABNORMAL HIGH (ref 0.0–149.0)
VLDL: 30 mg/dL (ref 0.0–40.0)

## 2021-09-17 LAB — URINALYSIS
Bilirubin Urine: NEGATIVE
Hgb urine dipstick: NEGATIVE
Ketones, ur: NEGATIVE
Leukocytes,Ua: NEGATIVE
Nitrite: NEGATIVE
Specific Gravity, Urine: 1.015 (ref 1.000–1.030)
Total Protein, Urine: NEGATIVE
Urine Glucose: NEGATIVE
Urobilinogen, UA: 1 (ref 0.0–1.0)
pH: 7 (ref 5.0–8.0)

## 2021-09-17 LAB — COMPREHENSIVE METABOLIC PANEL
ALT: 12 U/L (ref 0–53)
AST: 17 U/L (ref 0–37)
Albumin: 4.5 g/dL (ref 3.5–5.2)
Alkaline Phosphatase: 60 U/L (ref 39–117)
BUN: 16 mg/dL (ref 6–23)
CO2: 31 mEq/L (ref 19–32)
Calcium: 9.4 mg/dL (ref 8.4–10.5)
Chloride: 104 mEq/L (ref 96–112)
Creatinine, Ser: 1.16 mg/dL (ref 0.40–1.50)
GFR: 62.8 mL/min (ref >60.00)
Glucose, Bld: 88 mg/dL (ref 70–99)
Potassium: 4.7 mEq/L (ref 3.5–5.1)
Sodium: 142 mEq/L (ref 135–145)
Total Bilirubin: 0.7 mg/dL (ref 0.2–1.2)
Total Protein: 6.6 g/dL (ref 6.0–8.3)

## 2021-09-17 LAB — TSH: TSH: 0.94 u[IU]/mL (ref 0.35–5.50)

## 2021-09-17 LAB — PSA: PSA: 0.01 ng/mL — ABNORMAL LOW (ref 0.10–4.00)

## 2021-09-17 MED ORDER — LOVASTATIN 20 MG PO TABS: 20.0000 mg | ORAL_TABLET | Freq: Every day | ORAL | 3 refills | Status: DC

## 2021-09-17 MED ORDER — METOPROLOL TARTRATE 25 MG PO TABS: 12.5000 mg | ORAL_TABLET | Freq: Every day | ORAL | 3 refills | Status: DC

## 2021-09-17 MED ORDER — LANSOPRAZOLE 30 MG PO CPDR: 30.0000 mg | DELAYED_RELEASE_CAPSULE | Freq: Every day | ORAL | 3 refills | Status: DC

## 2021-09-17 NOTE — Progress Notes (Signed)
Subjective:  Patient ID: Brendan [name], male    DOB: [birth year]  Age: 71 y.o. MRN: [id number]  CC: Annual Exam (No concerns )   HPI Brendan [name] presents for well exam  Outpatient Medications Prior to Visit  Medication Sig Dispense Refill   acetaminophen (TYLENOL) 500 MG tablet Take 1 tablet (500 mg total) by mouth every 6 (six) hours as needed (pain.). 30 tablet 3   aspirin EC 81 MG EC tablet Take 1 tablet (81 mg total) by mouth daily.     chlorpheniramine (CHLOR-TRIMETON) 4 MG tablet Take 4 mg by mouth daily.     Cholecalciferol (VITAMIN D3) 50 MCG (2000 UT) TABS 2,000 Units.      hydrocortisone 2.5 % cream Apply 1 application topically daily as needed.     lansoprazole (PREVACID) 30 MG capsule Take 1 capsule (30 mg total) by mouth daily before breakfast. 90 capsule 3   lovastatin (MEVACOR) 20 MG tablet Take 1 tablet (20 mg total) by mouth daily. 90 tablet 3   metoprolol tartrate (LOPRESSOR) 25 MG tablet Take 0.5 tablets (12.5 mg total) by mouth daily. 45 tablet 3   glycopyrrolate (ROBINUL) 2 MG tablet Take 1 tablet (2 mg total) by mouth daily. 90 tablet 3   No facility-administered medications prior to visit.    ROS: Review of Systems  Constitutional:  Negative for appetite change, fatigue and unexpected weight change.  HENT:  Negative for congestion, nosebleeds, sneezing, sore throat and trouble swallowing.   Eyes:  Negative for itching and visual disturbance.  Respiratory:  Negative for cough.   Cardiovascular:  Negative for chest pain, palpitations and leg swelling.  Gastrointestinal:  Negative for abdominal distention, blood in stool, diarrhea and nausea.  Genitourinary:  Negative for frequency and hematuria.  Musculoskeletal:  Negative for back pain, gait problem, joint swelling and neck pain.  Skin:  Negative for rash.  Neurological:  Negative for dizziness, tremors, speech difficulty and weakness.  Psychiatric/Behavioral:  Negative for agitation, dysphoric mood  and sleep disturbance. The patient is not nervous/anxious.     Objective:  BP 118/74   Pulse 69   Temp 98.9 F (37.2 C) (Oral)   Ht '5\' 5"'$  (1.651 m)   Wt 175 lb 3.2 oz (79.5 kg)   SpO2 98%   BMI 29.15 kg/m   BP Readings from Last 3 Encounters:  09/17/21 118/74  01/15/21 119/68  09/14/20 121/82    Wt Readings from Last 3 Encounters:  09/17/21 175 lb 3.2 oz (79.5 kg)  01/15/21 181 lb (82.1 kg)  09/14/20 179 lb 9.6 oz (81.5 kg)    Physical Exam Constitutional:      General: He is not in acute distress.    Appearance: He is well-developed.     Comments: NAD  Eyes:     Conjunctiva/sclera: Conjunctivae normal.     Pupils: Pupils are equal, round, and reactive to light.  Neck:     Thyroid: No thyromegaly.     Vascular: No JVD.  Cardiovascular:     Rate and Rhythm: Normal rate and regular rhythm.     Heart sounds: Normal heart sounds. No murmur heard.    No friction rub. No gallop.  Pulmonary:     Effort: Pulmonary effort is normal. No respiratory distress.     Breath sounds: Normal breath sounds. No wheezing or rales.  Chest:     Chest wall: No tenderness.  Abdominal:     General: Bowel sounds are normal. There is no  distension.     Palpations: Abdomen is soft. There is no mass.     Tenderness: There is no abdominal tenderness. There is no guarding or rebound.  Musculoskeletal:        General: No tenderness. Normal range of motion.     Cervical back: Normal range of motion.  Lymphadenopathy:     Cervical: No cervical adenopathy.  Skin:    General: Skin is warm and dry.     Findings: No rash.  Neurological:     Mental Status: He is alert and oriented to person, place, and time.     Cranial Nerves: No cranial nerve deficit.     Motor: No abnormal muscle tone.     Coordination: Coordination normal.     Gait: Gait normal.     Deep Tendon Reflexes: Reflexes are normal and symmetric.  Psychiatric:        Behavior: Behavior normal.        Thought Content: Thought  content normal.        Judgment: Judgment normal.     Lab Results  Component Value Date   WBC 5.9 09/14/2020   HGB 14.7 09/14/2020   HCT 43.8 09/14/2020   PLT 180.0 09/14/2020   GLUCOSE 94 09/14/2020   CHOL 189 09/14/2020   TRIG 137.0 09/14/2020   HDL 49.40 09/14/2020   LDLCALC 112 (H) 09/14/2020   ALT 19 09/14/2020   AST 22 09/14/2020   NA 141 09/14/2020   K 4.2 09/14/2020   CL 106 09/14/2020   CREATININE 1.04 09/14/2020   BUN 12 09/14/2020   CO2 29 09/14/2020   TSH 1.01 09/14/2020   PSA 0.00 (L) 09/14/2020   INR 2.1 10/05/2018   HGBA1C 5.1 07/03/2018    CT Chest Wo Contrast  Result Date: 01/27/2020 CLINICAL DATA:  Follow-up lung nodule. EXAM: CT CHEST WITHOUT CONTRAST TECHNIQUE: Multidetector CT imaging of the chest was performed following the standard protocol without IV contrast. COMPARISON:  01/18/2019 FINDINGS: Cardiovascular: Normal heart size. No pericardial effusion. Aortic atherosclerosis. Mitral valve repair. Coronary artery calcifications. Mediastinum/Nodes: No enlarged mediastinal or axillary lymph nodes. Thyroid gland, trachea, and esophagus demonstrate no significant findings. Lungs/Pleura: No pleural effusion, airspace consolidation, or atelectasis. Index nodule within the anterior right lower lobe measures 0.6 cm, image 96/5. Unchanged. Subpleural nodule in the right lower lobe measures 5 mm, image 71/5. Also unchanged. Pleural calcification along the right lung base. No new or suspicious lung nodules. Upper Abdomen: No acute abnormality. Right lobe of liver cyst is unchanged measuring 2.4 x 1.9 cm. Musculoskeletal: No acute or suspicious osseous findings. Mild multilevel thoracic degenerative disc disease. IMPRESSION: 1. Stable small pulmonary nodules within the right lower lobe. Imaging findings are consistent with a benign process require no further follow-up. 2. Coronary artery calcifications. 3. Aortic atherosclerosis. Aortic Atherosclerosis (ICD10-I70.0).  Electronically Signed   By: Kerby Moors M.D.   On: 01/27/2020 11:27    Assessment & Plan:   Problem List Items Addressed This Visit     Dyslipidemia   Relevant Medications   lovastatin (MEVACOR) 20 MG tablet   Other Relevant Orders   TSH   Lipid panel   GERD   Relevant Medications   lansoprazole (PREVACID) 30 MG capsule   Other Relevant Orders   CBC with Differential/Platelet   PROSTATE CANCER, HX OF    PSA monitoring      Relevant Orders   PSA   Well adult exam - Primary    We discussed age appropriate  health related issues, including available/recomended screening tests and vaccinations. Labs were ordered to be later reviewed . All questions were answered. We discussed one or more of the following - seat belt use, use of sunscreen/sun exposure exercise, fall risk reduction, second hand smoke exposure, firearm use and storage, seat belt use, a need for adhering to healthy diet and exercise. Labs were ordered.  All questions were answered. Cologuard (-) 2015 2019. Cologuard due in April 2025 Shingrix, Prevnar 21, RSV -declined       Relevant Orders   TSH   Urinalysis   CBC with Differential/Platelet   Lipid panel   PSA   Comprehensive metabolic panel      Meds ordered this encounter  Medications   metoprolol tartrate (LOPRESSOR) 25 MG tablet    Sig: Take 0.5 tablets (12.5 mg total) by mouth daily.    Dispense:  45 tablet    Refill:  3   lovastatin (MEVACOR) 20 MG tablet    Sig: Take 1 tablet (20 mg total) by mouth daily.    Dispense:  90 tablet    Refill:  3   lansoprazole (PREVACID) 30 MG capsule    Sig: Take 1 capsule (30 mg total) by mouth daily before breakfast.    Dispense:  90 capsule    Refill:  3      Follow-up: Return in about 1 year (around 09/18/2022) for Wellness Exam.  Walker Kehr, MD

## 2021-09-17 NOTE — Assessment & Plan Note (Signed)
PSA monitoring

## 2021-09-17 NOTE — Assessment & Plan Note (Addendum)
We discussed age appropriate health related issues, including available/recomended screening tests and vaccinations. Labs were ordered to be later reviewed . All questions were answered. We discussed one or more of the following - seat belt use, use of sunscreen/sun exposure exercise, fall risk reduction, second hand smoke exposure, firearm use and storage, seat belt use, a need for adhering to healthy diet and exercise. Labs were ordered.  All questions were answered. Cologuard (-) 2015 2019. Cologuard due in April 2025 Shingrix, Prevnar 21, RSV -declined

## 2021-10-17 DIAGNOSIS — D485 Neoplasm of uncertain behavior of skin: Secondary | ICD-10-CM | POA: Diagnosis not present

## 2021-10-17 DIAGNOSIS — D1801 Hemangioma of skin and subcutaneous tissue: Secondary | ICD-10-CM | POA: Diagnosis not present

## 2021-10-17 DIAGNOSIS — L570 Actinic keratosis: Secondary | ICD-10-CM | POA: Diagnosis not present

## 2021-10-17 DIAGNOSIS — L812 Freckles: Secondary | ICD-10-CM | POA: Diagnosis not present

## 2021-10-17 DIAGNOSIS — Z85828 Personal history of other malignant neoplasm of skin: Secondary | ICD-10-CM | POA: Diagnosis not present

## 2021-10-17 DIAGNOSIS — L720 Epidermal cyst: Secondary | ICD-10-CM | POA: Diagnosis not present

## 2021-10-17 DIAGNOSIS — L821 Other seborrheic keratosis: Secondary | ICD-10-CM | POA: Diagnosis not present

## 2021-10-17 DIAGNOSIS — L820 Inflamed seborrheic keratosis: Secondary | ICD-10-CM | POA: Diagnosis not present

## 2021-10-17 DIAGNOSIS — D225 Melanocytic nevi of trunk: Secondary | ICD-10-CM | POA: Diagnosis not present

## 2021-11-01 DIAGNOSIS — L7211 Pilar cyst: Secondary | ICD-10-CM | POA: Diagnosis not present

## 2021-11-01 DIAGNOSIS — Z85828 Personal history of other malignant neoplasm of skin: Secondary | ICD-10-CM | POA: Diagnosis not present

## 2021-11-12 ENCOUNTER — Ambulatory Visit: Payer: Self-pay | Admitting: Licensed Clinical Social Worker

## 2021-11-12 NOTE — Patient Outreach (Signed)
  Care Coordination   Initial Visit Note   11/12/2021 Name: JACODY BENEKE MRN: [id number] DOB: [birth year]  Elyse Jarvis Lok is a 71 y.o. year old male who sees Plotnikov, [name], MD for primary care. I spoke with  Elyse Jarvis Sisneros by phone today.  What matters to the patients health and wellness today?    Declined Care Coordination Services .     Goals Addressed             This Visit's Progress    COMPLETED: Care Coordination Activities No Follow up Required       Care Coordination Interventions: Reviewed Care Coordination Services:Declined            SDOH assessments and interventions completed:  No    Care Coordination Interventions Activated:  No  Care Coordination Interventions:  Yes, provided   Follow up plan: No further intervention required.   Encounter Outcome:  Pt. [place], [place] [phone]

## 2021-11-12 NOTE — Patient Instructions (Signed)
Visit Information  Thank you for taking time to visit with me today. Please don't hesitate to contact me if I can be of assistance to you.   Following are the goals we discussed today:   Goals Addressed             This Visit's Progress    COMPLETED: Care Coordination Activities No Follow up Required       Care Coordination Interventions: Reviewed Care Coordination Services:Declined           Patient verbalizes understanding of instructions and care plan provided today and agrees to view in [place]. Active MyChart status and patient understanding of how to access instructions and care plan via MyChart confirmed with patient.     No further follow up required: by Care Coordination at this time  Care Coordination team works in collaboration with your primary care doctor.  Please call [phone] if you would like to schedule a phone appointment with a Nurse or Social work Care Coordinator to assist with navigating your physical and mental health needs.    Casimer Lanius, [place] [phone]

## 2021-11-13 ENCOUNTER — Encounter: Payer: Self-pay | Admitting: Internal Medicine

## 2022-01-10 NOTE — Progress Notes (Signed)
Cardiology Office Note   Date:  01/11/2022   ID:  Brendan [name], DOB [birth year], MRN [id number]  PCP:  Cassandria Anger, MD  Cardiologist:   Minus Breeding, MD Referring:  Cassandria Anger, MD    Chief Complaint  Patient presents with   Mitral Valve Repair      History of Present Illness: Brendan [name] is a 71 y.o. male who is referred by Plotnikov, [name], MD male with who presents for follow up after minimally invasive MVR.   This was stable on echo in Sept 2022.    Since I last saw him he has done quite well. The patient denies any new symptoms such as chest discomfort, neck or arm discomfort. There has been no new shortness of breath, PND or orthopnea. There have been no reported palpitations, presyncope or syncope.    Past Medical History:  Diagnosis Date   Cancer [facility])    prostate.  skin basal cell   Decreased hearing    ED (erectile dysfunction)    GERD (gastroesophageal reflux disease)    Heart murmur    Hyperlipidemia    IBS (irritable bowel syndrome)    Incidental pulmonary nodule, > 47m and < 817m5/28/2020   Right lung - needs f/u CT 6 months   MVP (mitral valve prolapse)    Nonrheumatic mitral valve regurgitation    Ruptured cervical disc 2008   S/P minimally invasive mitral valve repair 07/08/2018   Complex valvuloplasty including triangular resection of posterior leaflet, artificial Gore-tex neochord placement x6 and 30 mm Sorin Memo 4D ring annuloplasty via right mini thoracotomy approach    Past Surgical History:  Procedure Laterality Date   ACHILLES TENDON SURGERY  1980   prosthesis   AMPUTATION FINGER / THUMB Right 12/2004   tip of thumb for strep infection   EYE SURGERY     MITRAL VALVE REPAIR Right 07/08/2018   Procedure: MINIMALLY INVASIVE MITRAL VALVE REPAIR (MVR);  Surgeon: OwRexene AlbertsMD;  [facility];  Laterality: Right;  GLUTARALDEHYDE   PROSTATECTOMY  2007   RIGHT/LEFT HEART CATH  AND CORONARY ANGIOGRAPHY N/A 06/17/2018   Procedure: RIGHT/LEFT HEART CATH AND CORONARY ANGIOGRAPHY;  Surgeon: CoSherren MochaMD;  [place];  Service: Cardiovascular;  Laterality: N/A;   TEE WITHOUT CARDIOVERSION N/A 04/06/2018   Procedure: TRANSESOPHAGEAL ECHOCARDIOGRAM (TEE);  Surgeon: CrSanda KleinMD;  [facility] [facility]: [facility];  Laterality: N/A;   TEE WITHOUT CARDIOVERSION N/A 07/08/2018   Procedure: TRANSESOPHAGEAL ECHOCARDIOGRAM (TEE);  Surgeon: OwRexene AlbertsMD;  [place] [facility] [facility]: [facility];  Laterality: N/A;   TONSILLECTOMY       Current Outpatient Medications  Medication Sig Dispense Refill   acetaminophen (TYLENOL) 500 MG tablet Take 1 tablet (500 mg total) by mouth every 6 (six) hours as needed (pain.). 30 tablet 3   aspirin EC 81 MG EC tablet Take 1 tablet (81 mg total) by mouth daily.     chlorpheniramine (CHLOR-TRIMETON) 4 MG tablet Take 4 mg by mouth daily.     Cholecalciferol (VITAMIN D3) 50 MCG (2000 UT) TABS 2,000 Units.      hydrocortisone 2.5 % cream Apply 1 application topically daily as needed.     lansoprazole (PREVACID) 30 MG capsule Take 1 capsule (30 mg total) by mouth daily before breakfast. 90 capsule 3   lovastatin (MEVACOR) 20 MG tablet Take 1 tablet (20  mg total) by mouth daily. 90 tablet 3   metoprolol tartrate (LOPRESSOR) 25 MG tablet Take 0.5 tablets (12.5 mg total) by mouth daily. 45 tablet 3   No current facility-administered medications for this visit.    Allergies:   Hydrocodone    ROS:  Please see the history of present illness.   Otherwise, review of systems are positive for none.   All other systems are reviewed and negative.    PHYSICAL EXAM: VS:  BP 136/80 (BP Location: Left Arm, Patient Position: Sitting, Cuff Size: Normal)   Pulse 71   Ht '5\' 9"'$  (1.753 m)   Wt 172 lb (78 kg)   BMI 25.40 kg/m  , BMI Body mass index is 25.4 kg/m. GENERAL:  Well appearing NECK:  No jugular  venous distention, waveform within normal limits, carotid upstroke brisk and symmetric, no bruits, no thyromegaly LUNGS:  Clear to auscultation bilaterally CHEST:  Well healed surgical scar HEART:  PMI not displaced or sustained,S1 and S2 within normal limits, no S3, no S4, no clicks, no rubs, no murmurs ABD:  Flat, positive bowel sounds normal in frequency in pitch, no bruits, no rebound, no guarding, no midline pulsatile mass, no hepatomegaly, no splenomegaly EXT:  2 plus pulses throughout, no edema, no cyanosis no clubbing   EKG:  EKG is ordered today The ekg ordered today demonstrates sinus rhythm, rate 71, axis within normal limits, intervals within normal limits, no acute ST-T wave changes.   Recent Labs: 09/17/2021: ALT 12; BUN 16; Creatinine, Ser 1.16; Hemoglobin 14.8; Platelets 189.0; Potassium 4.7; Sodium 142; TSH 0.94    Lipid Panel    Component Value Date/Time   CHOL 194 09/17/2021 0856   TRIG 150.0 (H) 09/17/2021 0856   HDL 48.00 09/17/2021 0856   CHOLHDL 4 09/17/2021 0856   VLDL 30.0 09/17/2021 0856   LDLCALC 116 (H) 09/17/2021 0856      Wt Readings from Last 3 Encounters:  01/11/22 172 lb (78 kg)  09/17/21 175 lb 3.2 oz (79.5 kg)  01/15/21 181 lb (82.1 kg)       ASSESSMENT AND PLAN:   MV Repair:   This was stable on echo in 2022.  His exam is unchanged.  He has no symptoms.  Change in therapy or imaging is indicated.  He understands endocarditis prophylaxis.  Dyslipidemia:  LDL is 116.  I encouraged him to take his Mevacor every day as he is only taking it every other day.    Current medicines are reviewed at length with the patient today.  The patient does not have concerns regarding medicines.  The following changes have been made: As above  Labs/ tests ordered today include:   None  Orders Placed This Encounter  Procedures   EKG 12-Lead      Disposition:   FU with me one year   Signed, Minus Breeding, MD  01/11/2022 10:42 AM    [place]

## 2022-01-11 ENCOUNTER — Ambulatory Visit: Payer: Medicare Other | Attending: Cardiology | Admitting: Cardiology

## 2022-01-11 ENCOUNTER — Encounter: Payer: Self-pay | Admitting: Cardiology

## 2022-01-11 VITALS — BP 136/80 | HR 71 | Ht 69.0 in | Wt 172.0 lb

## 2022-01-11 DIAGNOSIS — I340 Nonrheumatic mitral (valve) insufficiency: Secondary | ICD-10-CM | POA: Diagnosis not present

## 2022-01-11 NOTE — Patient Instructions (Signed)
Medication Instructions:   Your physician recommends that you continue on your current medications as directed. Please refer to the Current Medication list given to you today.  *If you need a refill on your cardiac medications before your next appointment, please call your pharmacy*  Lab Work: NONE ordered at this time of appointment   If you have labs (blood work) drawn today and your tests are completely normal, you will receive your results only by: MyChart Message (if you have MyChart) OR A paper copy in the mail If you have any lab test that is abnormal or we need to change your treatment, we will call you to review the results.  Testing/Procedures: NONE ordered at this time of appointment   Follow-Up: At [facility], you and your health needs are our priority.  As part of our continuing mission to provide you with exceptional heart care, we have created designated Provider Care Teams.  These Care Teams include your primary Cardiologist (physician) and Advanced Practice Providers (APPs -  Physician Assistants and Nurse Practitioners) who all work together to provide you with the care you need, when you need it.  Your next appointment:   1 year(s)  The format for your next appointment:   In Person  Provider:   James Hochrein, MD     Other Instructions  Important Information About Sugar       

## 2022-01-20 ENCOUNTER — Ambulatory Visit
Admission: RE | Admit: 2022-01-20 | Discharge: 2022-01-20 | Disposition: A | Discharge status: Home / Self Care | Payer: Medicare Other | Source: Ambulatory Visit | Attending: Emergency Medicine | Admitting: Emergency Medicine

## 2022-01-20 VITALS — BP 112/71 | HR 71 | Temp 98.2°F | Resp 18 | Ht 69.0 in | Wt 172.0 lb

## 2022-01-20 DIAGNOSIS — R0981 Nasal congestion: Secondary | ICD-10-CM | POA: Insufficient documentation

## 2022-01-20 DIAGNOSIS — Z1152 Encounter for screening for COVID-19: Secondary | ICD-10-CM | POA: Diagnosis not present

## 2022-01-20 DIAGNOSIS — B349 Viral infection, unspecified: Secondary | ICD-10-CM | POA: Diagnosis not present

## 2022-01-20 LAB — RESP PANEL BY RT-PCR (FLU A&B, COVID) ARPGX2
Influenza A by PCR: POSITIVE — AB
Influenza B by PCR: NEGATIVE
SARS Coronavirus 2 by RT PCR: NEGATIVE

## 2022-01-20 MED ORDER — BENZONATATE 100 MG PO CAPS: 100.0000 mg | ORAL_CAPSULE | Freq: Three times a day (TID) | ORAL | 0 refills | Status: DC | PRN

## 2022-01-20 NOTE — ED Provider Notes (Signed)
Brendan [name]    CSN: [id number] Arrival date & time: 01/20/22  1102      History   Chief Complaint Chief Complaint  Patient presents with   Nasal Congestion    Entered by patient    HPI Brendan [name] is a 71 y.o. male.  Patient presents with 2 day history of headache, congestion, sinus pressure, cough.  No fever, rash, shortness of breath, vomiting, diarrhea, or other symptoms.  Treatment at home with Tylenol; no OTC medications taken today.  His medical history includes allergic rhinitis, mitral valve regurgitation, mitral valve repair, GERD, prostate cancer.  The history is provided by the patient and medical records.    Past Medical History:  Diagnosis Date   Cancer [facility])    prostate.  skin basal cell   Decreased hearing    ED (erectile dysfunction)    GERD (gastroesophageal reflux disease)    Heart murmur    Hyperlipidemia    IBS (irritable bowel syndrome)    Incidental pulmonary nodule, > 32m and < 830m5/28/2020   Right lung - needs f/u CT 6 months   MVP (mitral valve prolapse)    Nonrheumatic mitral valve regurgitation    Ruptured cervical disc 2008   S/P minimally invasive mitral valve repair 07/08/2018   Complex valvuloplasty including triangular resection of posterior leaflet, artificial Gore-tex neochord placement x6 and 30 mm Sorin Memo 4D ring annuloplasty via right mini thoracotomy approach    Patient Active Problem List   Diagnosis Date Noted   Nasal congestion 01/20/2022   Atherosclerosis of aorta ([place]08/12/2020   Sinusitis 06/25/2020   Absolute anemia 09/24/2018   Anticoagulated 09/24/2018   Long term (current) use of anticoagulants 07/16/2018   S/P minimally invasive mitral valve repair 07/08/2018   Incidental pulmonary nodule, > 29m70mnd < 8mm31m/28/2020   Tick bite 06/25/2018   Nonrheumatic mitral valve regurgitation    Heart murmur 03/02/2018   Leg pain, inferior, right 03/02/2018   External otitis of left ear 07/15/2017   Viral  URI 03/29/2015   Cellulitis of left buttock 01/18/2013   Hearing loss of aging 08/03/2012   Well adult exam 09/02/2011   Acute conjunctivitis, left eye 07/16/2011   FEVER UNSPECIFIED 04/11/2009   Cough 04/11/2009   Dyslipidemia 07/28/2007   Allergic rhinitis 07/28/2007   GERD 07/28/2007   ERECTILE DYSFUNCTION 07/28/2007   PROSTATE CANCER, HX OF 07/28/2007   SKIN CANCER, HX OF 07/28/2007    Past Surgical History:  Procedure Laterality Date   ACHILLES TENDON SURGERY  1980   prosthesis   AMPUTATION FINGER / THUMB Right 12/2004   tip of thumb for strep infection   EYE SURGERY     MITRAL VALVE REPAIR Right 07/08/2018   Procedure: MINIMALLY INVASIVE MITRAL VALVE REPAIR (MVR);  Surgeon: OwenRexene Alberts;  [facility];  Laterality: Right;  GLUTARALDEHYDE   PROSTATECTOMY  2007   RIGHT/LEFT HEART CATH AND CORONARY ANGIOGRAPHY N/A 06/17/2018   Procedure: RIGHT/LEFT HEART CATH AND CORONARY ANGIOGRAPHY;  Surgeon: CoopSherren Mocha;  [facility];  Service: Cardiovascular;  Laterality: N/A;   TEE WITHOUT CARDIOVERSION N/A 04/06/2018   Procedure: TRANSESOPHAGEAL ECHOCARDIOGRAM (TEE);  Surgeon: CroiSanda Klein;  [facility] Cardi[facility];  Laterality: N/A;   TEE WITHOUT CARDIOVERSION N/A 07/08/2018   Procedure: TRANSESOPHAGEAL ECHOCARDIOGRAM (TEE);  Surgeon: OwenRexene Alberts;  [facility];  Laterality: N/A;  TONSILLECTOMY         Home Medications    Prior to Admission medications   Medication Sig Start Date End Date Taking? Authorizing Provider  benzonatate (TESSALON) 100 MG capsule Take 1 capsule (100 mg total) by mouth 3 (three) times daily as needed for cough. 01/20/22  Yes Sharion Balloon, NP  acetaminophen (TYLENOL) 500 MG tablet Take 1 tablet (500 mg total) by mouth every 6 (six) hours as needed (pain.). 07/28/19   Plotnikov, [name], MD  aspirin EC 81 MG EC tablet Take 1  tablet (81 mg total) by mouth daily. 07/13/18   Antony Odea, PA-C  chlorpheniramine (CHLOR-TRIMETON) 4 MG tablet Take 4 mg by mouth daily.    [provider]  Cholecalciferol (VITAMIN D3) 50 MCG (2000 UT) TABS 2,000 Units.  09/05/18   [provider]  hydrocortisone 2.5 % cream Apply 1 application topically daily as needed. 10/24/18   [provider]  lansoprazole (PREVACID) 30 MG capsule Take 1 capsule (30 mg total) by mouth daily before breakfast. 09/17/21   Plotnikov, [name], MD  lovastatin (MEVACOR) 20 MG tablet Take 1 tablet (20 mg total) by mouth daily. 09/17/21   Plotnikov, [name], MD  metoprolol tartrate (LOPRESSOR) 25 MG tablet Take 0.5 tablets (12.5 mg total) by mouth daily. 09/17/21   Plotnikov, [name], MD    Family History Family History  Problem Relation Age of Onset   Heart disease Mother        arrhythmia, angina   Heart disease Father 45       CAD, MVR   Hyperlipidemia Other     Social History Social History   Tobacco Use   Smoking status: Never   Smokeless tobacco: Never  Vaping Use   Vaping Use: Never used  Substance Use Topics   Alcohol use: No    Comment: occasionally   Drug use: No     Allergies   Hydrocodone   Review of Systems Review of Systems  Constitutional:  Negative for chills and fever.  HENT:  Positive for congestion, postnasal drip, rhinorrhea and sinus pressure. Negative for ear pain and sore throat.   Respiratory:  Positive for cough. Negative for shortness of breath.   Cardiovascular:  Negative for chest pain and palpitations.  Gastrointestinal:  Negative for diarrhea and vomiting.  Skin:  Negative for color change and rash.  All other systems reviewed and are negative.    Physical Exam Triage Vital Signs ED Triage Vitals  Enc Vitals Group     BP      Pulse      Resp      Temp      Temp src      SpO2      Weight      Height      Head Circumference      Peak Flow      Pain Score       Pain Loc      Pain Edu?      Excl. in [place]?    No data found.  Updated Vital Signs BP 112/71   Pulse 71   Temp 98.2 F (36.8 C)   Resp 18   Ht '5\' 9"'$  (1.753 m)   Wt 172 lb (78 kg)   SpO2 97%   BMI 25.40 kg/m   Visual Acuity Right Eye Distance:   Left Eye Distance:   Bilateral Distance:    Right Eye Near:   Left  Eye Near:    Bilateral Near:     Physical Exam Vitals and nursing note reviewed.  Constitutional:      General: He is not in acute distress.    Appearance: Normal appearance. He is well-developed. He is not ill-appearing.  HENT:     Right Ear: Tympanic membrane normal.     Left Ear: Tympanic membrane normal.     Nose: Nose normal.     Mouth/Throat:     Mouth: Mucous membranes are moist.     Pharynx: Oropharynx is clear.     Comments: Clear PND. Cardiovascular:     Rate and Rhythm: Normal rate and regular rhythm.     Heart sounds: Normal heart sounds.  Pulmonary:     Effort: Pulmonary effort is normal. No respiratory distress.     Breath sounds: Normal breath sounds.  Musculoskeletal:     Cervical back: Neck supple.  Skin:    General: Skin is warm and dry.  Neurological:     Mental Status: He is alert.  Psychiatric:        Mood and Affect: Mood normal.        Behavior: Behavior normal.      UC Treatments / Results  Labs (all labs ordered are listed, but only abnormal results are displayed) Labs Reviewed  RESP PANEL BY RT-PCR (FLU A&B, COVID) [id number]    EKG   Radiology No results found.  Procedures Procedures (including critical care time)  Medications Ordered in UC Medications - No data to display  Initial Impression / Assessment and Plan / UC Course  I have reviewed the triage vital signs and the nursing notes.  Pertinent labs & imaging results that were available during my care of the patient were reviewed by me and considered in my medical decision making (see chart for details).    Viral illness.  Afebrile and vital signs are  stable.  Lungs are clear and O2 sat is 97% on room air.  Treating cough with Tessalon Perles.  COVID and Flu pending.  Discussed symptomatic treatment including Tylenol, rest, hydration.  Instructed patient to follow up with his PCP if symptoms are not improving.  He agrees to plan of care.   Final Clinical Impressions(s) / UC Diagnoses   Final diagnoses:  Nasal congestion  Viral illness     Discharge Instructions      Take the Tessalon Perles as directed for cough.    Your COVID and Flu tests are pending.    Take Tylenol as needed for fever or discomfort.  Rest and keep yourself hydrated.    Follow-up with your primary care provider if your symptoms are not improving.         ED Prescriptions     Medication Sig Dispense Auth. Provider   benzonatate (TESSALON) 100 MG capsule Take 1 capsule (100 mg total) by mouth 3 (three) times daily as needed for cough. 21 capsule Sharion Balloon, NP      PDMP not reviewed this encounter.   Sharion Balloon, NP 01/20/22 1157

## 2022-01-20 NOTE — ED Triage Notes (Signed)
Patient to Urgent Care with complaints of nasal congestion, headaches, drainage, sinus pain/ pressure and productive cough with green phlegm x2 days.   Denies any known fevers, max temp 99.2.  Has been taking tylenol.

## 2022-01-20 NOTE — Discharge Instructions (Addendum)
Take the [facility] as directed for cough.    Your COVID and Flu tests are pending.    Take Tylenol as needed for fever or discomfort.  Rest and keep yourself hydrated.    Follow-up with your primary care provider if your symptoms are not improving.

## 2022-04-10 DIAGNOSIS — D225 Melanocytic nevi of trunk: Secondary | ICD-10-CM | POA: Diagnosis not present

## 2022-04-10 DIAGNOSIS — L570 Actinic keratosis: Secondary | ICD-10-CM | POA: Diagnosis not present

## 2022-04-10 DIAGNOSIS — L812 Freckles: Secondary | ICD-10-CM | POA: Diagnosis not present

## 2022-04-10 DIAGNOSIS — D1801 Hemangioma of skin and subcutaneous tissue: Secondary | ICD-10-CM | POA: Diagnosis not present

## 2022-04-10 DIAGNOSIS — H9313 Tinnitus, bilateral: Secondary | ICD-10-CM | POA: Diagnosis not present

## 2022-04-10 DIAGNOSIS — L821 Other seborrheic keratosis: Secondary | ICD-10-CM | POA: Diagnosis not present

## 2022-04-10 DIAGNOSIS — H903 Sensorineural hearing loss, bilateral: Secondary | ICD-10-CM | POA: Diagnosis not present

## 2022-04-10 DIAGNOSIS — Z822 Family history of deafness and hearing loss: Secondary | ICD-10-CM | POA: Diagnosis not present

## 2022-04-10 DIAGNOSIS — L218 Other seborrheic dermatitis: Secondary | ICD-10-CM | POA: Diagnosis not present

## 2022-04-10 DIAGNOSIS — Z85828 Personal history of other malignant neoplasm of skin: Secondary | ICD-10-CM | POA: Diagnosis not present

## 2022-04-22 ENCOUNTER — Encounter: Payer: Self-pay | Admitting: Internal Medicine

## 2022-05-08 DIAGNOSIS — H903 Sensorineural hearing loss, bilateral: Secondary | ICD-10-CM | POA: Diagnosis not present

## 2022-06-27 DIAGNOSIS — Z961 Presence of intraocular lens: Secondary | ICD-10-CM | POA: Diagnosis not present

## 2022-07-09 DIAGNOSIS — B078 Other viral warts: Secondary | ICD-10-CM | POA: Diagnosis not present

## 2022-07-09 DIAGNOSIS — Z85828 Personal history of other malignant neoplasm of skin: Secondary | ICD-10-CM | POA: Diagnosis not present

## 2022-07-11 ENCOUNTER — Telehealth: Payer: Self-pay | Admitting: Radiology

## 2022-07-11 NOTE — Telephone Encounter (Signed)
Contacted Brendan [name] to schedule their annual wellness visit. Patient declined to schedule AWV at this time.  Graylon Good CMA

## 2022-08-07 DIAGNOSIS — Z85828 Personal history of other malignant neoplasm of skin: Secondary | ICD-10-CM | POA: Diagnosis not present

## 2022-08-07 DIAGNOSIS — L218 Other seborrheic dermatitis: Secondary | ICD-10-CM | POA: Diagnosis not present

## 2022-09-04 ENCOUNTER — Encounter (INDEPENDENT_AMBULATORY_CARE_PROVIDER_SITE_OTHER): Payer: Self-pay

## 2022-09-24 ENCOUNTER — Ambulatory Visit (INDEPENDENT_AMBULATORY_CARE_PROVIDER_SITE_OTHER): Payer: Medicare Other | Admitting: Internal Medicine

## 2022-09-24 ENCOUNTER — Encounter: Payer: Self-pay | Admitting: Internal Medicine

## 2022-09-24 VITALS — BP 120/80 | HR 68 | Temp 98.0°F | Ht 69.0 in | Wt 170.0 lb

## 2022-09-24 DIAGNOSIS — K219 Gastro-esophageal reflux disease without esophagitis: Secondary | ICD-10-CM

## 2022-09-24 DIAGNOSIS — Z0000 Encounter for general adult medical examination without abnormal findings: Secondary | ICD-10-CM | POA: Diagnosis not present

## 2022-09-24 DIAGNOSIS — E785 Hyperlipidemia, unspecified: Secondary | ICD-10-CM | POA: Diagnosis not present

## 2022-09-24 DIAGNOSIS — Z8546 Personal history of malignant neoplasm of prostate: Secondary | ICD-10-CM | POA: Diagnosis not present

## 2022-09-24 DIAGNOSIS — I700 Atherosclerosis of aorta: Secondary | ICD-10-CM

## 2022-09-24 LAB — URINALYSIS
Bilirubin Urine: NEGATIVE
Hgb urine dipstick: NEGATIVE
Ketones, ur: NEGATIVE
Leukocytes,Ua: NEGATIVE
Nitrite: NEGATIVE
Specific Gravity, Urine: 1.02 (ref 1.000–1.030)
Total Protein, Urine: NEGATIVE
Urine Glucose: NEGATIVE
Urobilinogen, UA: 1 (ref 0.0–1.0)
pH: 7 (ref 5.0–8.0)

## 2022-09-24 LAB — COMPREHENSIVE METABOLIC PANEL
ALT: 11 U/L (ref 0–53)
AST: 15 U/L (ref 0–37)
Albumin: 4.6 g/dL (ref 3.5–5.2)
Alkaline Phosphatase: 66 U/L (ref 39–117)
BUN: 15 mg/dL (ref 6–23)
CO2: 30 mEq/L (ref 19–32)
Calcium: 9.4 mg/dL (ref 8.4–10.5)
Chloride: 105 mEq/L (ref 96–112)
Creatinine, Ser: 1.11 mg/dL (ref 0.40–1.50)
GFR: 65.74 mL/min (ref >60.00)
Glucose, Bld: 92 mg/dL (ref 70–99)
Potassium: 4.2 mEq/L (ref 3.5–5.1)
Sodium: 142 mEq/L (ref 135–145)
Total Bilirubin: 0.7 mg/dL (ref 0.2–1.2)
Total Protein: 6.6 g/dL (ref 6.0–8.3)

## 2022-09-24 LAB — LIPID PANEL
Cholesterol: 190 mg/dL (ref 0–200)
HDL: 50.9 mg/dL (ref >39.00)
LDL Cholesterol: 122 mg/dL — ABNORMAL HIGH (ref 0–99)
NonHDL: 139.26
Total CHOL/HDL Ratio: 4
Triglycerides: 86 mg/dL (ref 0.0–149.0)
VLDL: 17.2 mg/dL (ref 0.0–40.0)

## 2022-09-24 LAB — CBC WITH DIFFERENTIAL/PLATELET
Basophils Absolute: 0.1 10*3/uL (ref 0.0–0.1)
Basophils Relative: 1 % (ref 0.0–3.0)
Eosinophils Absolute: 0.2 10*3/uL (ref 0.0–0.7)
Eosinophils Relative: 3.9 % (ref 0.0–5.0)
HCT: 46.6 % (ref 39.0–52.0)
Hemoglobin: 15.1 g/dL (ref 13.0–17.0)
Lymphocytes Relative: 25.2 % (ref 12.0–46.0)
Lymphs Abs: 1.4 10*3/uL (ref 0.7–4.0)
MCHC: 32.5 g/dL (ref 30.0–36.0)
MCV: 89.4 fl (ref 78.0–100.0)
Monocytes Absolute: 0.4 10*3/uL (ref 0.1–1.0)
Monocytes Relative: 8 % (ref 3.0–12.0)
Neutro Abs: 3.4 10*3/uL (ref 1.4–7.7)
Neutrophils Relative %: 61.9 % (ref 43.0–77.0)
Platelets: 188 10*3/uL (ref 150.0–400.0)
RBC: 5.21 Mil/uL (ref 4.22–5.81)
RDW: 13.7 % (ref 11.5–15.5)
WBC: 5.5 10*3/uL (ref 4.0–10.5)

## 2022-09-24 LAB — PSA: PSA: 0.01 ng/mL — ABNORMAL LOW (ref 0.10–4.00)

## 2022-09-24 LAB — TSH: TSH: 0.99 u[IU]/mL (ref 0.35–5.50)

## 2022-09-24 MED ORDER — LANSOPRAZOLE 30 MG PO CPDR: 30.0000 mg | DELAYED_RELEASE_CAPSULE | Freq: Every day | ORAL | 3 refills | Status: DC

## 2022-09-24 MED ORDER — HYDROXYCHLOROQUINE SULFATE 200 MG PO TABS: ORAL_TABLET | ORAL | 0 refills | Status: DC

## 2022-09-24 MED ORDER — METOPROLOL TARTRATE 25 MG PO TABS: 12.5000 mg | ORAL_TABLET | Freq: Every day | ORAL | 3 refills | Status: DC

## 2022-09-24 MED ORDER — LOVASTATIN 20 MG PO TABS: 20.0000 mg | ORAL_TABLET | Freq: Every day | ORAL | 3 refills | Status: DC

## 2022-09-24 NOTE — Assessment & Plan Note (Signed)
PSA

## 2022-09-24 NOTE — Progress Notes (Signed)
Subjective:  Patient ID: Brendan [name], male    DOB: [birth year]  Age: 72 y.o. MRN: [id number]  CC: Annual Exam   HPI Brendan [name] presents for a well exam F/u on GERD, HTN   Outpatient Medications Prior to Visit  Medication Sig Dispense Refill   acetaminophen (TYLENOL) 500 MG tablet Take 1 tablet (500 mg total) by mouth every 6 (six) hours as needed (pain.). 30 tablet 3   aspirin EC 81 MG EC tablet Take 1 tablet (81 mg total) by mouth daily.     chlorpheniramine (CHLOR-TRIMETON) 4 MG tablet Take 4 mg by mouth daily.     Cholecalciferol (VITAMIN D3) 50 MCG (2000 UT) TABS 2,000 Units.      hydrocortisone 2.5 % cream Apply 1 application topically daily as needed.     lansoprazole (PREVACID) 30 MG capsule Take 1 capsule (30 mg total) by mouth daily before breakfast. 90 capsule 3   lovastatin (MEVACOR) 20 MG tablet Take 1 tablet (20 mg total) by mouth daily. 90 tablet 3   metoprolol tartrate (LOPRESSOR) 25 MG tablet Take 0.5 tablets (12.5 mg total) by mouth daily. 45 tablet 3   benzonatate (TESSALON) 100 MG capsule Take 1 capsule (100 mg total) by mouth 3 (three) times daily as needed for cough. 21 capsule 0   No facility-administered medications prior to visit.    ROS: Review of Systems  Constitutional:  Negative for appetite change, fatigue and unexpected weight change.  HENT:  Negative for congestion, nosebleeds, sneezing, sore throat and trouble swallowing.   Eyes:  Negative for itching and visual disturbance.  Respiratory:  Negative for cough.   Cardiovascular:  Negative for chest pain, palpitations and leg swelling.  Gastrointestinal:  Negative for abdominal distention, blood in stool, diarrhea and nausea.  Genitourinary:  Negative for frequency and hematuria.  Musculoskeletal:  Negative for back pain, gait problem, joint swelling and neck pain.  Skin:  Negative for rash.  Neurological:  Negative for dizziness, tremors, speech difficulty and weakness.   Psychiatric/Behavioral:  Negative for agitation, dysphoric mood and sleep disturbance. The patient is not nervous/anxious.     Objective:  BP 120/80 (BP Location: Left Arm, Patient Position: Sitting, Cuff Size: Normal)   Pulse 68   Temp 98 F (36.7 C) (Oral)   Ht 5\' 9"  (1.753 m)   Wt 170 lb (77.1 kg)   SpO2 95%   BMI 25.10 kg/m   BP Readings from Last 3 Encounters:  09/24/22 120/80  01/20/22 112/71  01/11/22 136/80    Wt Readings from Last 3 Encounters:  09/24/22 170 lb (77.1 kg)  01/20/22 172 lb (78 kg)  01/11/22 172 lb (78 kg)    Physical Exam Constitutional:      General: He is not in acute distress.    Appearance: Normal appearance. He is well-developed.     Comments: NAD  Eyes:     Conjunctiva/sclera: Conjunctivae normal.     Pupils: Pupils are equal, round, and reactive to light.  Neck:     Thyroid: No thyromegaly.     Vascular: No JVD.  Cardiovascular:     Rate and Rhythm: Normal rate and regular rhythm.     Heart sounds: Normal heart sounds. No murmur heard.    No friction rub. No gallop.  Pulmonary:     Effort: Pulmonary effort is normal. No respiratory distress.     Breath sounds: Normal breath sounds. No wheezing or rales.  Chest:     Chest wall:  No tenderness.  Abdominal:     General: Bowel sounds are normal. There is no distension.     Palpations: Abdomen is soft. There is no mass.     Tenderness: There is no abdominal tenderness. There is no guarding or rebound.  Musculoskeletal:        General: No tenderness. Normal range of motion.     Cervical back: Normal range of motion.  Lymphadenopathy:     Cervical: No cervical adenopathy.  Skin:    General: Skin is warm and dry.     Findings: No rash.  Neurological:     Mental Status: He is alert and oriented to person, place, and time.     Cranial Nerves: No cranial nerve deficit.     Motor: No abnormal muscle tone.     Coordination: Coordination normal.     Gait: Gait normal.     Deep Tendon  Reflexes: Reflexes are normal and symmetric.  Psychiatric:        Behavior: Behavior normal.        Thought Content: Thought content normal.        Judgment: Judgment normal.   Hearing aids Rectal - declined  Lab Results  Component Value Date   WBC 5.4 09/17/2021   HGB 14.8 09/17/2021   HCT 45.1 09/17/2021   PLT 189.0 09/17/2021   GLUCOSE 88 09/17/2021   CHOL 194 09/17/2021   TRIG 150.0 (H) 09/17/2021   HDL 48.00 09/17/2021   LDLCALC 116 (H) 09/17/2021   ALT 12 09/17/2021   AST 17 09/17/2021   NA 142 09/17/2021   K 4.7 09/17/2021   CL 104 09/17/2021   CREATININE 1.16 09/17/2021   BUN 16 09/17/2021   CO2 31 09/17/2021   TSH 0.94 09/17/2021   PSA 0.01 (L) 09/17/2021   INR 2.1 10/05/2018   HGBA1C 5.1 07/03/2018    No results found.  Assessment & Plan:   Problem List Items Addressed This Visit     Dyslipidemia    Lovastatin   Labs      Relevant Medications   lovastatin (MEVACOR) 20 MG tablet   Other Relevant Orders   TSH   Lipid panel   GERD    Stable, continue PPI  Potential benefits of a long term PPI use as well as potential risks  and complications were explained to the patient and were aknowledged.       Relevant Medications   lansoprazole (PREVACID) 30 MG capsule   PROSTATE CANCER, HX OF    PSA      Relevant Orders   CBC with Differential/Platelet   PSA   Well adult exam - Primary    We discussed age appropriate health related issues, including available/recomended screening tests and vaccinations. Labs were ordered to be later reviewed . All questions were answered. We discussed one or more of the following - seat belt use, use of sunscreen/sun exposure exercise, fall risk reduction, second hand smoke exposure, firearm use and storage, seat belt use, a need for adhering to healthy diet and exercise. Labs were ordered.  All questions were answered. Cologuard (-) 2015 2019, 2022. Cologuard due in April 2025 Shingrix, Prevnar 21, RSV - declined       Relevant Orders   TSH   Urinalysis   CBC with Differential/Platelet   Lipid panel   PSA   Comprehensive metabolic panel   Atherosclerosis of aorta (HCC)    On Lovastatin      Relevant Medications   lovastatin (  MEVACOR) 20 MG tablet   metoprolol tartrate (LOPRESSOR) 25 MG tablet      Meds ordered this encounter  Medications   lansoprazole (PREVACID) 30 MG capsule    Sig: Take 1 capsule (30 mg total) by mouth daily before breakfast.    Dispense:  90 capsule    Refill:  3   lovastatin (MEVACOR) 20 MG tablet    Sig: Take 1 tablet (20 mg total) by mouth daily.    Dispense:  90 tablet    Refill:  3   metoprolol tartrate (LOPRESSOR) 25 MG tablet    Sig: Take 0.5 tablets (12.5 mg total) by mouth daily.    Dispense:  45 tablet    Refill:  3   DISCONTD: hydroxychloroquine (PLAQUENIL) 200 MG tablet    Sig: Take 400 mg bid x 2 days, then 200 mg daily for 5 days    Dispense:  16 tablet    Refill:  0   hydroxychloroquine (PLAQUENIL) 200 MG tablet    Sig: Take 400 mg bid x 2 days, then 200 mg daily for 5 days    Dispense:  16 tablet    Refill:  0      Follow-up: Return in about 1 year (around 09/24/2023) for Wellness Exam.  Sonda Primes, MD

## 2022-09-24 NOTE — Assessment & Plan Note (Signed)
Lovastatin   Labs

## 2022-09-24 NOTE — Patient Instructions (Signed)
For a mild COVID-19 case - take zinc 50 mg a day for 1 week, vitamin C 1000 mg daily for 1 week, vitamin D2 50,000 units weekly for 2 months (unless  taking vitamin D daily already), an antioxidant Quercetin 500 mg twice a day for 1 week (if you can get it quick enough). Take Allegra or Benadryl.  Maintain good oral hydration and take Tylenol for high fever.  Call if problems. Isolate for 5 days, then wear a mask for 5 days per CDC.    [employer] [place] [employer]

## 2022-09-24 NOTE — Assessment & Plan Note (Signed)
We discussed age appropriate health related issues, including available/recomended screening tests and vaccinations. Labs were ordered to be later reviewed . All questions were answered. We discussed one or more of the following - seat belt use, use of sunscreen/sun exposure exercise, fall risk reduction, second hand smoke exposure, firearm use and storage, seat belt use, a need for adhering to healthy diet and exercise. Labs were ordered.  All questions were answered. Cologuard (-) 2015 2019, 2022. Cologuard due in April 2025 Shingrix, Prevnar 21, RSV - declined

## 2022-09-24 NOTE — Assessment & Plan Note (Signed)
On Lovastatin

## 2022-09-24 NOTE — Assessment & Plan Note (Signed)
Stable, continue PPI  Potential benefits of a long term PPI use as well as potential risks  and complications were explained to the patient and were aknowledged.

## 2022-10-14 DIAGNOSIS — L812 Freckles: Secondary | ICD-10-CM | POA: Diagnosis not present

## 2022-10-14 DIAGNOSIS — D1801 Hemangioma of skin and subcutaneous tissue: Secondary | ICD-10-CM | POA: Diagnosis not present

## 2022-10-14 DIAGNOSIS — L821 Other seborrheic keratosis: Secondary | ICD-10-CM | POA: Diagnosis not present

## 2022-10-14 DIAGNOSIS — Z85828 Personal history of other malignant neoplasm of skin: Secondary | ICD-10-CM | POA: Diagnosis not present

## 2022-10-14 DIAGNOSIS — D2271 Melanocytic nevi of right lower limb, including hip: Secondary | ICD-10-CM | POA: Diagnosis not present

## 2022-10-14 DIAGNOSIS — L820 Inflamed seborrheic keratosis: Secondary | ICD-10-CM | POA: Diagnosis not present

## 2022-10-14 DIAGNOSIS — D225 Melanocytic nevi of trunk: Secondary | ICD-10-CM | POA: Diagnosis not present

## 2022-10-14 DIAGNOSIS — D2272 Melanocytic nevi of left lower limb, including hip: Secondary | ICD-10-CM | POA: Diagnosis not present

## 2023-01-12 NOTE — Progress Notes (Unsigned)
  Cardiology Office Note:   Date:  01/13/2023  ID:  Rod Mae, DOB May 20, 1950, MRN [id number] PCP: Tresa Garter, MD  [facility] Providers Cardiologist:  Rollene Rotunda, MD {  History of Present Illness:   Brendan [name] is a 72 y.o. male who is referred by Plotnikov, Georgina Quint, MD male with who presents for follow up after minimally invasive MVR.   This was stable on echo in Sept 2022.  Since I last saw him he has done okay.  He does some yard work but he is not exercising like he was would like I would suggest. The patient denies any new symptoms such as chest discomfort, neck or arm discomfort. There has been no new shortness of breath, PND or orthopnea. There have been no reported palpitations, presyncope or syncope.   ROS: As stated in the HPI and negative for all other systems.  Studies Reviewed:    EKG:   EKG Interpretation Date/Time:  Monday January 13 2023 09:59:25 EST Ventricular Rate:  70 PR Interval:  174 QRS Duration:  100 QT Interval:  400 QTC Calculation: 432 R Axis:   22  Text Interpretation: Normal sinus rhythm Normal ECG When compared with ECG of 2023 No significant change since last tracing Confirmed by Rollene Rotunda ([id number]) on 01/13/2023 10:25:28 AM    Risk Assessment/Calculations:      Physical Exam:   VS:  BP (!) 142/82 (BP Location: Right Arm, Patient Position: Sitting, Cuff Size: Normal)   Pulse 71   Ht 5\' 9"  (1.753 m)   Wt 173 lb (78.5 kg)   SpO2 98%   BMI 25.55 kg/m    Wt Readings from Last 3 Encounters:  01/13/23 173 lb (78.5 kg)  09/24/22 170 lb (77.1 kg)  01/20/22 172 lb (78 kg)     GEN: Well nourished, well developed in no acute distress NECK: No JVD; No carotid bruits CARDIAC: RRR, 2 out of 6 axillary systolic murmur, no diastolic murmurs, rubs, gallops RESPIRATORY:  Clear to auscultation without rales, wheezing or rhonchi  ABDOMEN: Soft, non-tender, non-distended EXTREMITIES:  No edema; No deformity    ASSESSMENT AND PLAN:   MV Repair:   This was stable on echo in 2022.  He does have a slight murmur.  I am going to repeat an echocardiogram.  He understands endocarditis prophylaxis.\   Dyslipidemia:  LDL is 122.  He does have aortic and coronary calcium.  Given this I think the goal LDL should probably be at least less than 100 if not in the 70s.  I am going to stop his Mevacor and start Lipitor 20 mg daily.  In 3 months he will get a lipid profile and LP(a).   Follow up with me in one year.   Signed, Rollene Rotunda, MD

## 2023-01-13 ENCOUNTER — Encounter: Payer: Self-pay | Admitting: Cardiology

## 2023-01-13 ENCOUNTER — Ambulatory Visit: Payer: Medicare Other | Attending: Cardiology | Admitting: Cardiology

## 2023-01-13 VITALS — BP 142/82 | HR 71 | Ht 69.0 in | Wt 173.0 lb

## 2023-01-13 DIAGNOSIS — E785 Hyperlipidemia, unspecified: Secondary | ICD-10-CM | POA: Diagnosis not present

## 2023-01-13 DIAGNOSIS — Z9889 Other specified postprocedural states: Secondary | ICD-10-CM | POA: Diagnosis not present

## 2023-01-13 DIAGNOSIS — I340 Nonrheumatic mitral (valve) insufficiency: Secondary | ICD-10-CM | POA: Insufficient documentation

## 2023-01-13 MED ORDER — ATORVASTATIN CALCIUM 20 MG PO TABS: 20.0000 mg | ORAL_TABLET | Freq: Every day | ORAL | 3 refills | Status: DC

## 2023-01-13 NOTE — Patient Instructions (Signed)
Medication Instructions:  Stop taking Mevacor. Start taking Lipitor 20 mg daily. New script sent. *If you need a refill on your cardiac medications before your next appointment, please call your pharmacy*   Lab Work: Fasting Lipid, Lpa in 3 months. Due March 2025 If you have labs (blood work) drawn today and your tests are completely normal, you will receive your results only by: MyChart Message (if you have MyChart) OR A paper copy in the mail If you have any lab test that is abnormal or we need to change your treatment, we will call you to review the results.   Testing/Procedures: Your physician has requested that you have an echocardiogram. Echocardiography is a painless test that uses sound waves to create images of your heart. It provides your doctor with information about the size and shape of your heart and how well your heart's chambers and valves are working. This procedure takes approximately one hour. There are no restrictions for this procedure. [address]  Please do NOT wear cologne, perfume, aftershave, or lotions (deodorant is allowed). Please arrive 15 minutes prior to your appointment time.  Please note: We ask at that you not bring children with you during ultrasound (echo/ vascular) testing. Due to room size and safety concerns, children are not allowed in the ultrasound rooms during exams. Our front office staff cannot provide observation of children in our lobby area while testing is being conducted. An adult accompanying a patient to their appointment will only be allowed in the ultrasound room at the discretion of the ultrasound technician under special circumstances. We apologize for any inconvenience.    Follow-Up: At [facility], you and your health needs are our priority.  As part of our continuing mission to provide you with exceptional heart care, we have created designated Provider Care Teams.  These Care Teams include your primary Cardiologist  (physician) and Advanced Practice Providers (APPs -  Physician Assistants and Nurse Practitioners) who all work together to provide you with the care you need, when you need it.  Your next appointment:   12 month(s)  Provider:   Rollene Rotunda, MD

## 2023-02-18 ENCOUNTER — Ambulatory Visit (HOSPITAL_BASED_OUTPATIENT_CLINIC_OR_DEPARTMENT_OTHER)
Admission: RE | Admit: 2023-02-18 | Discharge: 2023-02-18 | Disposition: A | Discharge status: Home / Self Care | Payer: Medicare Other | Source: Ambulatory Visit | Attending: Family Medicine | Admitting: Family Medicine

## 2023-02-18 ENCOUNTER — Encounter (HOSPITAL_BASED_OUTPATIENT_CLINIC_OR_DEPARTMENT_OTHER): Payer: Self-pay

## 2023-02-18 VITALS — BP 129/85 | HR 91 | Temp 97.8°F | Resp 20 | Wt 170.0 lb

## 2023-02-18 DIAGNOSIS — R1013 Epigastric pain: Secondary | ICD-10-CM | POA: Diagnosis not present

## 2023-02-18 MED ORDER — SUCRALFATE 1 G PO TABS: 1.0000 g | ORAL_TABLET | Freq: Two times a day (BID) | ORAL | 0 refills | Status: DC

## 2023-02-18 NOTE — ED Triage Notes (Signed)
 Upper abdominal pain last night after eating dinner followed by acid reflux.  States this afternoon, had severe pain then dark green emesis. Pain is gone at present. States has a hx of GERD. Denies diaphoresis, sob with episode last night or today. Mitral valve repair 2020

## 2023-02-18 NOTE — Discharge Instructions (Signed)
 Take sucralfate 1 g--1 tablet 2 times daily for stomach acid  Continue your lansoprazole/Prevacid.  Please follow-up with your primary care about this issue  If these severe symptoms return, please go to the emergency room for further evaluation

## 2023-02-18 NOTE — ED Provider Notes (Signed)
 [facility]    CSN: [id number] Arrival date & time: 02/18/23  1422      History   Chief Complaint Chief Complaint  Patient presents with   Abdominal Pain    Severe GERD also - Entered by patient    HPI Brendan [name] is a 73 y.o. male.    Abdominal Pain Here for rather severe epigastric pain that began last evening, about 1 hour after eating chicken nuggets and green beans.  It lasted all night and into this morning.  About 1230 this afternoon, about 2 hours ago, he vomited up a large amount of bilious material and then his pain was much improved.  He now just feels sore in his epigastric area.  No fever or chills and no diarrhea.  Last bowel movement was on January 12, and it was normal  While he was having the pain last evening he did also experience some sour waterbrash and reflux.  He does take Prevacid  for acid reflux  No blood in the emesis, and he did not vomit any food material when he vomited this afternoon.  Past Medical History:  Diagnosis Date   Cancer [facility])    prostate.  skin basal cell   Decreased hearing    ED (erectile dysfunction)    GERD (gastroesophageal reflux disease)    Heart murmur    Hyperlipidemia    IBS (irritable bowel syndrome)    Incidental pulmonary nodule, > 3mm and < 8mm 07/02/2018   Right lung - needs f/u CT 6 months   MVP (mitral valve prolapse)    Nonrheumatic mitral valve regurgitation    Ruptured cervical disc 2008   S/P minimally invasive mitral valve repair 07/08/2018   Complex valvuloplasty including triangular resection of posterior leaflet, artificial Gore-tex neochord placement x6 and 30 mm Sorin Memo 4D ring annuloplasty via right mini thoracotomy approach    Patient Active Problem List   Diagnosis Date Noted   Nasal congestion 01/20/2022   Atherosclerosis of aorta (HCC) 09/14/2020   Sinusitis 06/25/2020   Absolute anemia 09/24/2018   Anticoagulated 09/24/2018   Long term (current) use of anticoagulants  07/16/2018   S/P minimally invasive mitral valve repair 07/08/2018   Incidental pulmonary nodule, > 3mm and < 8mm 07/02/2018   Tick bite 06/25/2018   Nonrheumatic mitral valve regurgitation    Heart murmur 03/02/2018   Leg pain, inferior, right 03/02/2018   External otitis of left ear 07/15/2017   Viral URI 03/29/2015   Cellulitis of left buttock 01/18/2013   Hearing loss of aging 08/03/2012   Well adult exam 09/02/2011   Acute conjunctivitis, left eye 07/16/2011   FEVER UNSPECIFIED 04/11/2009   Cough 04/11/2009   Dyslipidemia 07/28/2007   Allergic rhinitis 07/28/2007   GERD 07/28/2007   ERECTILE DYSFUNCTION 07/28/2007   PROSTATE CANCER, HX OF 07/28/2007   SKIN CANCER, HX OF 07/28/2007    Past Surgical History:  Procedure Laterality Date   ACHILLES TENDON SURGERY  1980   prosthesis   AMPUTATION FINGER / THUMB Right 12/2004   tip of thumb for strep infection   EYE SURGERY     MITRAL VALVE REPAIR Right 07/08/2018   Procedure: MINIMALLY INVASIVE MITRAL VALVE REPAIR (MVR);  Surgeon: Dusty Sudie DEL, MD;  Location: [facility] OR;  Service: Open Heart Surgery;  Laterality: Right;  GLUTARALDEHYDE   PROSTATECTOMY  2007   RIGHT/LEFT HEART CATH AND CORONARY ANGIOGRAPHY N/A 06/17/2018   Procedure: RIGHT/LEFT HEART CATH AND CORONARY ANGIOGRAPHY;  Surgeon: Wonda Sharper,  MD;  Location: [facility] INVASIVE CV LAB;  Service: Cardiovascular;  Laterality: N/A;   TEE WITHOUT CARDIOVERSION N/A 04/06/2018   Procedure: TRANSESOPHAGEAL ECHOCARDIOGRAM (TEE);  Surgeon: Francyne Headland, MD;  Location: [facility] ENDOSCOPY;  Service: Cardiovascular;  Laterality: N/A;   TEE WITHOUT CARDIOVERSION N/A 07/08/2018   Procedure: TRANSESOPHAGEAL ECHOCARDIOGRAM (TEE);  Surgeon: Dusty Sudie DEL, MD;  Location: [facility] OR;  Service: Open Heart Surgery;  Laterality: N/A;   TONSILLECTOMY         Home Medications    Prior to Admission medications   Medication Sig Start Date End Date Taking? Authorizing Provider  sucralfate  (CARAFATE ) 1 g  tablet Take 1 tablet (1 g total) by mouth 2 (two) times daily. 02/18/23  Yes Vonna Sharlet POUR, MD  acetaminophen  (TYLENOL ) 500 MG tablet Take 1 tablet (500 mg total) by mouth every 6 (six) hours as needed (pain.). 07/28/19   Plotnikov, Aleksei V, MD  aspirin  EC 81 MG EC tablet Take 1 tablet (81 mg total) by mouth daily. 07/13/18   Roddenberry, Myron G, PA-C  atorvastatin  (LIPITOR) 20 MG tablet Take 1 tablet (20 mg total) by mouth daily. 01/13/23 04/13/23  Lavona Agent, MD  chlorpheniramine (CHLOR-TRIMETON) 4 MG tablet Take 4 mg by mouth daily.    [provider]  Cholecalciferol (VITAMIN D3) 50 MCG (2000 UT) TABS 2,000 Units.  09/05/18   [provider]  hydrocortisone 2.5 % cream Apply 1 application topically daily as needed. 10/24/18   [provider]  hydroxychloroquine  (PLAQUENIL ) 200 MG tablet Take 400 mg bid x 2 days, then 200 mg daily for 5 days 09/24/22   Plotnikov, Karlynn GAILS, MD  lansoprazole  (PREVACID ) 30 MG capsule Take 1 capsule (30 mg total) by mouth daily before breakfast. 09/24/22   Plotnikov, Aleksei V, MD  metoprolol  tartrate (LOPRESSOR ) 25 MG tablet Take 0.5 tablets (12.5 mg total) by mouth daily. 09/24/22   Plotnikov, Karlynn GAILS, MD    Family History Family History  Problem Relation Age of Onset   Heart disease Mother        arrhythmia, angina   Heart disease Father 9       CAD, MVR   Hyperlipidemia Other     Social History Social History   Tobacco Use   Smoking status: Never   Smokeless tobacco: Never  Vaping Use   Vaping status: Never Used  Substance Use Topics   Alcohol use: No    Comment: occasionally   Drug use: No     Allergies   Hydrocodone   Review of Systems Review of Systems  Gastrointestinal:  Positive for abdominal pain.     Physical Exam Triage Vital Signs ED Triage Vitals  Encounter Vitals Group     BP 02/18/23 1440 129/85     Systolic BP Percentile --      Diastolic BP Percentile --      Pulse Rate 02/18/23  1440 91     Resp 02/18/23 1440 20     Temp 02/18/23 1440 97.8 F (36.6 C)     Temp Source 02/18/23 1440 Oral     SpO2 02/18/23 1440 94 %     Weight 02/18/23 1442 170 lb (77.1 kg)     Height --      Head Circumference --      Peak Flow --      Pain Score 02/18/23 1442 0     Pain Loc --      Pain Education --      Exclude  from Growth Chart --    No data found.  Updated Vital Signs BP 129/85 (BP Location: Right Arm)   Pulse 91   Temp 97.8 F (36.6 C) (Oral)   Resp 20   Wt 77.1 kg   SpO2 94%   BMI 25.10 kg/m   Visual Acuity Right Eye Distance:   Left Eye Distance:   Bilateral Distance:    Right Eye Near:   Left Eye Near:    Bilateral Near:     Physical Exam Vitals reviewed.  Constitutional:      General: He is not in acute distress.    Appearance: He is not ill-appearing, toxic-appearing or diaphoretic.  HENT:     Nose: Nose normal.     Mouth/Throat:     Mouth: Mucous membranes are moist.  Eyes:     Extraocular Movements: Extraocular movements intact.     Conjunctiva/sclera: Conjunctivae normal.     Pupils: Pupils are equal, round, and reactive to light.  Cardiovascular:     Rate and Rhythm: Normal rate and regular rhythm.     Heart sounds: No murmur heard. Pulmonary:     Effort: Pulmonary effort is normal.     Breath sounds: Normal breath sounds. No stridor. No wheezing, rhonchi or rales.  Abdominal:     General: There is no distension.     Palpations: Abdomen is soft.     Comments: Bowel sounds are normal.  There is very mild tenderness in the epigastrium.  Musculoskeletal:     Cervical back: Neck supple.  Lymphadenopathy:     Cervical: No cervical adenopathy.  Skin:    Coloration: Skin is not jaundiced or pale.  Neurological:     General: No focal deficit present.     Mental Status: He is alert and oriented to person, place, and time.  Psychiatric:        Behavior: Behavior normal.      UC Treatments / Results  Labs (all labs ordered are  listed, but only abnormal results are displayed) Labs Reviewed - No data to display  EKG   Radiology No results found.  Procedures Procedures (including critical care time)  Medications Ordered in UC Medications - No data to display  Initial Impression / Assessment and Plan / UC Course  I have reviewed the triage vital signs and the nursing notes.  Pertinent labs & imaging results that were available during my care of the patient were reviewed by me and considered in my medical decision making (see chart for details).     I discussed with him that as this may be esophageal spasm he is just experience.  Carafate  is sent in to help in his acid reflux treatment.  I do think it is low likelihood that this has anything to do with his heart, since the emesis this afternoon gave him such relief.  I have asked him to follow-up with his primary care.  If symptoms return and are severe like they were, he will go to the emergency room for further evaluation Final Clinical Impressions(s) / UC Diagnoses   Final diagnoses:  Abdominal pain, epigastric   Discharge Instructions   None    ED Prescriptions     Medication Sig Dispense Auth. Provider   sucralfate  (CARAFATE ) 1 g tablet Take 1 tablet (1 g total) by mouth 2 (two) times daily. 60 tablet Aleena Kirkeby, Sharlet POUR, MD      PDMP not reviewed this encounter.   Vonna Sharlet POUR, MD 02/18/23 [phone]

## 2023-02-20 ENCOUNTER — Ambulatory Visit (HOSPITAL_COMMUNITY): Payer: Medicare Other | Attending: Cardiology

## 2023-02-20 DIAGNOSIS — I340 Nonrheumatic mitral (valve) insufficiency: Secondary | ICD-10-CM | POA: Insufficient documentation

## 2023-02-20 LAB — ECHOCARDIOGRAM COMPLETE
Area-P 1/2: 2.9 cm2
MV M vel: 5.25 m/s
MV Peak grad: 110.3 mm[Hg]
MV VTI: 0.93 cm2
Radius: 0.7 cm
S' Lateral: 3.3 cm

## 2023-02-24 ENCOUNTER — Encounter: Payer: Self-pay | Admitting: *Deleted

## 2023-03-06 ENCOUNTER — Ambulatory Visit: Payer: Medicare Other | Admitting: Internal Medicine

## 2023-03-06 ENCOUNTER — Encounter: Payer: Self-pay | Admitting: Internal Medicine

## 2023-03-06 VITALS — BP 110/70 | HR 80 | Temp 98.0°F | Ht 69.0 in | Wt 167.0 lb

## 2023-03-06 DIAGNOSIS — R1011 Right upper quadrant pain: Secondary | ICD-10-CM | POA: Diagnosis not present

## 2023-03-06 DIAGNOSIS — R112 Nausea with vomiting, unspecified: Secondary | ICD-10-CM | POA: Insufficient documentation

## 2023-03-06 DIAGNOSIS — E785 Hyperlipidemia, unspecified: Secondary | ICD-10-CM

## 2023-03-06 DIAGNOSIS — R1114 Bilious vomiting: Secondary | ICD-10-CM

## 2023-03-06 HISTORY — DX: Nausea with vomiting, unspecified: R11.2

## 2023-03-06 LAB — COMPREHENSIVE METABOLIC PANEL
ALT: 12 U/L (ref 0–53)
AST: 17 U/L (ref 0–37)
Albumin: 4.7 g/dL (ref 3.5–5.2)
Alkaline Phosphatase: 62 U/L (ref 39–117)
BUN: 14 mg/dL (ref 6–23)
CO2: 30 meq/L (ref 19–32)
Calcium: 9.5 mg/dL (ref 8.4–10.5)
Chloride: 102 meq/L (ref 96–112)
Creatinine, Ser: 1.13 mg/dL (ref 0.40–1.50)
GFR: 64.15 mL/min (ref >60.00)
Glucose, Bld: 98 mg/dL (ref 70–99)
Potassium: 4.3 meq/L (ref 3.5–5.1)
Sodium: 141 meq/L (ref 135–145)
Total Bilirubin: 1 mg/dL (ref 0.2–1.2)
Total Protein: 6.9 g/dL (ref 6.0–8.3)

## 2023-03-06 LAB — URINALYSIS, ROUTINE W REFLEX MICROSCOPIC
Hgb urine dipstick: NEGATIVE
Ketones, ur: 15 — AB
Leukocytes,Ua: NEGATIVE
Nitrite: NEGATIVE
Specific Gravity, Urine: 1.025 (ref 1.000–1.030)
Total Protein, Urine: 30 — AB
Urine Glucose: NEGATIVE
Urobilinogen, UA: 2 — AB (ref 0.0–1.0)
WBC, UA: NONE SEEN (ref 0–?)
pH: 6 (ref 5.0–8.0)

## 2023-03-06 LAB — CBC WITH DIFFERENTIAL/PLATELET
Basophils Absolute: 0 10*3/uL (ref 0.0–0.1)
Basophils Relative: 0.3 % (ref 0.0–3.0)
Eosinophils Absolute: 0 10*3/uL (ref 0.0–0.7)
Eosinophils Relative: 0.2 % (ref 0.0–5.0)
HCT: 48.1 % (ref 39.0–52.0)
Hemoglobin: 15.8 g/dL (ref 13.0–17.0)
Lymphocytes Relative: 8.5 % — ABNORMAL LOW (ref 12.0–46.0)
Lymphs Abs: 1 10*3/uL (ref 0.7–4.0)
MCHC: 32.9 g/dL (ref 30.0–36.0)
MCV: 89.8 fL (ref 78.0–100.0)
Monocytes Absolute: 0.4 10*3/uL (ref 0.1–1.0)
Monocytes Relative: 4 % (ref 3.0–12.0)
Neutro Abs: 9.9 10*3/uL — ABNORMAL HIGH (ref 1.4–7.7)
Neutrophils Relative %: 87 % — ABNORMAL HIGH (ref 43.0–77.0)
Platelets: 214 10*3/uL (ref 150.0–400.0)
RBC: 5.35 Mil/uL (ref 4.22–5.81)
RDW: 13.4 % (ref 11.5–15.5)
WBC: 11.3 10*3/uL — ABNORMAL HIGH (ref 4.0–10.5)

## 2023-03-06 LAB — SEDIMENTATION RATE: Sed Rate: 3 mm/h (ref 0–20)

## 2023-03-06 LAB — LIPASE: Lipase: 4 U/L — ABNORMAL LOW (ref 11.0–59.0)

## 2023-03-06 MED ORDER — ONDANSETRON HCL 4 MG PO TABS: 4.0000 mg | ORAL_TABLET | Freq: Three times a day (TID) | ORAL | 1 refills | Status: DC | PRN

## 2023-03-06 NOTE — Patient Instructions (Signed)
Do not take Lipitor

## 2023-03-06 NOTE — Assessment & Plan Note (Signed)
Stop Lipitor Labs  Abd [place] STAT Zofran Carafate

## 2023-03-06 NOTE — Assessment & Plan Note (Signed)
Do not take Lipitor

## 2023-03-06 NOTE — Progress Notes (Signed)
Subjective:  Patient ID: Brendan [name], male    DOB: 05/25/1950  Age: 73 y.o. MRN: [id number]  CC: Abdominal Pain (Pt states he has been having GI pain for a few weeks. Pt states he was vomiting a lot of green stuff this has stopped. Pt is nauseous and having acid reflux a/w stomach/mid upper abdomen pains. )   HPI Brendan [name] presents for epigastric Abdominal Pain (Pt states he has been having GI pain for a few weeks. Pt states he was vomiting a lot of green stuff this has stopped. Pt is nauseous and having acid reflux a/w stomach/mid upper abdomen pains. He had several large BMs. On Jan 13-14 the above happened.    He switched to Lipitor on 01/13/23 from Lovastatin.  Pt stopped Lipitor 2-3 d ago. Feeling better  Outpatient Medications Prior to Visit  Medication Sig Dispense Refill   acetaminophen (TYLENOL) 500 MG tablet Take 1 tablet (500 mg total) by mouth every 6 (six) hours as needed (pain.). 30 tablet 3   aspirin EC 81 MG EC tablet Take 1 tablet (81 mg total) by mouth daily.     atorvastatin (LIPITOR) 20 MG tablet Take 1 tablet (20 mg total) by mouth daily. 90 tablet 3   chlorpheniramine (CHLOR-TRIMETON) 4 MG tablet Take 4 mg by mouth daily.     Cholecalciferol (VITAMIN D3) 50 MCG (2000 UT) TABS 2,000 Units.      hydrocortisone 2.5 % cream Apply 1 application topically daily as needed.     lansoprazole (PREVACID) 30 MG capsule Take 1 capsule (30 mg total) by mouth daily before breakfast. 90 capsule 3   metoprolol tartrate (LOPRESSOR) 25 MG tablet Take 0.5 tablets (12.5 mg total) by mouth daily. 45 tablet 3   sucralfate (CARAFATE) 1 g tablet Take 1 tablet (1 g total) by mouth 2 (two) times daily. 60 tablet 0   hydroxychloroquine (PLAQUENIL) 200 MG tablet Take 400 mg bid x 2 days, then 200 mg daily for 5 days 16 tablet 0   No facility-administered medications prior to visit.    ROS: Review of Systems  Constitutional:  Positive for fatigue. Negative for appetite change and  unexpected weight change.  HENT:  Negative for congestion, nosebleeds, sneezing, sore throat and trouble swallowing.   Eyes:  Negative for itching and visual disturbance.  Respiratory:  Negative for cough.   Cardiovascular:  Negative for chest pain, palpitations and leg swelling.  Gastrointestinal:  Positive for abdominal pain, diarrhea, nausea and vomiting. Negative for abdominal distention and blood in stool.  Genitourinary:  Negative for frequency and hematuria.  Musculoskeletal:  Negative for back pain, gait problem, joint swelling and neck pain.  Skin:  Negative for rash.  Neurological:  Negative for dizziness, tremors, speech difficulty and weakness.  Psychiatric/Behavioral:  Negative for agitation, dysphoric mood and sleep disturbance. The patient is not nervous/anxious.     Objective:  BP 110/70 (BP Location: Left Arm, Patient Position: Sitting, Cuff Size: Normal)   Pulse 80   Temp 98 F (36.7 C) (Oral)   Ht 5\' 9"  (1.753 m)   Wt 167 lb (75.8 kg)   SpO2 99%   BMI 24.66 kg/m   BP Readings from Last 3 Encounters:  03/06/23 110/70  02/18/23 129/85  01/13/23 (!) 142/82    Wt Readings from Last 3 Encounters:  03/06/23 167 lb (75.8 kg)  02/18/23 170 lb (77.1 kg)  01/13/23 173 lb (78.5 kg)    Physical Exam Constitutional:  General: He is not in acute distress.    Appearance: He is well-developed. He is not ill-appearing, toxic-appearing or diaphoretic.     Comments: NAD  HENT:     Head: Normocephalic and atraumatic.     Right Ear: External ear normal.     Left Ear: External ear normal.     Nose: Nose normal.     Mouth/Throat:     Pharynx: No oropharyngeal exudate.  Eyes:     General: No scleral icterus.       Right eye: No discharge.        Left eye: No discharge.     Conjunctiva/sclera: Conjunctivae normal.     Pupils: Pupils are equal, round, and reactive to light.  Neck:     Thyroid: No thyromegaly.     Vascular: No JVD.     Trachea: No tracheal  deviation.  Cardiovascular:     Rate and Rhythm: Normal rate and regular rhythm.     Heart sounds: Normal heart sounds. No murmur heard.    No friction rub. No gallop.  Pulmonary:     Effort: Pulmonary effort is normal. No respiratory distress.     Breath sounds: Normal breath sounds. No stridor. No wheezing or rales.  Chest:     Chest wall: No tenderness.  Abdominal:     General: Bowel sounds are normal. There is no distension.     Palpations: Abdomen is soft. There is no mass.     Tenderness: There is generalized abdominal tenderness and tenderness in the right upper quadrant. There is no guarding or rebound.  Genitourinary:    Penis: Normal. No tenderness.      Prostate: Normal.     Rectum: Normal. Guaiac result negative.  Musculoskeletal:        General: No tenderness. Normal range of motion.     Cervical back: Normal range of motion and neck supple.  Lymphadenopathy:     Cervical: No cervical adenopathy.  Skin:    General: Skin is warm and dry.     Coloration: Skin is not pale.     Findings: No erythema or rash.  Neurological:     Mental Status: He is alert and oriented to person, place, and time.     Cranial Nerves: No cranial nerve deficit.     Motor: No abnormal muscle tone.     Coordination: Coordination normal.     Gait: Gait normal.     Deep Tendon Reflexes: Reflexes are normal and symmetric. Reflexes normal.  Psychiatric:        Behavior: Behavior normal.        Thought Content: Thought content normal.        Judgment: Judgment normal.   Looks tired RUQ more tender than the rest of the abd  Lab Results  Component Value Date   WBC 5.5 09/24/2022   HGB 15.1 09/24/2022   HCT 46.6 09/24/2022   PLT 188.0 09/24/2022   GLUCOSE 92 09/24/2022   CHOL 190 09/24/2022   TRIG 86.0 09/24/2022   HDL 50.90 09/24/2022   LDLCALC 122 (H) 09/24/2022   ALT 11 09/24/2022   AST 15 09/24/2022   NA 142 09/24/2022   K 4.2 09/24/2022   CL 105 09/24/2022   CREATININE 1.11  09/24/2022   BUN 15 09/24/2022   CO2 30 09/24/2022   TSH 0.99 09/24/2022   PSA 0.01 (L) 09/24/2022   INR 2.1 10/05/2018   HGBA1C 5.1 07/03/2018    No results found.  Assessment & Plan:   Problem List Items Addressed This Visit     Dyslipidemia   Do not take Lipitor      RUQ abdominal pain - Primary   Relevant Orders   US Abdomen Complete   CBC with Differential/Platelet   Comprehensive metabolic panel   Lipase   Urinalysis   Sedimentation rate   Nausea & vomiting   Stop Lipitor Labs  Abd [place] STAT Zofran Carafate      Relevant Orders   US Abdomen Complete   CBC with Differential/Platelet   Comprehensive metabolic panel   Lipase   Urinalysis   Sedimentation rate      Meds ordered this encounter  Medications   ondansetron (ZOFRAN) 4 MG tablet    Sig: Take 1 tablet (4 mg total) by mouth every 8 (eight) hours as needed for vomiting or nausea.    Dispense:  20 tablet    Refill:  1      Follow-up: Return in about 1 week (around 03/13/2023) for a follow-up visit.  Sonda Primes, MD

## 2023-03-09 ENCOUNTER — Encounter: Payer: Self-pay | Admitting: Internal Medicine

## 2023-03-10 ENCOUNTER — Ambulatory Visit
Admission: RE | Admit: 2023-03-10 | Discharge: 2023-03-10 | Disposition: A | Discharge status: Home / Self Care | Payer: Medicare Other | Source: Ambulatory Visit | Attending: Internal Medicine | Admitting: Internal Medicine

## 2023-03-10 DIAGNOSIS — R1110 Vomiting, unspecified: Secondary | ICD-10-CM | POA: Diagnosis not present

## 2023-03-10 DIAGNOSIS — R1011 Right upper quadrant pain: Secondary | ICD-10-CM | POA: Diagnosis not present

## 2023-03-11 ENCOUNTER — Encounter: Payer: Self-pay | Admitting: Internal Medicine

## 2023-03-13 ENCOUNTER — Ambulatory Visit: Payer: Medicare Other | Admitting: Internal Medicine

## 2023-03-13 ENCOUNTER — Encounter: Payer: Self-pay | Admitting: Internal Medicine

## 2023-03-13 VITALS — BP 102/60 | HR 74 | Temp 98.4°F | Ht 69.0 in | Wt 164.0 lb

## 2023-03-13 DIAGNOSIS — I2583 Coronary atherosclerosis due to lipid rich plaque: Secondary | ICD-10-CM | POA: Diagnosis not present

## 2023-03-13 DIAGNOSIS — I2510 Atherosclerotic heart disease of native coronary artery without angina pectoris: Secondary | ICD-10-CM | POA: Insufficient documentation

## 2023-03-13 DIAGNOSIS — K5900 Constipation, unspecified: Secondary | ICD-10-CM | POA: Diagnosis not present

## 2023-03-13 DIAGNOSIS — E785 Hyperlipidemia, unspecified: Secondary | ICD-10-CM | POA: Diagnosis not present

## 2023-03-13 DIAGNOSIS — I340 Nonrheumatic mitral (valve) insufficiency: Secondary | ICD-10-CM

## 2023-03-13 MED ORDER — POLYETHYLENE GLYCOL 3350 17 GM/SCOOP PO POWD: 17.0000 g | Freq: Two times a day (BID) | ORAL | Status: DC | PRN

## 2023-03-13 MED ORDER — ICOSAPENT ETHYL 1 G PO CAPS: 2.0000 g | ORAL_CAPSULE | Freq: Two times a day (BID) | ORAL | 11 refills | Status: DC

## 2023-03-13 NOTE — Assessment & Plan Note (Signed)
 2020 Heart Cath: Widely patent coronary arteries with minimal irregularity of the mid LAD, otherwise angiographically normal vessels  On Lovastatin 

## 2023-03-13 NOTE — Assessment & Plan Note (Signed)
S/p MI MV Repair

## 2023-03-13 NOTE — Assessment & Plan Note (Signed)
 Lipitor - d/c  Lovastatin  - re-start

## 2023-03-13 NOTE — Progress Notes (Signed)
 Subjective:  Patient ID: Brendan [name], male    DOB: October 12, 1950  Age: 73 y.o. MRN: [id number]  CC: Medical Management of Chronic Issues (1 week f/u)   HPI Netanel C Mussell presents for n/v - doing well     Outpatient Medications Prior to Visit  Medication Sig Dispense Refill   acetaminophen  (TYLENOL ) 500 MG tablet Take 1 tablet (500 mg total) by mouth every 6 (six) hours as needed (pain.). 30 tablet 3   aspirin  EC 81 MG EC tablet Take 1 tablet (81 mg total) by mouth daily.     chlorpheniramine (CHLOR-TRIMETON) 4 MG tablet Take 4 mg by mouth daily.     Cholecalciferol (VITAMIN D3) 50 MCG (2000 UT) TABS 2,000 Units.      hydrocortisone 2.5 % cream Apply 1 application topically daily as needed.     lansoprazole  (PREVACID ) 30 MG capsule Take 1 capsule (30 mg total) by mouth daily before breakfast. 90 capsule 3   metoprolol  tartrate (LOPRESSOR ) 25 MG tablet Take 0.5 tablets (12.5 mg total) by mouth daily. 45 tablet 3   ondansetron  (ZOFRAN ) 4 MG tablet Take 1 tablet (4 mg total) by mouth every 8 (eight) hours as needed for vomiting or nausea. 20 tablet 1   sucralfate  (CARAFATE ) 1 g tablet Take 1 tablet (1 g total) by mouth 2 (two) times daily. 60 tablet 0   atorvastatin  (LIPITOR) 20 MG tablet Take 1 tablet (20 mg total) by mouth daily. 90 tablet 3   No facility-administered medications prior to visit.    ROS: Review of Systems  Constitutional:  Negative for appetite change, fatigue and unexpected weight change.  HENT:  Negative for congestion, nosebleeds, sneezing, sore throat and trouble swallowing.   Eyes:  Negative for itching and visual disturbance.  Respiratory:  Negative for cough.   Cardiovascular:  Negative for chest pain, palpitations and leg swelling.  Gastrointestinal:  Negative for abdominal distention, blood in stool, diarrhea and nausea.  Genitourinary:  Negative for frequency and hematuria.  Musculoskeletal:  Negative for back pain, gait problem, joint swelling and  neck pain.  Skin:  Negative for rash.  Neurological:  Negative for dizziness, tremors, speech difficulty and weakness.  Psychiatric/Behavioral:  Negative for agitation, dysphoric mood and sleep disturbance. The patient is not nervous/anxious.     Objective:  BP 102/60 (BP Location: Left Arm, Patient Position: Sitting, Cuff Size: Normal)   Pulse 74   Temp 98.4 F (36.9 C) (Oral)   Ht 5' 9 (1.753 m)   Wt 164 lb (74.4 kg)   SpO2 96%   BMI 24.22 kg/m   BP Readings from Last 3 Encounters:  03/13/23 102/60  03/06/23 110/70  02/18/23 129/85    Wt Readings from Last 3 Encounters:  03/13/23 164 lb (74.4 kg)  03/06/23 167 lb (75.8 kg)  02/18/23 170 lb (77.1 kg)    Physical Exam Constitutional:      General: He is not in acute distress.    Appearance: He is well-developed.     Comments: NAD  Eyes:     Conjunctiva/sclera: Conjunctivae normal.     Pupils: Pupils are equal, round, and reactive to light.  Neck:     Thyroid : No thyromegaly.     Vascular: No JVD.  Cardiovascular:     Rate and Rhythm: Normal rate and regular rhythm.     Heart sounds: Normal heart sounds. No murmur heard.    No friction rub. No gallop.  Pulmonary:     Effort: Pulmonary  effort is normal. No respiratory distress.     Breath sounds: Normal breath sounds. No wheezing or rales.  Chest:     Chest wall: No tenderness.  Abdominal:     General: Bowel sounds are normal. There is no distension.     Palpations: Abdomen is soft. There is no mass.     Tenderness: There is no abdominal tenderness. There is no guarding or rebound.  Musculoskeletal:        General: No tenderness. Normal range of motion.     Cervical back: Normal range of motion.  Lymphadenopathy:     Cervical: No cervical adenopathy.  Skin:    General: Skin is warm and dry.     Findings: No rash.  Neurological:     Mental Status: He is alert and oriented to person, place, and time.     Cranial Nerves: No cranial nerve deficit.     Motor:  No abnormal muscle tone.     Coordination: Coordination normal.     Gait: Gait normal.     Deep Tendon Reflexes: Reflexes are normal and symmetric.  Psychiatric:        Behavior: Behavior normal.        Thought Content: Thought content normal.        Judgment: Judgment normal.     Lab Results  Component Value Date   WBC 11.3 (H) 03/06/2023   HGB 15.8 03/06/2023   HCT 48.1 03/06/2023   PLT 214.0 03/06/2023   GLUCOSE 98 03/06/2023   CHOL 190 09/24/2022   TRIG 86.0 09/24/2022   HDL 50.90 09/24/2022   LDLCALC 122 (H) 09/24/2022   ALT 12 03/06/2023   AST 17 03/06/2023   NA 141 03/06/2023   K 4.3 03/06/2023   CL 102 03/06/2023   CREATININE 1.13 03/06/2023   BUN 14 03/06/2023   CO2 30 03/06/2023   TSH 0.99 09/24/2022   PSA 0.01 (L) 09/24/2022   INR 2.1 10/05/2018   HGBA1C 5.1 07/03/2018    US  Abdomen Complete Addendum Date: 03/10/2023 ADDENDUM REPORT: 03/10/2023 10:42 COMPARISON:  Chest CT 01/27/2020 Electronically Signed   By: Franky Crease M.D.   On: 03/10/2023 10:42   Result Date: 03/10/2023 CLINICAL DATA:  Right upper quadrant pain, vomiting EXAM: ABDOMEN ULTRASOUND COMPLETE COMPARISON:  None Available. FINDINGS: Gallbladder: No gallstones or wall thickening visualized. No sonographic Murphy sign noted by sonographer. Common bile duct: Diameter: Normal caliber, 3 mm Liver: 1.8 cm hypoechoic area in the right hepatic dome. This measured up to 2.4 cm on prior chest CT from 2021 compatible with a benign cyst. Diffusely heterogeneous, mildly increased echotexture may reflect fatty infiltration. No biliary ductal dilatation. Portal vein is patent on color Doppler imaging with normal direction of blood flow towards the liver. IVC: No abnormality visualized. Pancreas: Visualized portion unremarkable. Spleen: Size and appearance within normal limits. Right Kidney: Length: 9.0 cm. Echogenicity within normal limits. No mass or hydronephrosis visualized. Left Kidney: Length: 10.8 cm.  Echogenicity within normal limits. No mass or hydronephrosis visualized. Abdominal aorta: No aneurysm visualized. Other findings: None. IMPRESSION: No acute findings.  No evidence of cholelithiasis or cholecystitis. Suspect mild fatty infiltration of the liver. Electronically Signed: By: Franky Crease M.D. On: 03/10/2023 10:39    Assessment & Plan:   Problem List Items Addressed This Visit     Nonrheumatic mitral valve regurgitation (Chronic)    S/p MI MV Repair       Dyslipidemia - Primary   Lipitor - d/c  Lovastatin  - re-start      Coronary atherosclerosis   2020 Heart Cath: Widely patent coronary arteries with minimal irregularity of the mid LAD, otherwise angiographically normal vessels  On Lovastatin       Constipation      Meds ordered this encounter  Medications   polyethylene glycol powder (GLYCOLAX /MIRALAX ) 17 GM/SCOOP powder    Sig: Take 17-34 g by mouth 2 (two) times daily as needed.      Follow-up: Return in about 2 months (around 05/11/2023) for a follow-up visit.  Marolyn Noel, MD

## 2023-04-14 DIAGNOSIS — L570 Actinic keratosis: Secondary | ICD-10-CM | POA: Diagnosis not present

## 2023-04-14 DIAGNOSIS — D225 Melanocytic nevi of trunk: Secondary | ICD-10-CM | POA: Diagnosis not present

## 2023-04-14 DIAGNOSIS — Z85828 Personal history of other malignant neoplasm of skin: Secondary | ICD-10-CM | POA: Diagnosis not present

## 2023-04-14 DIAGNOSIS — L821 Other seborrheic keratosis: Secondary | ICD-10-CM | POA: Diagnosis not present

## 2023-04-16 DIAGNOSIS — E785 Hyperlipidemia, unspecified: Secondary | ICD-10-CM | POA: Diagnosis not present

## 2023-04-17 LAB — LIPID PANEL
Chol/HDL Ratio: 3.7 ratio (ref 0.0–5.0)
Cholesterol, Total: 207 mg/dL — ABNORMAL HIGH (ref 100–199)
HDL: 56 mg/dL (ref >39)
LDL Chol Calc (NIH): 137 mg/dL — ABNORMAL HIGH (ref 0–99)
Triglycerides: 77 mg/dL (ref 0–149)
VLDL Cholesterol Cal: 14 mg/dL (ref 5–40)

## 2023-04-17 LAB — LIPOPROTEIN A (LPA): Lipoprotein (a): 59.6 nmol/L (ref <75.0)

## 2023-04-21 ENCOUNTER — Encounter: Payer: Self-pay | Admitting: *Deleted

## 2023-05-12 ENCOUNTER — Encounter: Payer: Self-pay | Admitting: Internal Medicine

## 2023-05-12 ENCOUNTER — Ambulatory Visit (INDEPENDENT_AMBULATORY_CARE_PROVIDER_SITE_OTHER): Payer: Medicare Other | Admitting: Internal Medicine

## 2023-05-12 VITALS — BP 118/76 | HR 70 | Temp 98.0°F | Ht 69.0 in | Wt 162.8 lb

## 2023-05-12 DIAGNOSIS — E785 Hyperlipidemia, unspecified: Secondary | ICD-10-CM | POA: Diagnosis not present

## 2023-05-12 DIAGNOSIS — K5900 Constipation, unspecified: Secondary | ICD-10-CM

## 2023-05-12 DIAGNOSIS — R1114 Bilious vomiting: Secondary | ICD-10-CM | POA: Diagnosis not present

## 2023-05-12 DIAGNOSIS — J329 Chronic sinusitis, unspecified: Secondary | ICD-10-CM | POA: Diagnosis not present

## 2023-05-12 LAB — LIPID PANEL
Cholesterol: 171 mg/dL (ref 0–200)
HDL: 48.2 mg/dL (ref >39.00)
LDL Cholesterol: 107 mg/dL — ABNORMAL HIGH (ref 0–99)
NonHDL: 122.38
Total CHOL/HDL Ratio: 4
Triglycerides: 75 mg/dL (ref 0.0–149.0)
VLDL: 15 mg/dL (ref 0.0–40.0)

## 2023-05-12 LAB — COMPREHENSIVE METABOLIC PANEL WITH GFR
ALT: 15 U/L (ref 0–53)
AST: 19 U/L (ref 0–37)
Albumin: 4.6 g/dL (ref 3.5–5.2)
Alkaline Phosphatase: 79 U/L (ref 39–117)
BUN: 15 mg/dL (ref 6–23)
CO2: 31 meq/L (ref 19–32)
Calcium: 9.4 mg/dL (ref 8.4–10.5)
Chloride: 103 meq/L (ref 96–112)
Creatinine, Ser: 1.06 mg/dL (ref 0.40–1.50)
GFR: 69.18 mL/min
Glucose, Bld: 89 mg/dL (ref 70–99)
Potassium: 4.1 meq/L (ref 3.5–5.1)
Sodium: 139 meq/L (ref 135–145)
Total Bilirubin: 0.9 mg/dL (ref 0.2–1.2)
Total Protein: 6.9 g/dL (ref 6.0–8.3)

## 2023-05-12 MED ORDER — LOVASTATIN 20 MG PO TABS: 20.0000 mg | ORAL_TABLET | Freq: Every day | ORAL | 3 refills | Status: DC

## 2023-05-12 MED ORDER — AMOXICILLIN-POT CLAVULANATE 875-125 MG PO TABS: 1.0000 | ORAL_TABLET | Freq: Two times a day (BID) | ORAL | 0 refills | Status: DC

## 2023-05-12 NOTE — Assessment & Plan Note (Signed)
 Resolved

## 2023-05-12 NOTE — Assessment & Plan Note (Addendum)
 New Augmentin po Probiotic

## 2023-05-12 NOTE — Assessment & Plan Note (Signed)
No relapse 

## 2023-05-12 NOTE — Assessment & Plan Note (Signed)
 Nausea, vomiting due to Lipitor - resolved off of it. He can take Lovastatin ok -- will continue Cont w/Lovaza

## 2023-05-12 NOTE — Progress Notes (Signed)
 Subjective:  Patient ID: Brendan [name], male    DOB: 21-Jun-1950  Age: 73 y.o. MRN: [id number]  CC: Medical Management of Chronic Issues (2 Month Follow Up. Notes abdominally issues have been better. Patient had been taking the Omega 3 fish oil, experiencing constipation, cholesterol was still showing high on last set of labs. Patient is back taking the lovastatin) and URI (Patient notes of URI symptoms since last Thursday, nasal congestion, ear fullness, productive cough (green/brown phlegm), throat pain, chest pain with cough. Not currently using any OTC medication for this)   HPI Brendan [name] presents for 2 Month Follow Up. Notes abdominally issues have been better. Patient had been taking the Omega 3 fish oil, experiencing constipation, cholesterol was still showing high on last set of labs. Patient is back taking the lovastatin) and URI (Patient notes of URI symptoms since last Thursday, nasal congestion, ear fullness, productive cough (green/brown phlegm), throat pain, chest pain with cough. Not currently using any OTC medication for this).  Back on Lovastatin - no issues Outpatient Medications Prior to Visit  Medication Sig Dispense Refill   acetaminophen (TYLENOL) 500 MG tablet Take 1 tablet (500 mg total) by mouth every 6 (six) hours as needed (pain.). 30 tablet 3   aspirin EC 81 MG EC tablet Take 1 tablet (81 mg total) by mouth daily.     chlorpheniramine (CHLOR-TRIMETON) 4 MG tablet Take 4 mg by mouth daily.     Cholecalciferol (VITAMIN D3) 50 MCG (2000 UT) TABS 2,000 Units.      hydrocortisone 2.5 % cream Apply 1 application topically daily as needed.     icosapent Ethyl (VASCEPA) 1 g capsule Take 2 capsules (2 g total) by mouth 2 (two) times daily. 120 capsule 11   lansoprazole (PREVACID) 30 MG capsule Take 1 capsule (30 mg total) by mouth daily before breakfast. 90 capsule 3   metoprolol tartrate (LOPRESSOR) 25 MG tablet Take 0.5 tablets (12.5 mg total) by mouth daily. 45  tablet 3   ondansetron (ZOFRAN) 4 MG tablet Take 1 tablet (4 mg total) by mouth every 8 (eight) hours as needed for vomiting or nausea. 20 tablet 1   polyethylene glycol powder (GLYCOLAX/MIRALAX) 17 GM/SCOOP powder Take 17-34 g by mouth 2 (two) times daily as needed.     sucralfate (CARAFATE) 1 g tablet Take 1 tablet (1 g total) by mouth 2 (two) times daily. 60 tablet 0   No facility-administered medications prior to visit.    ROS: Review of Systems  Constitutional:  Negative for appetite change, fatigue and unexpected weight change.  HENT:  Positive for congestion, postnasal drip, sinus pain, sore throat and voice change. Negative for nosebleeds, sneezing and trouble swallowing.   Eyes:  Negative for itching and visual disturbance.  Respiratory:  Negative for cough.   Cardiovascular:  Negative for chest pain, palpitations and leg swelling.  Gastrointestinal:  Positive for constipation. Negative for abdominal distention, abdominal pain, blood in stool, diarrhea, nausea and vomiting.  Genitourinary:  Negative for frequency and hematuria.  Musculoskeletal:  Negative for back pain, gait problem, joint swelling and neck pain.  Skin:  Negative for rash.  Neurological:  Negative for dizziness, tremors, speech difficulty and weakness.  Psychiatric/Behavioral:  Negative for agitation, dysphoric mood and sleep disturbance. The patient is not nervous/anxious.     Objective:  BP 118/76   Pulse 70   Temp 98 F (36.7 C)   Ht 5\' 9"  (1.753 m)   Wt 162 lb 12.8 oz (  73.8 kg)   SpO2 96%   BMI 24.04 kg/m   BP Readings from Last 3 Encounters:  05/12/23 118/76  03/13/23 102/60  03/06/23 110/70    Wt Readings from Last 3 Encounters:  05/12/23 162 lb 12.8 oz (73.8 kg)  03/13/23 164 lb (74.4 kg)  03/06/23 167 lb (75.8 kg)    Physical Exam Constitutional:      General: He is not in acute distress.    Appearance: He is well-developed. He is not toxic-appearing.     Comments: NAD  HENT:      Mouth/Throat:     Pharynx: Posterior oropharyngeal erythema present.  Eyes:     Conjunctiva/sclera: Conjunctivae normal.     Pupils: Pupils are equal, round, and reactive to light.  Neck:     Thyroid: No thyromegaly.     Vascular: No JVD.  Cardiovascular:     Rate and Rhythm: Normal rate and regular rhythm.     Heart sounds: Normal heart sounds. No murmur heard.    No friction rub. No gallop.  Pulmonary:     Effort: Pulmonary effort is normal. No respiratory distress.     Breath sounds: Normal breath sounds. No wheezing or rales.  Chest:     Chest wall: No tenderness.  Abdominal:     General: Bowel sounds are normal. There is no distension.     Palpations: Abdomen is soft. There is no mass.     Tenderness: There is no abdominal tenderness. There is no guarding or rebound.  Musculoskeletal:        General: No tenderness. Normal range of motion.     Cervical back: Normal range of motion.  Lymphadenopathy:     Cervical: No cervical adenopathy.  Skin:    General: Skin is warm and dry.     Findings: No rash.  Neurological:     Mental Status: He is alert and oriented to person, place, and time.     Cranial Nerves: No cranial nerve deficit.     Motor: No abnormal muscle tone.     Coordination: Coordination normal.     Gait: Gait normal.     Deep Tendon Reflexes: Reflexes are normal and symmetric.  Psychiatric:        Behavior: Behavior normal.        Thought Content: Thought content normal.        Judgment: Judgment normal.   Hoarse Eryth throat  Lab Results  Component Value Date   WBC 11.3 (H) 03/06/2023   HGB 15.8 03/06/2023   HCT 48.1 03/06/2023   PLT 214.0 03/06/2023   GLUCOSE 98 03/06/2023   CHOL 207 (H) 04/16/2023   TRIG 77 04/16/2023   HDL 56 04/16/2023   LDLCALC 137 (H) 04/16/2023   ALT 12 03/06/2023   AST 17 03/06/2023   NA 141 03/06/2023   K 4.3 03/06/2023   CL 102 03/06/2023   CREATININE 1.13 03/06/2023   BUN 14 03/06/2023   CO2 30 03/06/2023   TSH  0.99 09/24/2022   PSA 0.01 (L) 09/24/2022   INR 2.1 10/05/2018   HGBA1C 5.1 07/03/2018    US Abdomen Complete Addendum Date: 03/10/2023 ADDENDUM REPORT: 03/10/2023 10:42 COMPARISON:  Chest CT 01/27/2020 Electronically Signed   By: Charlett Nose M.D.   On: 03/10/2023 10:42   Result Date: 03/10/2023 CLINICAL DATA:  Right upper quadrant pain, vomiting EXAM: ABDOMEN ULTRASOUND COMPLETE COMPARISON:  None Available. FINDINGS: Gallbladder: No gallstones or wall thickening visualized. No sonographic Murphy sign noted  by sonographer. Common bile duct: Diameter: Normal caliber, 3 mm Liver: 1.8 cm hypoechoic area in the right hepatic dome. This measured up to 2.4 cm on prior chest CT from 2021 compatible with a benign cyst. Diffusely heterogeneous, mildly increased echotexture may reflect fatty infiltration. No biliary ductal dilatation. Portal vein is patent on color Doppler imaging with normal direction of blood flow towards the liver. IVC: No abnormality visualized. Pancreas: Visualized portion unremarkable. Spleen: Size and appearance within normal limits. Right Kidney: Length: 9.0 cm. Echogenicity within normal limits. No mass or hydronephrosis visualized. Left Kidney: Length: 10.8 cm. Echogenicity within normal limits. No mass or hydronephrosis visualized. Abdominal aorta: No aneurysm visualized. Other findings: None. IMPRESSION: No acute findings.  No evidence of cholelithiasis or cholecystitis. Suspect mild fatty infiltration of the liver. Electronically Signed: By: Charlett Nose M.D. On: 03/10/2023 10:39    Assessment & Plan:   Problem List Items Addressed This Visit     Dyslipidemia - Primary   Nausea, vomiting due to Lipitor - resolved off of it. He can take Lovastatin ok -- will continue Cont w/Lovaza      Relevant Medications   lovastatin (MEVACOR) 20 MG tablet   Other Relevant Orders   Comprehensive metabolic panel with GFR   Lipid panel   Sinusitis   New Augmentin po      Relevant  Medications   amoxicillin-clavulanate (AUGMENTIN) 875-125 MG tablet   Nausea & vomiting   No relapse       Constipation   Resolved         Meds ordered this encounter  Medications   lovastatin (MEVACOR) 20 MG tablet    Sig: Take 1 tablet (20 mg total) by mouth at bedtime.    Dispense:  90 tablet    Refill:  3   amoxicillin-clavulanate (AUGMENTIN) 875-125 MG tablet    Sig: Take 1 tablet by mouth 2 (two) times daily.    Dispense:  20 tablet    Refill:  0      Follow-up: Return in about 4 months (around 09/11/2023) for a follow-up visit.  Sonda Primes, MD

## 2023-06-11 DIAGNOSIS — Z961 Presence of intraocular lens: Secondary | ICD-10-CM | POA: Diagnosis not present

## 2023-06-11 DIAGNOSIS — H04123 Dry eye syndrome of bilateral lacrimal glands: Secondary | ICD-10-CM | POA: Diagnosis not present

## 2023-09-29 ENCOUNTER — Encounter: Payer: Self-pay | Admitting: Internal Medicine

## 2023-09-29 ENCOUNTER — Ambulatory Visit (INDEPENDENT_AMBULATORY_CARE_PROVIDER_SITE_OTHER): Payer: Medicare Other | Admitting: Internal Medicine

## 2023-09-29 VITALS — BP 130/82 | HR 62 | Temp 98.0°F | Ht 69.0 in | Wt 163.0 lb

## 2023-09-29 DIAGNOSIS — Z1211 Encounter for screening for malignant neoplasm of colon: Secondary | ICD-10-CM

## 2023-09-29 DIAGNOSIS — Z8546 Personal history of malignant neoplasm of prostate: Secondary | ICD-10-CM | POA: Diagnosis not present

## 2023-09-29 DIAGNOSIS — I700 Atherosclerosis of aorta: Secondary | ICD-10-CM

## 2023-09-29 DIAGNOSIS — Z0000 Encounter for general adult medical examination without abnormal findings: Secondary | ICD-10-CM

## 2023-09-29 DIAGNOSIS — I2583 Coronary atherosclerosis due to lipid rich plaque: Secondary | ICD-10-CM | POA: Diagnosis not present

## 2023-09-29 LAB — CBC WITH DIFFERENTIAL/PLATELET
Basophils Absolute: 0 K/uL (ref 0.0–0.1)
Basophils Relative: 0.8 % (ref 0.0–3.0)
Eosinophils Absolute: 0.2 K/uL (ref 0.0–0.7)
Eosinophils Relative: 4.2 % (ref 0.0–5.0)
HCT: 44.5 % (ref 39.0–52.0)
Hemoglobin: 14.9 g/dL (ref 13.0–17.0)
Lymphocytes Relative: 25.7 % (ref 12.0–46.0)
Lymphs Abs: 1.4 K/uL (ref 0.7–4.0)
MCHC: 33.5 g/dL (ref 30.0–36.0)
MCV: 87.8 fl (ref 78.0–100.0)
Monocytes Absolute: 0.4 K/uL (ref 0.1–1.0)
Monocytes Relative: 7 % (ref 3.0–12.0)
Neutro Abs: 3.5 K/uL (ref 1.4–7.7)
Neutrophils Relative %: 62.3 % (ref 43.0–77.0)
Platelets: 196 K/uL (ref 150.0–400.0)
RBC: 5.07 Mil/uL (ref 4.22–5.81)
RDW: 13.6 % (ref 11.5–15.5)
WBC: 5.6 K/uL (ref 4.0–10.5)

## 2023-09-29 LAB — COMPREHENSIVE METABOLIC PANEL WITH GFR
ALT: 11 U/L (ref 0–53)
AST: 15 U/L (ref 0–37)
Albumin: 4.4 g/dL (ref 3.5–5.2)
Alkaline Phosphatase: 63 U/L (ref 39–117)
BUN: 13 mg/dL (ref 6–23)
CO2: 31 meq/L (ref 19–32)
Calcium: 9.2 mg/dL (ref 8.4–10.5)
Chloride: 103 meq/L (ref 96–112)
Creatinine, Ser: 1.01 mg/dL (ref 0.40–1.50)
GFR: 73.11 mL/min (ref >60.00)
Glucose, Bld: 88 mg/dL (ref 70–99)
Potassium: 4.3 meq/L (ref 3.5–5.1)
Sodium: 144 meq/L (ref 135–145)
Total Bilirubin: 0.9 mg/dL (ref 0.2–1.2)
Total Protein: 6.5 g/dL (ref 6.0–8.3)

## 2023-09-29 LAB — LIPID PANEL
Cholesterol: 174 mg/dL (ref 0–200)
HDL: 53.3 mg/dL (ref >39.00)
LDL Cholesterol: 96 mg/dL (ref 0–99)
NonHDL: 120.95
Total CHOL/HDL Ratio: 3
Triglycerides: 127 mg/dL (ref 0.0–149.0)
VLDL: 25.4 mg/dL (ref 0.0–40.0)

## 2023-09-29 LAB — URINALYSIS
Bilirubin Urine: NEGATIVE
Hgb urine dipstick: NEGATIVE
Ketones, ur: NEGATIVE
Leukocytes,Ua: NEGATIVE
Nitrite: NEGATIVE
Specific Gravity, Urine: 1.02 (ref 1.000–1.030)
Total Protein, Urine: NEGATIVE
Urine Glucose: NEGATIVE
Urobilinogen, UA: 1 (ref 0.0–1.0)
pH: 6 (ref 5.0–8.0)

## 2023-09-29 LAB — PSA: PSA: 0.01 ng/mL — ABNORMAL LOW (ref 0.10–4.00)

## 2023-09-29 LAB — TSH: TSH: 1.06 u[IU]/mL (ref 0.35–5.50)

## 2023-09-29 MED ORDER — METOPROLOL TARTRATE 25 MG PO TABS: 12.5000 mg | ORAL_TABLET | Freq: Every day | ORAL | 3 refills | Status: AC

## 2023-09-29 MED ORDER — LANSOPRAZOLE 30 MG PO CPDR: 30.0000 mg | DELAYED_RELEASE_CAPSULE | Freq: Every day | ORAL | 3 refills | Status: DC

## 2023-09-29 MED ORDER — LOVASTATIN 20 MG PO TABS: 20.0000 mg | ORAL_TABLET | Freq: Every day | ORAL | 3 refills | Status: DC

## 2023-09-29 MED ORDER — ICOSAPENT ETHYL 1 G PO CAPS: 2.0000 g | ORAL_CAPSULE | Freq: Two times a day (BID) | ORAL | 11 refills | Status: AC

## 2023-09-29 MED ORDER — LOVASTATIN 20 MG PO TABS: 20.0000 mg | ORAL_TABLET | Freq: Every day | ORAL | 3 refills | Status: AC

## 2023-09-29 NOTE — Assessment & Plan Note (Signed)
  We discussed age appropriate health related issues, including available/recomended screening tests and vaccinations. Labs were ordered to be later reviewed . All questions were answered. We discussed one or more of the following - seat belt use, use of sunscreen/sun exposure exercise, fall risk reduction, second hand smoke exposure, firearm use and storage, seat belt use, a need for adhering to healthy diet and exercise. Labs were ordered.  All questions were answered. Cologuard (-) 2015 2019, 2022. Cologuard due  Shingrix, Prevnar 21, RSV - declined

## 2023-09-29 NOTE — Addendum Note (Signed)
 Addended by: Rebecah Dangerfield V on: 09/29/2023 08:50 AM   Modules accepted: Orders

## 2023-09-29 NOTE — Assessment & Plan Note (Signed)
 Doing well on Lovastatin  20 , Vascepa  Lipitor intolerant

## 2023-09-29 NOTE — Progress Notes (Signed)
 Subjective:  Patient ID: Brendan [name], male    DOB: [birth year]  Age: 73 y.o. MRN: [id number]  CC: Annual Exam   HPI Caellum Mancil Olivar presents for a well exam Doing well on Lovastatin , fish oil   Outpatient Medications Prior to Visit  Medication Sig Dispense Refill   acetaminophen  (TYLENOL ) 500 MG tablet Take 1 tablet (500 mg total) by mouth every 6 (six) hours as needed (pain.). 30 tablet 3   aspirin  EC 81 MG EC tablet Take 1 tablet (81 mg total) by mouth daily.     chlorpheniramine (CHLOR-TRIMETON) 4 MG tablet Take 4 mg by mouth daily.     Cholecalciferol (VITAMIN D3) 50 MCG (2000 UT) TABS 2,000 Units.      hydrocortisone 2.5 % cream Apply 1 application topically daily as needed.     ondansetron  (ZOFRAN ) 4 MG tablet Take 1 tablet (4 mg total) by mouth every 8 (eight) hours as needed for vomiting or nausea. 20 tablet 1   polyethylene glycol powder (GLYCOLAX /MIRALAX ) 17 GM/SCOOP powder Take 17-34 g by mouth 2 (two) times daily as needed.     icosapent  Ethyl (VASCEPA ) 1 g capsule Take 2 capsules (2 g total) by mouth 2 (two) times daily. 120 capsule 11   lansoprazole  (PREVACID ) 30 MG capsule Take 1 capsule (30 mg total) by mouth daily before breakfast. 90 capsule 3   lovastatin  (MEVACOR ) 20 MG tablet Take 1 tablet (20 mg total) by mouth at bedtime. 90 tablet 3   metoprolol  tartrate (LOPRESSOR ) 25 MG tablet Take 0.5 tablets (12.5 mg total) by mouth daily. 45 tablet 3   amoxicillin -clavulanate (AUGMENTIN ) 875-125 MG tablet Take 1 tablet by mouth 2 (two) times daily. 20 tablet 0   No facility-administered medications prior to visit.    ROS: Review of Systems  Constitutional:  Negative for appetite change, fatigue and unexpected weight change.  HENT:  Negative for congestion, nosebleeds, sneezing, sore throat and trouble swallowing.   Eyes:  Negative for itching and visual disturbance.  Respiratory:  Negative for cough.   Cardiovascular:  Negative for chest pain, palpitations and leg  swelling.  Gastrointestinal:  Negative for abdominal distention, blood in stool, diarrhea and nausea.  Genitourinary:  Negative for frequency and hematuria.  Musculoskeletal:  Negative for back pain, gait problem, joint swelling and neck pain.  Skin:  Negative for rash.  Neurological:  Negative for dizziness, tremors, speech difficulty and weakness.  Psychiatric/Behavioral:  Negative for agitation, dysphoric mood and sleep disturbance. The patient is not nervous/anxious.     Objective:  BP 130/82   Pulse 62   Temp 98 F (36.7 C) (Oral)   Ht 5' 9 (1.753 m)   Wt 163 lb (73.9 kg)   SpO2 99%   BMI 24.07 kg/m   BP Readings from Last 3 Encounters:  09/29/23 130/82  05/12/23 118/76  03/13/23 102/60    Wt Readings from Last 3 Encounters:  09/29/23 163 lb (73.9 kg)  05/12/23 162 lb 12.8 oz (73.8 kg)  03/13/23 164 lb (74.4 kg)    Physical Exam Constitutional:      General: He is not in acute distress.    Appearance: Normal appearance. He is well-developed.     Comments: NAD  Eyes:     Conjunctiva/sclera: Conjunctivae normal.     Pupils: Pupils are equal, round, and reactive to light.  Neck:     Thyroid : No thyromegaly.     Vascular: No JVD.  Cardiovascular:     Rate and  Rhythm: Normal rate and regular rhythm.     Heart sounds: Normal heart sounds. No murmur heard.    No friction rub. No gallop.  Pulmonary:     Effort: Pulmonary effort is normal. No respiratory distress.     Breath sounds: Normal breath sounds. No wheezing or rales.  Chest:     Chest wall: No tenderness.  Abdominal:     General: Bowel sounds are normal. There is no distension.     Palpations: Abdomen is soft. There is no mass.     Tenderness: There is no abdominal tenderness. There is no guarding or rebound.  Musculoskeletal:        General: No tenderness. Normal range of motion.     Cervical back: Normal range of motion.  Lymphadenopathy:     Cervical: No cervical adenopathy.  Skin:    General:  Skin is warm and dry.     Findings: No rash.  Neurological:     Mental Status: He is alert and oriented to person, place, and time.     Cranial Nerves: No cranial nerve deficit.     Motor: No abnormal muscle tone.     Coordination: Coordination normal.     Gait: Gait normal.     Deep Tendon Reflexes: Reflexes are normal and symmetric.  Psychiatric:        Behavior: Behavior normal.        Thought Content: Thought content normal.        Judgment: Judgment normal.     Lab Results  Component Value Date   WBC 11.3 (H) 03/06/2023   HGB 15.8 03/06/2023   HCT 48.1 03/06/2023   PLT 214.0 03/06/2023   GLUCOSE 89 05/12/2023   CHOL 171 05/12/2023   TRIG 75.0 05/12/2023   HDL 48.20 05/12/2023   LDLCALC 107 (H) 05/12/2023   ALT 15 05/12/2023   AST 19 05/12/2023   NA 139 05/12/2023   K 4.1 05/12/2023   CL 103 05/12/2023   CREATININE 1.06 05/12/2023   BUN 15 05/12/2023   CO2 31 05/12/2023   TSH 0.99 09/24/2022   PSA 0.01 (L) 09/24/2022   INR 2.1 10/05/2018   HGBA1C 5.1 07/03/2018    US  Abdomen Complete Addendum Date: 03/10/2023 ADDENDUM REPORT: 03/10/2023 10:42 COMPARISON:  Chest CT 01/27/2020 Electronically Signed   By: Franky Crease M.D.   On: 03/10/2023 10:42   Result Date: 03/10/2023 CLINICAL DATA:  Right upper quadrant pain, vomiting EXAM: ABDOMEN ULTRASOUND COMPLETE COMPARISON:  None Available. FINDINGS: Gallbladder: No gallstones or wall thickening visualized. No sonographic Murphy sign noted by sonographer. Common bile duct: Diameter: Normal caliber, 3 mm Liver: 1.8 cm hypoechoic area in the right hepatic dome. This measured up to 2.4 cm on prior chest CT from 2021 compatible with a benign cyst. Diffusely heterogeneous, mildly increased echotexture may reflect fatty infiltration. No biliary ductal dilatation. Portal vein is patent on color Doppler imaging with normal direction of blood flow towards the liver. IVC: No abnormality visualized. Pancreas: Visualized portion unremarkable.  Spleen: Size and appearance within normal limits. Right Kidney: Length: 9.0 cm. Echogenicity within normal limits. No mass or hydronephrosis visualized. Left Kidney: Length: 10.8 cm. Echogenicity within normal limits. No mass or hydronephrosis visualized. Abdominal aorta: No aneurysm visualized. Other findings: None. IMPRESSION: No acute findings.  No evidence of cholelithiasis or cholecystitis. Suspect mild fatty infiltration of the liver. Electronically Signed: By: Franky Crease M.D. On: 03/10/2023 10:39    Assessment & Plan:   Problem List Items  Addressed This Visit     Atherosclerosis of aorta (HCC)   Doing well on Lovastatin  20 , Vascepa  Lipitor intolerant      Relevant Medications   icosapent  Ethyl (VASCEPA ) 1 g capsule   lovastatin  (MEVACOR ) 20 MG tablet   metoprolol  tartrate (LOPRESSOR ) 25 MG tablet   Coronary atherosclerosis   Doing well on Lovastatin  20 , Vascepa  Lipitor intolerant      Relevant Medications   icosapent  Ethyl (VASCEPA ) 1 g capsule   lovastatin  (MEVACOR ) 20 MG tablet   metoprolol  tartrate (LOPRESSOR ) 25 MG tablet   Other Relevant Orders   CBC with Differential/Platelet   Lipid panel   PROSTATE CANCER, HX OF   Relevant Orders   CBC with Differential/Platelet   PSA   Well adult exam - Primary    We discussed age appropriate health related issues, including available/recomended screening tests and vaccinations. Labs were ordered to be later reviewed . All questions were answered. We discussed one or more of the following - seat belt use, use of sunscreen/sun exposure exercise, fall risk reduction, second hand smoke exposure, firearm use and storage, seat belt use, a need for adhering to healthy diet and exercise. Labs were ordered.  All questions were answered. Cologuard (-) 2015 2019, 2022. Cologuard due  Shingrix, Prevnar 21, RSV - declined      Relevant Orders   TSH   Urinalysis   CBC with Differential/Platelet   Lipid panel   PSA   Comprehensive  metabolic panel with GFR   Cologuard   Other Visit Diagnoses       Screening for colon cancer       Relevant Orders   Cologuard         Meds ordered this encounter  Medications   icosapent  Ethyl (VASCEPA ) 1 g capsule    Sig: Take 2 capsules (2 g total) by mouth 2 (two) times daily.    Dispense:  120 capsule    Refill:  11   lansoprazole  (PREVACID ) 30 MG capsule    Sig: Take 1 capsule (30 mg total) by mouth daily before breakfast.    Dispense:  90 capsule    Refill:  3   lovastatin  (MEVACOR ) 20 MG tablet    Sig: Take 1 tablet (20 mg total) by mouth at bedtime.    Dispense:  90 tablet    Refill:  3   metoprolol  tartrate (LOPRESSOR ) 25 MG tablet    Sig: Take 0.5 tablets (12.5 mg total) by mouth daily.    Dispense:  45 tablet    Refill:  3      Follow-up: No follow-ups on file.  Marolyn Noel, MD

## 2023-09-29 NOTE — Addendum Note (Signed)
 Addended by: Lotus Santillo V on: 09/29/2023 08:44 AM   Modules accepted: Level of Service

## 2023-10-01 ENCOUNTER — Ambulatory Visit: Payer: Self-pay | Admitting: Internal Medicine

## 2023-10-01 DIAGNOSIS — R195 Other fecal abnormalities: Secondary | ICD-10-CM

## 2023-10-06 DIAGNOSIS — Z1211 Encounter for screening for malignant neoplasm of colon: Secondary | ICD-10-CM | POA: Diagnosis not present

## 2023-10-09 LAB — COLOGUARD: COLOGUARD: POSITIVE — AB

## 2023-10-15 DIAGNOSIS — L570 Actinic keratosis: Secondary | ICD-10-CM | POA: Diagnosis not present

## 2023-10-15 DIAGNOSIS — Z85828 Personal history of other malignant neoplasm of skin: Secondary | ICD-10-CM | POA: Diagnosis not present

## 2023-10-15 DIAGNOSIS — L720 Epidermal cyst: Secondary | ICD-10-CM | POA: Diagnosis not present

## 2023-10-15 DIAGNOSIS — L565 Disseminated superficial actinic porokeratosis (DSAP): Secondary | ICD-10-CM | POA: Diagnosis not present

## 2023-10-15 DIAGNOSIS — D1801 Hemangioma of skin and subcutaneous tissue: Secondary | ICD-10-CM | POA: Diagnosis not present

## 2023-10-15 DIAGNOSIS — L821 Other seborrheic keratosis: Secondary | ICD-10-CM | POA: Diagnosis not present

## 2023-10-15 DIAGNOSIS — L812 Freckles: Secondary | ICD-10-CM | POA: Diagnosis not present

## 2023-10-15 DIAGNOSIS — L820 Inflamed seborrheic keratosis: Secondary | ICD-10-CM | POA: Diagnosis not present

## 2023-10-23 ENCOUNTER — Encounter: Payer: Self-pay | Admitting: Internal Medicine

## 2023-11-04 ENCOUNTER — Ambulatory Visit (AMBULATORY_SURGERY_CENTER)

## 2023-11-04 VITALS — Ht 69.0 in | Wt 165.4 lb

## 2023-11-04 DIAGNOSIS — R195 Other fecal abnormalities: Secondary | ICD-10-CM

## 2023-11-04 DIAGNOSIS — Z1211 Encounter for screening for malignant neoplasm of colon: Secondary | ICD-10-CM

## 2023-11-04 MED ORDER — NA SULFATE-K SULFATE-MG SULF 17.5-3.13-1.6 GM/177ML PO SOLN: 1.0000 | Freq: Once | ORAL | 0 refills | Status: AC

## 2023-11-04 NOTE — Progress Notes (Signed)
 No issues known to pt with past sedation with any surgeries or procedures Patient denies ever being told they had issues or difficulty with intubation  No FH of Malignant Hyperthermia Pt is not on diet pills Pt is not on home 02  Pt is not on blood thinners  Pt denies issues with chronic constipation  No A fib or A flutter Have any cardiac testing pending--no Pt instructed to use [email or web address] or GoodRx for a price reduction on prep  Ambulates independently

## 2023-11-19 ENCOUNTER — Encounter: Payer: Self-pay | Admitting: Internal Medicine

## 2023-11-19 ENCOUNTER — Ambulatory Visit (AMBULATORY_SURGERY_CENTER): Admitting: Internal Medicine

## 2023-11-19 VITALS — BP 117/71 | HR 63 | Temp 97.5°F | Resp 11 | Ht 69.0 in | Wt 165.0 lb

## 2023-11-19 DIAGNOSIS — Z1211 Encounter for screening for malignant neoplasm of colon: Secondary | ICD-10-CM | POA: Diagnosis not present

## 2023-11-19 DIAGNOSIS — Z860100 Personal history of colon polyps, unspecified: Secondary | ICD-10-CM

## 2023-11-19 DIAGNOSIS — R195 Other fecal abnormalities: Secondary | ICD-10-CM

## 2023-11-19 DIAGNOSIS — D122 Benign neoplasm of ascending colon: Secondary | ICD-10-CM | POA: Diagnosis not present

## 2023-11-19 DIAGNOSIS — Z860101 Personal history of adenomatous and serrated colon polyps: Secondary | ICD-10-CM | POA: Diagnosis not present

## 2023-11-19 MED ORDER — SODIUM CHLORIDE 0.9 % IV SOLN: 500.0000 mL | Freq: Once | INTRAVENOUS | Status: DC

## 2023-11-19 NOTE — Progress Notes (Signed)
 Called to room to assist during endoscopic procedure.  Patient ID and intended procedure confirmed with present staff. Received instructions for my participation in the procedure from the performing physician.

## 2023-11-19 NOTE — Patient Instructions (Signed)

## 2023-11-19 NOTE — Progress Notes (Signed)
 Pt sedate, gd SR's, VSS, report to RN

## 2023-11-19 NOTE — Op Note (Signed)
 [facility] Patient Name: Brendan [name] Procedure Date: 11/19/2023 10:12 AM MRN: [id number] Endoscopist: Norleen SAILOR. Abran , MD, [id number] Age: 73 Referring MD:  Date of Birth: March 30, 1950 Gender: Male Account #: [id number] Procedure:                Colonoscopy with cold snare polypectomy x 1 Indications:              High risk colon cancer surveillance: Personal                            history of colonic polyps, High risk colon cancer                            surveillance: Personal history of non-advanced                            adenoma. Previous examinations 2000, 2005. Sent for                            colonoscopy after Cologuard testing returned +                            September 2025 Medicines:                Monitored Anesthesia Care Procedure:                Pre-Anesthesia Assessment:                           - Prior to the procedure, a History and Physical                            was performed, and patient medications and                            allergies were reviewed. The patient's tolerance of                            previous anesthesia was also reviewed. The risks                            and benefits of the procedure and the sedation                            options and risks were discussed with the patient.                            All questions were answered, and informed consent                            was obtained. Prior Anticoagulants: The patient has                            taken no anticoagulant or antiplatelet agents. ASA  Grade Assessment: II - A patient with mild systemic                            disease. After reviewing the risks and benefits,                            the patient was deemed in satisfactory condition to                            undergo the procedure.                           After obtaining informed consent, the colonoscope                            was passed under  direct vision. Throughout the                            procedure, the patient's blood pressure, pulse, and                            oxygen saturations were monitored continuously. The                            CF [id number] #[id number] was introduced through the anus                            and advanced to the the cecum, identified by                            appendiceal orifice and ileocecal valve. The                            ileocecal valve, appendiceal orifice, and rectum                            were photographed. The quality of the bowel                            preparation was excellent. The colonoscopy was                            performed without difficulty. The patient tolerated                            the procedure well. The bowel preparation used was                            SUPREP via split dose instruction. Scope In: 10:13:14 AM Scope Out: 10:35:17 AM Scope Withdrawal Time: 0 hours 8 minutes 36 seconds  Total Procedure Duration: 0 hours 22 minutes 3 seconds  Findings:                 A 5 mm polyp was found in the ascending colon.  The                            polyp was removed with a cold snare. Resection and                            retrieval were complete.                           The exam was otherwise without abnormality on                            direct and retroflexion views. Complications:            No immediate complications. Estimated blood loss:                            None. Estimated Blood Loss:     Estimated blood loss: none. Impression:               - One 5 mm polyp in the ascending colon, removed                            with a cold snare. Resected and retrieved.                           - The examination was otherwise normal on direct                            and retroflexion views. Recommendation:           - Repeat colonoscopy is not recommended for                            surveillance.                           -  Patient has a contact number available for                            emergencies. The signs and symptoms of potential                            delayed complications were discussed with the                            patient. Return to normal activities tomorrow.                            Written discharge instructions were provided to the                            patient.                           - Resume previous diet.                           -  Continue present medications.                           - Await pathology results. Norleen SAILOR. Abran, MD 11/19/2023 10:43:38 AM This report has been signed electronically.

## 2023-11-19 NOTE — Progress Notes (Signed)
 Pt's states no medical or surgical changes since previsit or office visit.

## 2023-11-19 NOTE — Progress Notes (Signed)
 HISTORY OF PRESENT ILLNESS:  Brendan [name] is a 73 y.o. male with a history of nonadvanced adenomatous colon polyp 2000.  Negative exam 2005.  Sent now for colonoscopy after testing positive via Cologuard October 06, 2023  REVIEW OF SYSTEMS:  All non-GI ROS negative except for  Past Medical History:  Diagnosis Date   Cancer [facility])    prostate.  skin basal cell   Decreased hearing    ED (erectile dysfunction)    GERD (gastroesophageal reflux disease)    Heart murmur    Hyperlipidemia    IBS (irritable bowel syndrome)    Incidental pulmonary nodule, > 3mm and < 8mm 07/02/2018   Right lung - needs f/u CT 6 months   MVP (mitral valve prolapse)    Nausea & vomiting 03/06/2023   Nonrheumatic mitral valve regurgitation    Ruptured cervical disc 2008   S/P minimally invasive mitral valve repair 07/08/2018   Complex valvuloplasty including triangular resection of posterior leaflet, artificial Gore-tex neochord placement x6 and 30 mm Sorin Memo 4D ring annuloplasty via right mini thoracotomy approach    Past Surgical History:  Procedure Laterality Date   ACHILLES TENDON SURGERY  1980   prosthesis   AMPUTATION FINGER / THUMB Right 12/2004   tip of thumb for strep infection   EYE SURGERY  2022   Cataract bilateral   MITRAL VALVE REPAIR Right 07/08/2018   Procedure: MINIMALLY INVASIVE MITRAL VALVE REPAIR (MVR);  Surgeon: Brendan Sudie DEL, MD;  Location: [facility] OR;  Service: Open Heart Surgery;  Laterality: Right;  GLUTARALDEHYDE   PROSTATECTOMY  2007   RIGHT/LEFT HEART CATH AND CORONARY ANGIOGRAPHY N/A 06/17/2018   Procedure: RIGHT/LEFT HEART CATH AND CORONARY ANGIOGRAPHY;  Surgeon: Brendan Sharper, MD;  Location: [facility] INVASIVE CV LAB;  Service: Cardiovascular;  Laterality: N/A;   TEE WITHOUT CARDIOVERSION N/A 04/06/2018   Procedure: TRANSESOPHAGEAL ECHOCARDIOGRAM (TEE);  Surgeon: Brendan Headland, MD;  Location: [facility] ENDOSCOPY;  Service: Cardiovascular;  Laterality: N/A;   TEE WITHOUT  CARDIOVERSION N/A 07/08/2018   Procedure: TRANSESOPHAGEAL ECHOCARDIOGRAM (TEE);  Surgeon: Brendan Sudie DEL, MD;  Location: [facility] OR;  Service: Open Heart Surgery;  Laterality: N/A;   TONSILLECTOMY      Social History Aldona Bryner C Gren  reports that he has never smoked. He has never used smokeless tobacco. He reports that he does not drink alcohol and does not use drugs.  family history includes Heart disease in his mother; Heart disease (age of onset: 19) in his father; Hyperlipidemia in an other family member.  Allergies  Allergen Reactions   Atorvastatin  Nausea And Vomiting    Nausea, vomiting. He can take Lovastatin  ok   Hydrocodone Other (See Comments)    chills       PHYSICAL EXAMINATION: Vital signs: BP (!) 148/92   Pulse 73   Temp (!) 97.5 F (36.4 C)   Ht 5' 9 (1.753 m)   Wt 165 lb (74.8 kg)   SpO2 99%   BMI 24.37 kg/m  General: Well-developed, well-nourished, no acute distress HEENT: Sclerae are anicteric, conjunctiva pink. Oral mucosa intact Lungs: Clear Heart: Regular Abdomen: soft, nontender, nondistended, no obvious ascites, no peritoneal signs, normal bowel sounds. No organomegaly. Extremities: No edema Psychiatric: alert and oriented x3. Cooperative     ASSESSMENT:  History of adenomatous polyp Positive Cologuard testing   PLAN: Colonoscopy

## 2023-11-20 ENCOUNTER — Telehealth: Payer: Self-pay

## 2023-11-20 NOTE — Telephone Encounter (Signed)
  Follow up Call-     11/19/2023    9:12 AM  Call back number  Post procedure Call Back phone  # [phone]  Permission to leave phone message Yes     Patient questions:  Do you have a fever, pain , or abdominal swelling? No. Pain Score  0 *  Have you tolerated food without any problems? Yes.    Have you been able to return to your normal activities? Yes.    Do you have any questions about your discharge instructions: Diet   No. Medications  No. Follow up visit  No.  Do you have questions or concerns about your Care? No.  Actions: * If pain score is 4 or above: No action needed, pain <4.

## 2023-11-21 ENCOUNTER — Ambulatory Visit: Payer: Self-pay | Admitting: Internal Medicine

## 2023-11-21 LAB — SURGICAL PATHOLOGY

## 2024-01-30 ENCOUNTER — Other Ambulatory Visit (HOSPITAL_BASED_OUTPATIENT_CLINIC_OR_DEPARTMENT_OTHER): Payer: Self-pay

## 2024-01-30 ENCOUNTER — Ambulatory Visit (HOSPITAL_BASED_OUTPATIENT_CLINIC_OR_DEPARTMENT_OTHER)
Admission: RE | Admit: 2024-01-30 | Discharge: 2024-01-30 | Disposition: A | Discharge status: Home / Self Care | Source: Ambulatory Visit | Attending: Family Medicine | Admitting: Family Medicine

## 2024-01-30 ENCOUNTER — Encounter (HOSPITAL_BASED_OUTPATIENT_CLINIC_OR_DEPARTMENT_OTHER): Payer: Self-pay

## 2024-01-30 VITALS — BP 170/93 | HR 69 | Temp 98.2°F | Resp 16

## 2024-01-30 DIAGNOSIS — J0140 Acute pansinusitis, unspecified: Secondary | ICD-10-CM

## 2024-01-30 DIAGNOSIS — J3489 Other specified disorders of nose and nasal sinuses: Secondary | ICD-10-CM | POA: Diagnosis not present

## 2024-01-30 MED ORDER — FLUTICASONE PROPIONATE 50 MCG/ACT NA SUSP
1.0000 | Freq: Two times a day (BID) | NASAL | 0 refills | Status: AC | PRN
  Filled 2024-01-30: qty 16, 30d supply, fill #0

## 2024-01-30 MED ORDER — AMOXICILLIN 875 MG PO TABS
875.0000 mg | ORAL_TABLET | Freq: Two times a day (BID) | ORAL | 0 refills | Status: AC
  Filled 2024-01-30: qty 20, 10d supply, fill #0

## 2024-01-30 NOTE — Discharge Instructions (Addendum)
 Sinusitis with facial pressure/sinus pressure and headache: Get plenty of fluids and rest.  Encourage sinus rinses twice daily.  Amoxicillin  875 mg twice daily for 10 days.  Fluticasone  nasal spray, 1 spray into each nostril twice daily for 10 days.  If the fluticasone  drips blot it with the tissue but do not sniffle nor snort.  Follow-up here with primary care if symptoms do not improve, worsen or new symptoms occur.

## 2024-01-30 NOTE — ED Provider Notes (Signed)
 " [facility]    CSN: [id number] Arrival date & time: 01/30/24  1056      History   Chief Complaint Chief Complaint  Patient presents with   Nasal Congestion    Entered by patient    HPI Brendan [name] is a 72 y.o. male.   73 year old male with complaint of nasal congestion, headache, dry cough, watery eyes.  He reports nasal congestion since approximately 01/19/2024 or earlier.  He developed the significant nasal discharge in the watery eyes on the night of 01/28/2024.  He has taken acetaminophen  with some relief of the headache.  His wife has a sinus infection and he reports he gets sinus infections about once a year.     Past Medical History:  Diagnosis Date   Cancer [facility])    prostate.  skin basal cell   Decreased hearing    ED (erectile dysfunction)    GERD (gastroesophageal reflux disease)    Heart murmur    Hyperlipidemia    IBS (irritable bowel syndrome)    Incidental pulmonary nodule, > 3mm and < 8mm 07/02/2018   Right lung - needs f/u CT 6 months   MVP (mitral valve prolapse)    Nausea & vomiting 03/06/2023   Nonrheumatic mitral valve regurgitation    Ruptured cervical disc 2008   S/P minimally invasive mitral valve repair 07/08/2018   Complex valvuloplasty including triangular resection of posterior leaflet, artificial Gore-tex neochord placement x6 and 30 mm Sorin Memo 4D ring annuloplasty via right mini thoracotomy approach    Patient Active Problem List   Diagnosis Date Noted   Coronary atherosclerosis 03/13/2023   Constipation 03/13/2023   RUQ abdominal pain 03/06/2023   Nausea & vomiting 03/06/2023   Nasal congestion 01/20/2022   Atherosclerosis of aorta 09/14/2020   Sinusitis 06/25/2020   Absolute anemia 09/24/2018   Anticoagulated 09/24/2018   Long term (current) use of anticoagulants 07/16/2018   S/P minimally invasive mitral valve repair 07/08/2018   Incidental pulmonary nodule, > 3mm and < 8mm 07/02/2018   Tick bite 06/25/2018    Nonrheumatic mitral valve regurgitation    Heart murmur 03/02/2018   Leg pain, inferior, right 03/02/2018   External otitis of left ear 07/15/2017   Viral URI 03/29/2015   Cellulitis of left buttock 01/18/2013   Hearing loss of aging 08/03/2012   Well adult exam 09/02/2011   Acute conjunctivitis, left eye 07/16/2011   FEVER UNSPECIFIED 04/11/2009   Cough 04/11/2009   SINUSITIS, ACUTE 03/27/2009   Dyslipidemia 07/28/2007   Allergic rhinitis 07/28/2007   GERD 07/28/2007   ERECTILE DYSFUNCTION 07/28/2007   PROSTATE CANCER, HX OF 07/28/2007   SKIN CANCER, HX OF 07/28/2007    Past Surgical History:  Procedure Laterality Date   ACHILLES TENDON SURGERY  1980   prosthesis   AMPUTATION FINGER / THUMB Right 12/2004   tip of thumb for strep infection   EYE SURGERY  2022   Cataract bilateral   MITRAL VALVE REPAIR Right 07/08/2018   Procedure: MINIMALLY INVASIVE MITRAL VALVE REPAIR (MVR);  Surgeon: Dusty Sudie DEL, MD;  Location: [facility] OR;  Service: Open Heart Surgery;  Laterality: Right;  GLUTARALDEHYDE   PROSTATECTOMY  2007   RIGHT/LEFT HEART CATH AND CORONARY ANGIOGRAPHY N/A 06/17/2018   Procedure: RIGHT/LEFT HEART CATH AND CORONARY ANGIOGRAPHY;  Surgeon: Wonda Sharper, MD;  Location: [facility] INVASIVE CV LAB;  Service: Cardiovascular;  Laterality: N/A;   TEE WITHOUT CARDIOVERSION N/A 04/06/2018   Procedure: TRANSESOPHAGEAL ECHOCARDIOGRAM (TEE);  Surgeon: Francyne Headland,  MD;  Location: [facility] ENDOSCOPY;  Service: Cardiovascular;  Laterality: N/A;   TEE WITHOUT CARDIOVERSION N/A 07/08/2018   Procedure: TRANSESOPHAGEAL ECHOCARDIOGRAM (TEE);  Surgeon: Dusty Sudie DEL, MD;  Location: [facility] OR;  Service: Open Heart Surgery;  Laterality: N/A;   TONSILLECTOMY         Home Medications    Prior to Admission medications  Medication Sig Start Date End Date Taking? Authorizing Provider  amoxicillin  (AMOXIL ) 875 MG tablet Take 1 tablet (875 mg total) by mouth 2 (two) times daily for 10 days. 01/30/24  02/09/24 Yes Ival Domino, FNP  fluticasone  (FLONASE ) 50 MCG/ACT nasal spray Place 1 spray into both nostrils 2 (two) times daily as needed for rhinitis. 01/30/24 02/29/24 Yes Ival Domino, FNP  acetaminophen  (TYLENOL ) 500 MG tablet Take 1 tablet (500 mg total) by mouth every 6 (six) hours as needed (pain.). 07/28/19   Plotnikov, Aleksei V, MD  ascorbic acid (VITAMIN C) 500 MG tablet Take 500 mg by mouth daily.    [provider]  aspirin  EC 81 MG EC tablet Take 1 tablet (81 mg total) by mouth daily. 07/13/18   Roddenberry, Myron G, PA-C  chlorpheniramine (CHLOR-TRIMETON) 4 MG tablet Take 4 mg by mouth daily.    [provider]  Cholecalciferol (VITAMIN D3) 50 MCG (2000 UT) TABS 2,000 Units.  09/05/18   [provider]  icosapent  Ethyl (VASCEPA ) 1 g capsule Take 2 capsules (2 g total) by mouth 2 (two) times daily. Patient taking differently: Take 1 g by mouth 2 (two) times daily. 09/29/23   Plotnikov, Aleksei V, MD  lansoprazole  (PREVACID ) 30 MG capsule Take 1 capsule (30 mg total) by mouth daily before breakfast. 09/29/23   Plotnikov, Aleksei V, MD  lovastatin  (MEVACOR ) 20 MG tablet Take 1 tablet (20 mg total) by mouth at bedtime. 09/29/23   Plotnikov, Aleksei V, MD  metoprolol  tartrate (LOPRESSOR ) 25 MG tablet Take 0.5 tablets (12.5 mg total) by mouth daily. 09/29/23   Plotnikov, Karlynn GAILS, MD    Family History Family History  Problem Relation Age of Onset   Heart disease Mother        arrhythmia, angina   Heart disease Father 40       CAD, MVR   Hyperlipidemia Other    Colon cancer Neg Hx    Esophageal cancer Neg Hx    Rectal cancer Neg Hx    Stomach cancer Neg Hx     Social History Social History[1]   Allergies   Atorvastatin  and Hydrocodone   Review of Systems Review of Systems  Constitutional:  Negative for chills and fever.  HENT:  Positive for congestion, postnasal drip, rhinorrhea, sinus pressure and sinus pain. Negative for ear pain and sore throat.    Eyes:  Positive for discharge. Negative for pain and visual disturbance.  Respiratory:  Positive for cough.   Cardiovascular:  Negative for chest pain and palpitations.  Gastrointestinal:  Negative for abdominal pain, constipation, diarrhea, nausea and vomiting.  Genitourinary:  Negative for dysuria and hematuria.  Musculoskeletal:  Negative for arthralgias and back pain.  Skin:  Negative for color change and rash.  Neurological:  Positive for headaches. Negative for seizures and syncope.  All other systems reviewed and are negative.    Physical Exam Triage Vital Signs ED Triage Vitals [01/30/24 1143]  Encounter Vitals Group     BP      Girls Systolic BP Percentile      Girls Diastolic BP Percentile  Boys Systolic BP Percentile      Boys Diastolic BP Percentile      Pulse      Resp      Temp      Temp src      SpO2      Weight      Height      Head Circumference      Peak Flow      Pain Score 0     Pain Loc      Pain Education      Exclude from Growth Chart    No data found.  Updated Vital Signs BP (!) 170/93 (BP Location: Right Arm)   Pulse 69   Temp 98.2 F (36.8 C) (Oral)   Resp 16   SpO2 95%   Visual Acuity Right Eye Distance:   Left Eye Distance:   Bilateral Distance:    Right Eye Near:   Left Eye Near:    Bilateral Near:     Physical Exam Vitals and nursing note reviewed.  Constitutional:      General: He is not in acute distress.    Appearance: He is well-developed. He is not ill-appearing, toxic-appearing or diaphoretic.  HENT:     Head: Normocephalic and atraumatic.     Right Ear: Hearing, tympanic membrane, ear canal and external ear normal.     Left Ear: Hearing, tympanic membrane, ear canal and external ear normal.     Nose: Congestion and rhinorrhea present. Rhinorrhea is clear.     Right Sinus: Maxillary sinus tenderness and frontal sinus tenderness present.     Left Sinus: Maxillary sinus tenderness and frontal sinus tenderness  present.     Mouth/Throat:     Lips: Pink.     Mouth: Mucous membranes are moist.     Pharynx: Uvula midline. No oropharyngeal exudate or posterior oropharyngeal erythema.     Tonsils: No tonsillar exudate.  Eyes:     Conjunctiva/sclera: Conjunctivae normal.     Pupils: Pupils are equal, round, and reactive to light.  Cardiovascular:     Rate and Rhythm: Normal rate and regular rhythm.     Heart sounds: S1 normal and S2 normal. No murmur heard. Pulmonary:     Effort: Pulmonary effort is normal. No respiratory distress.     Breath sounds: Normal breath sounds. No decreased breath sounds, wheezing, rhonchi or rales.  Abdominal:     General: Bowel sounds are normal.     Palpations: Abdomen is soft.     Tenderness: There is no abdominal tenderness.  Musculoskeletal:        General: No swelling.     Cervical back: Neck supple.  Lymphadenopathy:     Head:     Right side of head: No submental, submandibular, tonsillar, preauricular or posterior auricular adenopathy.     Left side of head: No submental, submandibular, tonsillar, preauricular or posterior auricular adenopathy.     Cervical: No cervical adenopathy.     Right cervical: No superficial cervical adenopathy.    Left cervical: No superficial cervical adenopathy.  Skin:    General: Skin is warm and dry.     Capillary Refill: Capillary refill takes less than 2 seconds.     Findings: No rash.  Neurological:     Mental Status: He is alert and oriented to person, place, and time.  Psychiatric:        Mood and Affect: Mood normal.      UC Treatments / Results  Labs (all labs ordered are listed, but only abnormal results are displayed) Labs Reviewed - No data to display  EKG   Radiology No results found.  Procedures Procedures (including critical care time)  Medications Ordered in UC Medications - No data to display  Initial Impression / Assessment and Plan / UC Course  I have reviewed the triage vital signs and  the nursing notes.  Pertinent labs & imaging results that were available during my care of the patient were reviewed by me and considered in my medical decision making (see chart for details).  Plan of Care (see discharge instructions for additional patient precautions and education): Sinusitis with facial pressure/sinus pressure and headache: Get plenty of fluids and rest.  Encourage sinus rinses twice daily.  Amoxicillin  875 mg twice daily for 10 days.  Fluticasone  nasal spray, 1 spray into each nostril twice daily for 10 days.  If the fluticasone  drips blot it with the tissue but do not sniffle nor snort.  Follow-up here with primary care if symptoms do not improve, worsen or new symptoms occur.  I reviewed the plan of care with the patient and/or the patient's guardian.  The patient and/or guardian had time to ask questions and acknowledged that the questions were answered.  Final Clinical Impressions(s) / UC Diagnoses   Final diagnoses:  Acute non-recurrent pansinusitis  Sinus pressure     Discharge Instructions      Sinusitis with facial pressure/sinus pressure and headache: Get plenty of fluids and rest.  Encourage sinus rinses twice daily.  Amoxicillin  875 mg twice daily for 10 days.  Fluticasone  nasal spray, 1 spray into each nostril twice daily for 10 days.  If the fluticasone  drips blot it with the tissue but do not sniffle nor snort.  Follow-up here with primary care if symptoms do not improve, worsen or new symptoms occur.     ED Prescriptions     Medication Sig Dispense Auth. Provider   amoxicillin  (AMOXIL ) 875 MG tablet Take 1 tablet (875 mg total) by mouth 2 (two) times daily for 10 days. 20 tablet Basheer Molchan, FNP   fluticasone  (FLONASE ) 50 MCG/ACT nasal spray Place 1 spray into both nostrils 2 (two) times daily as needed for rhinitis. 16 g Ival Domino, FNP      PDMP not reviewed this encounter.    [1]  Social History Tobacco Use   Smoking status: Never    Smokeless tobacco: Never  Vaping Use   Vaping status: Never Used  Substance Use Topics   Alcohol use: No    Comment: occasionally   Drug use: No     Ival Domino, FNP 01/30/24 1201  "

## 2024-01-30 NOTE — ED Triage Notes (Signed)
 Patient here today with c/o nasal congestion, headache, dry cough, and watery eyes since Wednesday. Patient has been taking Tylenol  with some relief of the headache. His wife has a sinus infection and he states that he usually gets a sinus infection annually.

## 2024-02-21 NOTE — Progress Notes (Unsigned)
" °  Cardiology Office Note:   Date:  02/23/2024  ID:  Brendan [name], DOB [birth year], MRN [id number] PCP: Garald Karlynn GAILS, MD  [facility] Providers Cardiologist:  Lynwood Schilling, MD {  History of Present Illness:   Brendan [name] is a 74 y.o. male who is referred by Plotnikov, Karlynn GAILS, MD male with who presents for follow up after minimally invasive MVR.   This was stable on echo in Sept 2022.  Since I last saw him he has done OK.   The patient denies any new symptoms such as chest discomfort, neck or arm discomfort. There has been no new shortness of breath, PND or orthopnea. There have been no reported palpitations, presyncope or syncope.  He tries to do a little bit of walking but then admits to the fact that he is not doing this very much.  However, with what he does he gets no cardiovascular symptoms.  He does not household chores.  ROS: As stated in the HPI and negative for all other systems.  Studies Reviewed:    EKG:   EKG Interpretation Date/Time:  Monday February 23 2024 09:16:58 EST Ventricular Rate:  68 PR Interval:  164 QRS Duration:  92 QT Interval:  402 QTC Calculation: 427 R Axis:   70  Text Interpretation: Normal sinus rhythm Possible Left atrial enlargement When compared with ECG of 13-Jan-2023 09:59, No significant change since last tracing Poor anterior R wave progression and possible LAE likely related to lead placement. Confirmed by Schilling Rattan ([id number]) on 02/23/2024 9:31:16 AM    Risk Assessment/Calculations:         Physical Exam:   VS:  BP (!) 173/92 (BP Location: Left Arm, Patient Position: Sitting)   Pulse 68   Ht 5' 9 (1.753 m)   Wt 163 lb 3.2 oz (74 kg)   SpO2 96%   BMI 24.10 kg/m    Wt Readings from Last 3 Encounters:  02/23/24 163 lb 3.2 oz (74 kg)  11/19/23 165 lb (74.8 kg)  11/04/23 165 lb 6.4 oz (75 kg)     GEN: Well nourished, well developed in no acute distress NECK: No JVD; No carotid bruits CARDIAC: RRR, 2 out of 6  holosystolic murmur heard at the apex and radiating slightly to the axilla, no diastolic murmurs, rubs, gallops RESPIRATORY:  Clear to auscultation without rales, wheezing or rhonchi  ABDOMEN: Soft, non-tender, non-distended EXTREMITIES:  No edema; No deformity   ASSESSMENT AND PLAN:   MV Repair:   This was stable on echo in Dec 2024.  I will repeat an echocardiogram in December of this year and he understands endocarditis prophylaxis.    Dyslipidemia:  LDL was 96 in August with an HDL of 53.  No change in therapy.    Hypertension: His blood pressure is not well-controlled on the last 2 readings and so I am going to add amlodipine  2.5 mg daily and he is going to keep a blood pressure diary.  Follow up with me in 1 year.  Signed, Lynwood Schilling, MD   "

## 2024-02-23 ENCOUNTER — Encounter: Payer: Self-pay | Admitting: Cardiology

## 2024-02-23 ENCOUNTER — Other Ambulatory Visit (HOSPITAL_COMMUNITY): Payer: Self-pay

## 2024-02-23 ENCOUNTER — Ambulatory Visit: Admitting: Cardiology

## 2024-02-23 VITALS — BP 173/92 | HR 68 | Ht 69.0 in | Wt 163.2 lb

## 2024-02-23 DIAGNOSIS — E785 Hyperlipidemia, unspecified: Secondary | ICD-10-CM | POA: Insufficient documentation

## 2024-02-23 DIAGNOSIS — Z9889 Other specified postprocedural states: Secondary | ICD-10-CM | POA: Diagnosis not present

## 2024-02-23 DIAGNOSIS — I340 Nonrheumatic mitral (valve) insufficiency: Secondary | ICD-10-CM | POA: Insufficient documentation

## 2024-02-23 MED ORDER — AMLODIPINE BESYLATE 2.5 MG PO TABS
2.5000 mg | ORAL_TABLET | Freq: Every day | ORAL | 3 refills | Status: AC
  Filled 2024-02-23: qty 90, 90d supply, fill #0

## 2024-02-23 NOTE — Patient Instructions (Signed)
 Medication Instructions:  Start Amlodipine  2.5 mg once daily *If you need a refill on your cardiac medications before your next appointment, please call your pharmacy*  Lab Work: NONE If you have labs (blood work) drawn today and your tests are completely normal, you will receive your results only by: MyChart Message (if you have MyChart) OR A paper copy in the mail If you have any lab test that is abnormal or we need to change your treatment, we will call you to review the results.  Testing/Procedures: Echocardiogram in Jan 2027  Follow-Up: At [facility], you and your health needs are our priority.  As part of our continuing mission to provide you with exceptional heart care, our providers are all part of one team.  This team includes your primary Cardiologist (physician) and Advanced Practice Providers or APPs (Physician Assistants and Nurse Practitioners) who all work together to provide you with the care you need, when you need it.  Your next appointment:   1 year(s)  Provider:   Lynwood Schilling, MD    We recommend signing up for the patient portal called MyChart.  Sign up information is provided on this After Visit Summary.  MyChart is used to connect with patients for Virtual Visits (Telemedicine).  Patients are able to view lab/test results, encounter notes, upcoming appointments, etc.  Non-urgent messages can be sent to your provider as well.   To learn more about what you can do with MyChart, go to [email or web address].

## 2024-03-09 ENCOUNTER — Encounter: Payer: Self-pay | Admitting: Internal Medicine

## 2024-03-09 ENCOUNTER — Ambulatory Visit: Admitting: Internal Medicine

## 2024-03-09 ENCOUNTER — Ambulatory Visit: Payer: Self-pay | Admitting: Internal Medicine

## 2024-03-09 VITALS — BP 152/94 | HR 67 | Ht 69.0 in | Wt 163.4 lb

## 2024-03-09 DIAGNOSIS — K5900 Constipation, unspecified: Secondary | ICD-10-CM

## 2024-03-09 DIAGNOSIS — E785 Hyperlipidemia, unspecified: Secondary | ICD-10-CM

## 2024-03-09 DIAGNOSIS — K219 Gastro-esophageal reflux disease without esophagitis: Secondary | ICD-10-CM

## 2024-03-09 DIAGNOSIS — R1114 Bilious vomiting: Secondary | ICD-10-CM

## 2024-03-09 LAB — CBC WITH DIFFERENTIAL/PLATELET
Basophils Absolute: 0.1 10*3/uL (ref 0.0–0.1)
Basophils Relative: 1.2 % (ref 0.0–3.0)
Eosinophils Absolute: 0.1 10*3/uL (ref 0.0–0.7)
Eosinophils Relative: 2.5 % (ref 0.0–5.0)
HCT: 42.4 % (ref 39.0–52.0)
Hemoglobin: 14.4 g/dL (ref 13.0–17.0)
Lymphocytes Relative: 31.3 % (ref 12.0–46.0)
Lymphs Abs: 1.6 10*3/uL (ref 0.7–4.0)
MCHC: 33.9 g/dL (ref 30.0–36.0)
MCV: 88.4 fl (ref 78.0–100.0)
Monocytes Absolute: 0.3 10*3/uL (ref 0.1–1.0)
Monocytes Relative: 5.6 % (ref 3.0–12.0)
Neutro Abs: 3 10*3/uL (ref 1.4–7.7)
Neutrophils Relative %: 59.4 % (ref 43.0–77.0)
Platelets: 198 10*3/uL (ref 150.0–400.0)
RBC: 4.79 Mil/uL (ref 4.22–5.81)
RDW: 13.7 % (ref 11.5–15.5)
WBC: 5.1 10*3/uL (ref 4.0–10.5)

## 2024-03-09 LAB — COMPREHENSIVE METABOLIC PANEL WITH GFR
ALT: 12 U/L (ref 3–53)
AST: 18 U/L (ref 5–37)
Albumin: 4.5 g/dL (ref 3.5–5.2)
Alkaline Phosphatase: 53 U/L (ref 39–117)
BUN: 12 mg/dL (ref 6–23)
CO2: 29 meq/L (ref 19–32)
Calcium: 9.2 mg/dL (ref 8.4–10.5)
Chloride: 104 meq/L (ref 96–112)
Creatinine, Ser: 1.12 mg/dL (ref 0.40–1.50)
GFR: 64.38 mL/min
Glucose, Bld: 86 mg/dL (ref 70–99)
Potassium: 3.8 meq/L (ref 3.5–5.1)
Sodium: 141 meq/L (ref 135–145)
Total Bilirubin: 0.8 mg/dL (ref 0.2–1.2)
Total Protein: 6.6 g/dL (ref 6.0–8.3)

## 2024-03-09 LAB — LIPASE: Lipase: 8 U/L — ABNORMAL LOW (ref 11.0–59.0)

## 2024-03-09 MED ORDER — PANTOPRAZOLE SODIUM 40 MG PO TBEC: 40.0000 mg | DELAYED_RELEASE_TABLET | Freq: Two times a day (BID) | ORAL | 3 refills | Status: AC

## 2024-03-09 MED ORDER — ONDANSETRON 4 MG PO TBDP: 4.0000 mg | ORAL_TABLET | Freq: Three times a day (TID) | ORAL | 0 refills | Status: AC | PRN

## 2024-03-09 NOTE — Assessment & Plan Note (Addendum)
 Relapsed Pericolace one twice a day Miralax  1-2 scoop in water a day Ref to see Dr Perry Abd CT, repeat labs

## 2024-03-09 NOTE — Patient Instructions (Signed)
 Pericolace one twice a day Miralax  1-2 scoop in water a day

## 2024-03-09 NOTE — Assessment & Plan Note (Addendum)
 Worse Start Protonix  40 mg bid. D/c Prevacid  TUMs prn Ref to see Dr Abran Abd CT Hold Lovastatin , fish oil

## 2024-03-09 NOTE — Assessment & Plan Note (Addendum)
 Relapsed. Worse Start Protonix  40 mg bid TUMs prn Ref to see Dr Perry Abd CT, repeat labs Zofran  prn Treat constipation Hold Lovastatin , fish oil

## 2024-03-11 ENCOUNTER — Inpatient Hospital Stay: Admission: RE | Admit: 2024-03-11 | Discharge: 2024-03-11 | Attending: Internal Medicine | Admitting: Internal Medicine

## 2024-03-11 DIAGNOSIS — R1114 Bilious vomiting: Secondary | ICD-10-CM

## 2024-03-11 DIAGNOSIS — K5900 Constipation, unspecified: Secondary | ICD-10-CM

## 2024-03-11 MED ORDER — IOPAMIDOL (ISOVUE-300) INJECTION 61%
100.0000 mL | Freq: Once | INTRAVENOUS | Status: AC | PRN
  Administered 2024-03-11: 100 mL via INTRAVENOUS

## 2024-04-02 ENCOUNTER — Ambulatory Visit: Admitting: Internal Medicine

## 2024-04-22 ENCOUNTER — Ambulatory Visit: Admitting: Internal Medicine

## 2024-09-29 ENCOUNTER — Ambulatory Visit: Admitting: Internal Medicine
